# Patient Record
Sex: Male | Born: 1950 | Race: White | Hispanic: No | Smoking: Never smoker
Health system: Southern US, Community
[De-identification: ages and names within clinical notes are randomized; demographics above are authoritative.]

## PROBLEM LIST (undated history)

## (undated) ENCOUNTER — Emergency Department: Payer: 59

## (undated) DIAGNOSIS — N400 Enlarged prostate without lower urinary tract symptoms: Secondary | ICD-10-CM

## (undated) DIAGNOSIS — G20 Parkinson's disease: Secondary | ICD-10-CM

## (undated) DIAGNOSIS — G20A1 Parkinson's disease without dyskinesia, without mention of fluctuations: Secondary | ICD-10-CM

## (undated) DIAGNOSIS — I429 Cardiomyopathy, unspecified: Secondary | ICD-10-CM

## (undated) DIAGNOSIS — I2510 Atherosclerotic heart disease of native coronary artery without angina pectoris: Secondary | ICD-10-CM

## (undated) DIAGNOSIS — E039 Hypothyroidism, unspecified: Secondary | ICD-10-CM

## (undated) DIAGNOSIS — I10 Essential (primary) hypertension: Secondary | ICD-10-CM

## (undated) DIAGNOSIS — I4891 Unspecified atrial fibrillation: Secondary | ICD-10-CM

## (undated) HISTORY — PX: CORONARY ANGIOPLASTY: SHX604

---

## 2005-01-13 ENCOUNTER — Ambulatory Visit: Payer: Self-pay | Admitting: Gastroenterology

## 2006-10-26 ENCOUNTER — Ambulatory Visit: Payer: Self-pay | Admitting: Internal Medicine

## 2007-05-11 ENCOUNTER — Other Ambulatory Visit: Payer: Self-pay

## 2007-05-11 ENCOUNTER — Observation Stay: Payer: Self-pay | Admitting: Internal Medicine

## 2007-05-22 ENCOUNTER — Ambulatory Visit: Payer: Self-pay | Admitting: Internal Medicine

## 2007-05-24 ENCOUNTER — Ambulatory Visit: Payer: Self-pay | Admitting: Internal Medicine

## 2010-02-03 ENCOUNTER — Ambulatory Visit: Payer: Self-pay | Admitting: Gastroenterology

## 2014-07-10 ENCOUNTER — Ambulatory Visit: Payer: Self-pay | Admitting: Ophthalmology

## 2015-03-13 ENCOUNTER — Ambulatory Visit: Admission: RE | Admit: 2015-03-13 | Payer: Self-pay | Source: Ambulatory Visit | Admitting: Unknown Physician Specialty

## 2015-03-13 ENCOUNTER — Encounter: Admission: RE | Payer: Self-pay | Source: Ambulatory Visit

## 2015-03-13 SURGERY — COLONOSCOPY WITH PROPOFOL
Anesthesia: Monitor Anesthesia Care

## 2015-04-09 ENCOUNTER — Encounter: Payer: Self-pay | Admitting: *Deleted

## 2015-04-10 ENCOUNTER — Ambulatory Visit
Admission: RE | Admit: 2015-04-10 | Discharge: 2015-04-10 | Disposition: A | Discharge status: Home / Self Care | Payer: 59 | Source: Ambulatory Visit | Attending: Unknown Physician Specialty | Admitting: Unknown Physician Specialty

## 2015-04-10 ENCOUNTER — Encounter: Payer: Self-pay | Admitting: *Deleted

## 2015-04-10 ENCOUNTER — Ambulatory Visit: Payer: 59 | Admitting: Anesthesiology

## 2015-04-10 ENCOUNTER — Encounter
Admission: RE | Disposition: A | Discharge status: Home / Self Care | Payer: Self-pay | Source: Ambulatory Visit | Attending: Unknown Physician Specialty

## 2015-04-10 DIAGNOSIS — Z86010 Personal history of colonic polyps: Secondary | ICD-10-CM | POA: Diagnosis not present

## 2015-04-10 DIAGNOSIS — Z7901 Long term (current) use of anticoagulants: Secondary | ICD-10-CM | POA: Diagnosis not present

## 2015-04-10 DIAGNOSIS — I2510 Atherosclerotic heart disease of native coronary artery without angina pectoris: Secondary | ICD-10-CM | POA: Diagnosis not present

## 2015-04-10 DIAGNOSIS — E039 Hypothyroidism, unspecified: Secondary | ICD-10-CM | POA: Diagnosis not present

## 2015-04-10 DIAGNOSIS — Z79899 Other long term (current) drug therapy: Secondary | ICD-10-CM | POA: Diagnosis not present

## 2015-04-10 DIAGNOSIS — Z1211 Encounter for screening for malignant neoplasm of colon: Secondary | ICD-10-CM | POA: Insufficient documentation

## 2015-04-10 DIAGNOSIS — D122 Benign neoplasm of ascending colon: Secondary | ICD-10-CM | POA: Insufficient documentation

## 2015-04-10 DIAGNOSIS — I10 Essential (primary) hypertension: Secondary | ICD-10-CM | POA: Insufficient documentation

## 2015-04-10 DIAGNOSIS — I4891 Unspecified atrial fibrillation: Secondary | ICD-10-CM | POA: Insufficient documentation

## 2015-04-10 DIAGNOSIS — D123 Benign neoplasm of transverse colon: Secondary | ICD-10-CM | POA: Insufficient documentation

## 2015-04-10 DIAGNOSIS — K648 Other hemorrhoids: Secondary | ICD-10-CM | POA: Insufficient documentation

## 2015-04-10 HISTORY — DX: Essential (primary) hypertension: I10

## 2015-04-10 HISTORY — PX: COLONOSCOPY: SHX5424

## 2015-04-10 HISTORY — DX: Atherosclerotic heart disease of native coronary artery without angina pectoris: I25.10

## 2015-04-10 SURGERY — COLONOSCOPY
Anesthesia: General

## 2015-04-10 MED ORDER — PHENYLEPHRINE HCL 10 MG/ML IJ SOLN
INTRAMUSCULAR | Status: DC | PRN
  Administered 2015-04-10 (×2): 100 ug via INTRAVENOUS
  Administered 2015-04-10: 200 ug via INTRAVENOUS

## 2015-04-10 MED ORDER — PROPOFOL INFUSION 10 MG/ML OPTIME
INTRAVENOUS | Status: DC | PRN
  Administered 2015-04-10: 100 ug/kg/min via INTRAVENOUS

## 2015-04-10 MED ORDER — PROPOFOL 10 MG/ML IV BOLUS
INTRAVENOUS | Status: DC | PRN
  Administered 2015-04-10: 50 mg via INTRAVENOUS

## 2015-04-10 MED ORDER — LIDOCAINE HCL (CARDIAC) 20 MG/ML IV SOLN
INTRAVENOUS | Status: DC | PRN
  Administered 2015-04-10: 50 mg via INTRAVENOUS

## 2015-04-10 MED ORDER — SODIUM CHLORIDE 0.9 % IV SOLN
INTRAVENOUS | Status: DC
  Administered 2015-04-10: 15:00:00 via INTRAVENOUS

## 2015-04-10 NOTE — Anesthesia Postprocedure Evaluation (Signed)
  Anesthesia Post-op Note  Patient: Douglas Silva.  Procedure(s) Performed: Procedure(s): COLONOSCOPY (N/A)  Anesthesia type:General  Patient location: PACU  Post pain: Pain level controlled  Post assessment: Post-op Vital signs reviewed, Patient's Cardiovascular Status Stable, Respiratory Function Stable, Patent Airway and No signs of Nausea or vomiting  Post vital signs: Reviewed and stable  Last Vitals:  Filed Vitals:   04/10/15 1620  BP: 110/69  Pulse: 88  Temp:   Resp: 18    Level of consciousness: awake, alert  and patient cooperative  Complications: No apparent anesthesia complications

## 2015-04-10 NOTE — Anesthesia Preprocedure Evaluation (Addendum)
Anesthesia Evaluation  Patient identified by MRN, date of birth, ID band Patient awake    Reviewed: Allergy & Precautions, NPO status , Patient's Chart, lab work & pertinent test results, reviewed documented beta blocker date and time   Airway Mallampati: II  TM Distance: >3 FB     Dental  (+) Chipped   Pulmonary          Cardiovascular hypertension, Pt. on medications + CAD + dysrhythmias Atrial Fibrillation     Neuro/Psych    GI/Hepatic   Endo/Other  Hypothyroidism   Renal/GU      Musculoskeletal   Abdominal   Peds  Hematology   Anesthesia Other Findings   Reproductive/Obstetrics                           Anesthesia Physical Anesthesia Plan  ASA: III  Anesthesia Plan: General   Post-op Pain Management:    Induction: Intravenous  Airway Management Planned: Nasal Cannula  Additional Equipment:   Intra-op Plan:   Post-operative Plan:   Informed Consent: I have reviewed the patients History and Physical, chart, labs and discussed the procedure including the risks, benefits and alternatives for the proposed anesthesia with the patient or authorized representative who has indicated his/her understanding and acceptance.     Plan Discussed with: CRNA  Anesthesia Plan Comments:         Anesthesia Quick Evaluation

## 2015-04-10 NOTE — Op Note (Signed)
[facility] Patient Name: Douglas Silva Procedure Date: 04/10/2015 2:54 PM MRN: [id number] Account #: [id number] Date of Birth: Aug 02, 1951 Admit Type: Outpatient Age: 64 Room: [facility] ENDO ROOM 1 Gender: Male Note Status: Finalized Procedure:         Colonoscopy Indications:       Follow-up for history of adenomatous polyps in the colon Providers:         Manya Silvas, MD Referring MD:      Mikeal Hawthorne. Brynda Greathouse, MD (Referring MD) Medicines:         Propofol per Anesthesia Complications:     No immediate complications. Procedure:         Pre-Anesthesia Assessment:                    - After reviewing the risks and benefits, the patient was                     deemed in satisfactory condition to undergo the procedure.                    After obtaining informed consent, the colonoscope was                     passed under direct vision. Throughout the procedure, the                     patient's blood pressure, pulse, and oxygen saturations                     were monitored continuously. The Olympus PCF-160AL                     colonoscope (S#. [id number]) was introduced through the anus                     and advanced to the the cecum, identified by appendiceal                     orifice and ileocecal valve. The colonoscopy was performed                     without difficulty. The patient tolerated the procedure                     well. The quality of the bowel preparation was good. Findings:      Three sessile polyps were found at the hepatic flexure, in the proximal       ascending colon and in the distal ascending colon. The polyps were small       in size. These polyps were removed with a hot snare. Resection and       retrieval were complete. To prevent bleeding after the polypectomy, four       hemostatic clips were successfully placed. There was no bleeding at the       end of the procedure.      Internal hemorrhoids were found during  endoscopy. The hemorrhoids were       medium-sized and Grade I (internal hemorrhoids that do not prolapse).      The exam was otherwise without abnormality. Impression:        - Three small polyps at the hepatic flexure, in the  proximal ascending colon and in the distal ascending                     colon. Resected and retrieved. Clips were placed. Recommendation:    - Await pathology results. Manya Silvas, MD 04/10/2015 3:54:10 PM This report has been signed electronically. Number of Addenda: 0 Note Initiated On: 04/10/2015 2:54 PM Scope Withdrawal Time: 0 hours 27 minutes 13 seconds  Total Procedure Duration: 0 hours 34 minutes 51 seconds       [facility]

## 2015-04-10 NOTE — H&P (Signed)
Primary Care Physician:  [name], MD Primary Gastroenterologist:  Dr. Vira Agar  Pre-Procedure History & Physical: HPI:  Douglas Silva. is a 64 y.o. male is here for an colonoscopy.   Past Medical History  Diagnosis Date  . Hypertension   . Coronary artery disease     Past Surgical History  Procedure Laterality Date  . Coronary angioplasty      Prior to Admission medications   Medication Sig Start Date End Date Taking? Authorizing Provider  acetaminophen (TYLENOL) 325 MG tablet Take 325 mg by mouth every 6 (six) hours as needed for mild pain.   Yes Historical Provider, MD  benazepril (LOTENSIN) 5 MG tablet Take 5 mg by mouth daily.   Yes Historical Provider, MD  levothyroxine (SYNTHROID, LEVOTHROID) 100 MCG tablet Take 100 mcg by mouth daily before breakfast.   Yes Historical Provider, MD  metoprolol succinate (TOPROL-XL) 100 MG 24 hr tablet Take 100 mg by mouth daily. Take with or immediately following a meal.   Yes Historical Provider, MD  polyethylene glycol powder (GLYCOLAX/MIRALAX) powder TAKE 255 G BY MOUTH ONCE. 02/19/15  Yes Historical Provider, MD  simvastatin (ZOCOR) 20 MG tablet Take 20 mg by mouth daily.   Yes Historical Provider, MD  warfarin (COUMADIN) 5 MG tablet Take 5 mg by mouth daily. @@1700    Yes Historical Provider, MD    Allergies as of 03/04/2015  . (Not on File)    History reviewed. No pertinent family history.  History   Social History  . Marital Status: Single    Spouse Name: N/A  . Number of Children: N/A  . Years of Education: N/A   Occupational History  . Not on file.   Social History Main Topics  . Smoking status: Never Smoker   . Smokeless tobacco: Never Used  . Alcohol Use: No  . Drug Use: No  . Sexual Activity: Not on file   Other Topics Concern  . Not on file   Social History Narrative    Review of Systems: See HPI, otherwise negative ROS  Physical Exam: BP 116/65 mmHg  Pulse 62  Temp(Src) 99.2 F (37.3  C) (Tympanic)  Resp 18  Ht 5\' 11"  (1.803 m)  Wt 72.576 kg (160 lb)  BMI 22.33 kg/m2  SpO2 98% General:   Alert,  pleasant and cooperative in NAD Head:  Normocephalic and atraumatic. Neck:  Supple; no masses or thyromegaly. Lungs:  Clear throughout to auscultation.    Heart:  Regular rate and rhythm. Abdomen:  Soft, nontender and nondistended. Normal bowel sounds, without guarding, and without rebound.   Neurologic:  Alert and  oriented x4;  grossly normal neurologically.  Impression/Plan: [name]. is here for an colonoscopy to be performed for personal history of colon polyps  Risks, benefits, limitations, and alternatives regarding  colonoscopy have been reviewed with the patient.  Questions have been answered.  All parties agreeable.   Gaylyn Cheers, MD  04/10/2015, 3:04 PM   Primary Care Physician:  [name], MD Primary Gastroenterologist:  Dr. Vira Agar  Pre-Procedure History & Physical: HPI:  Douglas Silva. is a 64 y.o. male is here for an colonoscopy.   Past Medical History  Diagnosis Date  . Hypertension   . Coronary artery disease     Past Surgical History  Procedure Laterality Date  . Coronary angioplasty      Prior to Admission medications   Medication Sig Start Date End Date Taking?  Authorizing Provider  acetaminophen (TYLENOL) 325 MG tablet Take 325 mg by mouth every 6 (six) hours as needed for mild pain.   Yes Historical Provider, MD  benazepril (LOTENSIN) 5 MG tablet Take 5 mg by mouth daily.   Yes Historical Provider, MD  levothyroxine (SYNTHROID, LEVOTHROID) 100 MCG tablet Take 100 mcg by mouth daily before breakfast.   Yes Historical Provider, MD  metoprolol succinate (TOPROL-XL) 100 MG 24 hr tablet Take 100 mg by mouth daily. Take with or immediately following a meal.   Yes Historical Provider, MD  polyethylene glycol powder (GLYCOLAX/MIRALAX) powder TAKE 255 G BY MOUTH ONCE. 02/19/15  Yes Historical Provider, MD  simvastatin  (ZOCOR) 20 MG tablet Take 20 mg by mouth daily.   Yes Historical Provider, MD  warfarin (COUMADIN) 5 MG tablet Take 5 mg by mouth daily. @@1700    Yes Historical Provider, MD    Allergies as of 03/04/2015  . (Not on File)    History reviewed. No pertinent family history.  History   Social History  . Marital Status: Single    Spouse Name: N/A  . Number of Children: N/A  . Years of Education: N/A   Occupational History  . Not on file.   Social History Main Topics  . Smoking status: Never Smoker   . Smokeless tobacco: Never Used  . Alcohol Use: No  . Drug Use: No  . Sexual Activity: Not on file   Other Topics Concern  . Not on file   Social History Narrative    Review of Systems: See HPI, otherwise negative ROS  Physical Exam: BP 116/65 mmHg  Pulse 62  Temp(Src) 99.2 F (37.3 C) (Tympanic)  Resp 18  Ht 5\' 11"  (1.803 m)  Wt 72.576 kg (160 lb)  BMI 22.33 kg/m2  SpO2 98% General:   Alert,  pleasant and cooperative in NAD Head:  Normocephalic and atraumatic. Neck:  Supple; no masses or thyromegaly. Lungs:  Clear throughout to auscultation.    Heart:  Somewhat irregular heart beat Abdomen:  Soft, nontender and nondistended. Normal bowel sounds, without guarding, and without rebound.   Neurologic:  Alert and  oriented x4;  grossly normal neurologically.  Impression/Plan: [name]. is here for an colonoscopy to be performed for personal history of colon polyps  Risks, benefits, limitations, and alternatives regarding  colonoscopy have been reviewed with the patient.  Questions have been answered.  All parties agreeable.   Gaylyn Cheers, MD  04/10/2015, 3:04 PM

## 2015-04-10 NOTE — Transfer of Care (Signed)
Immediate Anesthesia Transfer of Care Note  Patient: Douglas Silva.  Procedure(s) Performed: Procedure(s): COLONOSCOPY (N/A)  Patient Location: Endoscopy Unit  Anesthesia Type:General  Level of Consciousness: awake, alert , oriented and patient cooperative  Airway & Oxygen Therapy: Patient Spontanous Breathing and Patient connected to nasal cannula oxygen  Post-op Assessment: Report given to RN, Post -op Vital signs reviewed and stable and Patient moving all extremities X 4  Post vital signs: Reviewed and stable  Last Vitals:  Filed Vitals:   04/10/15 1601  BP: 90/61  Pulse: 97  Temp: 35.8 C  Resp:     Complications: No apparent anesthesia complications

## 2015-04-14 LAB — SURGICAL PATHOLOGY

## 2015-04-21 ENCOUNTER — Encounter: Payer: Self-pay | Admitting: Unknown Physician Specialty

## 2015-05-27 ENCOUNTER — Encounter
Admission: RE | Admit: 2015-05-27 | Discharge: 2015-05-27 | Disposition: A | Discharge status: Home / Self Care | Payer: 59 | Source: Ambulatory Visit | Attending: Internal Medicine | Admitting: Internal Medicine

## 2015-05-27 DIAGNOSIS — E039 Hypothyroidism, unspecified: Secondary | ICD-10-CM | POA: Insufficient documentation

## 2015-05-27 DIAGNOSIS — I482 Chronic atrial fibrillation: Secondary | ICD-10-CM | POA: Insufficient documentation

## 2015-05-27 DIAGNOSIS — R410 Disorientation, unspecified: Secondary | ICD-10-CM | POA: Insufficient documentation

## 2015-05-29 DIAGNOSIS — E039 Hypothyroidism, unspecified: Secondary | ICD-10-CM | POA: Diagnosis not present

## 2015-05-29 DIAGNOSIS — I482 Chronic atrial fibrillation: Secondary | ICD-10-CM | POA: Diagnosis present

## 2015-05-29 DIAGNOSIS — R410 Disorientation, unspecified: Secondary | ICD-10-CM | POA: Diagnosis not present

## 2015-05-29 LAB — PROTIME-INR
INR: 1.56
PROTHROMBIN TIME: 18.9 s — AB (ref 11.4–15.0)

## 2015-05-31 ENCOUNTER — Other Ambulatory Visit
Admission: RE | Admit: 2015-05-31 | Discharge: 2015-05-31 | Disposition: A | Discharge status: Home / Self Care | Payer: 59 | Source: Ambulatory Visit | Attending: Internal Medicine | Admitting: Internal Medicine

## 2015-05-31 DIAGNOSIS — I4891 Unspecified atrial fibrillation: Secondary | ICD-10-CM | POA: Insufficient documentation

## 2015-05-31 LAB — PROTIME-INR
INR: 1.86
PROTHROMBIN TIME: 21.6 s — AB (ref 11.4–15.0)

## 2015-06-04 DIAGNOSIS — I482 Chronic atrial fibrillation: Secondary | ICD-10-CM | POA: Diagnosis not present

## 2015-06-04 LAB — PROTIME-INR
INR: 2.41
PROTHROMBIN TIME: 26.4 s — AB (ref 11.4–15.0)

## 2015-06-09 ENCOUNTER — Other Ambulatory Visit: Payer: Self-pay | Admitting: Gerontology

## 2015-06-09 ENCOUNTER — Ambulatory Visit
Admission: RE | Admit: 2015-06-09 | Discharge: 2015-06-09 | Disposition: A | Discharge status: Home / Self Care | Payer: 59 | Source: Ambulatory Visit | Attending: Gerontology | Admitting: Gerontology

## 2015-06-09 DIAGNOSIS — R410 Disorientation, unspecified: Secondary | ICD-10-CM | POA: Insufficient documentation

## 2015-06-09 DIAGNOSIS — S0012XD Contusion of left eyelid and periocular area, subsequent encounter: Secondary | ICD-10-CM | POA: Insufficient documentation

## 2015-06-09 DIAGNOSIS — R451 Restlessness and agitation: Secondary | ICD-10-CM

## 2015-06-10 DIAGNOSIS — I482 Chronic atrial fibrillation: Secondary | ICD-10-CM | POA: Diagnosis not present

## 2015-06-10 LAB — COMPREHENSIVE METABOLIC PANEL
ALT: 14 U/L — AB (ref 17–63)
AST: 18 U/L (ref 15–41)
Albumin: 3.4 g/dL — ABNORMAL LOW (ref 3.5–5.0)
Alkaline Phosphatase: 82 U/L (ref 38–126)
Anion gap: 8 (ref 5–15)
BUN: 18 mg/dL (ref 6–20)
CALCIUM: 9.1 mg/dL (ref 8.9–10.3)
CO2: 30 mmol/L (ref 22–32)
CREATININE: 0.93 mg/dL (ref 0.61–1.24)
Chloride: 103 mmol/L (ref 101–111)
GFR calc Af Amer: >60 mL/min (ref >60)
GFR calc non Af Amer: >60 mL/min (ref >60)
Glucose, Bld: 81 mg/dL (ref 65–99)
Potassium: 3.9 mmol/L (ref 3.5–5.1)
Sodium: 141 mmol/L (ref 135–145)
TOTAL PROTEIN: 6.6 g/dL (ref 6.5–8.1)
Total Bilirubin: 1.1 mg/dL (ref 0.3–1.2)

## 2015-06-10 LAB — CBC WITH DIFFERENTIAL/PLATELET
Basophils Absolute: 0.1 10*3/uL (ref 0–0.1)
Basophils Relative: 1 %
Eosinophils Absolute: 0.1 10*3/uL (ref 0–0.7)
Eosinophils Relative: 1 %
HCT: 36.1 % — ABNORMAL LOW (ref 40.0–52.0)
HEMOGLOBIN: 11.7 g/dL — AB (ref 13.0–18.0)
LYMPHS PCT: 25 %
Lymphs Abs: 2.1 10*3/uL (ref 1.0–3.6)
MCH: 31.4 pg (ref 26.0–34.0)
MCHC: 32.5 g/dL (ref 32.0–36.0)
MCV: 96.4 fL (ref 80.0–100.0)
MONOS PCT: 11 %
Monocytes Absolute: 0.9 10*3/uL (ref 0.2–1.0)
Neutro Abs: 5.5 10*3/uL (ref 1.4–6.5)
Neutrophils Relative %: 62 %
PLATELETS: 337 10*3/uL (ref 150–440)
RBC: 3.74 MIL/uL — AB (ref 4.40–5.90)
RDW: 15 % — ABNORMAL HIGH (ref 11.5–14.5)
WBC: 8.7 10*3/uL (ref 3.8–10.6)

## 2015-06-10 LAB — PROTIME-INR
INR: 1.17
Prothrombin Time: 15.1 seconds — ABNORMAL HIGH (ref 11.4–15.0)

## 2015-06-10 LAB — TSH: TSH: 2.39 u[IU]/mL (ref 0.350–4.500)

## 2015-06-10 LAB — LITHIUM LEVEL

## 2015-06-10 LAB — VITAMIN B12: Vitamin B-12: 250 pg/mL (ref 180–914)

## 2015-06-11 DIAGNOSIS — I482 Chronic atrial fibrillation: Secondary | ICD-10-CM | POA: Diagnosis not present

## 2015-06-11 LAB — GLUCOSE, CAPILLARY: Glucose-Capillary: 105 mg/dL — ABNORMAL HIGH (ref 65–99)

## 2015-06-15 LAB — VITAMIN D 1,25 DIHYDROXY
Vitamin D 1, 25 (OH)2 Total: 43 pg/mL
Vitamin D2 1, 25 (OH)2: <10 pg/mL
Vitamin D3 1, 25 (OH)2: 43 pg/mL

## 2015-06-19 DIAGNOSIS — I482 Chronic atrial fibrillation: Secondary | ICD-10-CM | POA: Diagnosis not present

## 2015-06-19 LAB — PROTIME-INR
INR: 0.89
PROTHROMBIN TIME: 12.2 s (ref 11.4–15.0)

## 2015-06-21 ENCOUNTER — Other Ambulatory Visit
Admission: RE | Admit: 2015-06-21 | Discharge: 2015-06-21 | Disposition: A | Discharge status: Home / Self Care | Payer: 59 | Source: Ambulatory Visit | Attending: Gerontology | Admitting: Gerontology

## 2015-06-21 DIAGNOSIS — I482 Chronic atrial fibrillation: Secondary | ICD-10-CM | POA: Insufficient documentation

## 2015-06-21 LAB — PROTIME-INR
INR: 0.96
Prothrombin Time: 13 seconds (ref 11.4–15.0)

## 2015-06-24 DIAGNOSIS — I482 Chronic atrial fibrillation: Secondary | ICD-10-CM | POA: Diagnosis not present

## 2015-06-24 LAB — PROTIME-INR
INR: 1.49
Prothrombin Time: 18.2 seconds — ABNORMAL HIGH (ref 11.4–15.0)

## 2015-06-26 ENCOUNTER — Encounter
Admission: RE | Admit: 2015-06-26 | Discharge: 2015-06-26 | Disposition: A | Discharge status: Home / Self Care | Payer: 59 | Source: Ambulatory Visit | Attending: Internal Medicine | Admitting: Internal Medicine

## 2015-06-26 ENCOUNTER — Other Ambulatory Visit: Payer: Self-pay | Admitting: Gerontology

## 2015-06-26 DIAGNOSIS — R410 Disorientation, unspecified: Secondary | ICD-10-CM | POA: Insufficient documentation

## 2015-06-26 DIAGNOSIS — E039 Hypothyroidism, unspecified: Secondary | ICD-10-CM | POA: Insufficient documentation

## 2015-06-26 DIAGNOSIS — I482 Chronic atrial fibrillation: Secondary | ICD-10-CM | POA: Diagnosis not present

## 2015-06-26 LAB — PROTIME-INR
INR: 2.02
PROTHROMBIN TIME: 23 s — AB (ref 11.4–15.0)

## 2015-06-27 ENCOUNTER — Ambulatory Visit
Admission: RE | Admit: 2015-06-27 | Discharge: 2015-06-27 | Disposition: A | Discharge status: Home / Self Care | Payer: 59 | Source: Ambulatory Visit | Attending: Gerontology | Admitting: Gerontology

## 2015-06-27 DIAGNOSIS — R410 Disorientation, unspecified: Secondary | ICD-10-CM

## 2015-06-27 DIAGNOSIS — S0990XD Unspecified injury of head, subsequent encounter: Secondary | ICD-10-CM | POA: Diagnosis not present

## 2015-06-27 DIAGNOSIS — X58XXXD Exposure to other specified factors, subsequent encounter: Secondary | ICD-10-CM | POA: Diagnosis not present

## 2015-06-28 ENCOUNTER — Other Ambulatory Visit
Admission: RE | Admit: 2015-06-28 | Discharge: 2015-06-28 | Disposition: A | Discharge status: Home / Self Care | Payer: 59 | Source: Ambulatory Visit | Attending: Gerontology | Admitting: Gerontology

## 2015-06-28 DIAGNOSIS — I482 Chronic atrial fibrillation: Secondary | ICD-10-CM | POA: Diagnosis present

## 2015-06-28 LAB — PROTIME-INR
INR: 2.37
Prothrombin Time: 26 seconds — ABNORMAL HIGH (ref 11.4–15.0)

## 2017-05-29 ENCOUNTER — Emergency Department
Admission: EM | Admit: 2017-05-29 | Discharge: 2017-05-29 | Disposition: A | Discharge status: Home / Self Care | Payer: 59 | Attending: Emergency Medicine | Admitting: Emergency Medicine

## 2017-05-29 ENCOUNTER — Emergency Department: Payer: 59

## 2017-05-29 ENCOUNTER — Encounter: Payer: Self-pay | Admitting: Emergency Medicine

## 2017-05-29 DIAGNOSIS — M7989 Other specified soft tissue disorders: Secondary | ICD-10-CM | POA: Insufficient documentation

## 2017-05-29 DIAGNOSIS — Z79899 Other long term (current) drug therapy: Secondary | ICD-10-CM | POA: Insufficient documentation

## 2017-05-29 DIAGNOSIS — Z7901 Long term (current) use of anticoagulants: Secondary | ICD-10-CM | POA: Diagnosis not present

## 2017-05-29 DIAGNOSIS — I10 Essential (primary) hypertension: Secondary | ICD-10-CM | POA: Insufficient documentation

## 2017-05-29 DIAGNOSIS — M25562 Pain in left knee: Secondary | ICD-10-CM | POA: Insufficient documentation

## 2017-05-29 DIAGNOSIS — R229 Localized swelling, mass and lump, unspecified: Secondary | ICD-10-CM

## 2017-05-29 DIAGNOSIS — I2510 Atherosclerotic heart disease of native coronary artery without angina pectoris: Secondary | ICD-10-CM | POA: Insufficient documentation

## 2017-05-29 NOTE — ED Triage Notes (Addendum)
Pt assisted out of the car, has hx of parkinsons, and tripped while walking on Tuesday and fell on to left knee. Abrasion noted to the left knee. Pt reports pain with ambulation.

## 2017-05-29 NOTE — ED Notes (Signed)
Patient transported to X-ray 

## 2017-05-29 NOTE — ED Provider Notes (Signed)
[facility] Emergency Department Provider Note  ____________________________________________  Time seen: Approximately 7:59 AM  I have reviewed the triage vital signs and the nursing notes.   HISTORY  Chief Complaint Knee Pain    HPI Douglas Silva. is a 66 y.o. male that presents to the emergency department with left knee pain for 5 days. Patient fellwhile walking on Tuesday. He has continued to walk on leg since fall. He can bend his knee normally but with pain. Swelling has been improving but has not resolved. He did not hit his head or lose consciousness. He has not taken anything for pain because he is concerned that it will interact with his Parkinson's medications. He denies shortness of breath, chest pain, nausea, vomiting, abdominal pain, calf pain, numbness, tingling.   Past Medical History:  Diagnosis Date  . Coronary artery disease   . Hypertension     There are no active problems to display for this patient.   Past Surgical History:  Procedure Laterality Date  . COLONOSCOPY N/A 04/10/2015   Procedure: COLONOSCOPY;  Surgeon: Manya Silvas, MD;  Location: [facility] ENDOSCOPY;  Service: Endoscopy;  Laterality: N/A;  . CORONARY ANGIOPLASTY      Prior to Admission medications   Medication Sig Start Date End Date Taking? Authorizing Provider  acetaminophen (TYLENOL) 325 MG tablet Take 325 mg by mouth every 6 (six) hours as needed for mild pain.    [provider]  benazepril (LOTENSIN) 5 MG tablet Take 5 mg by mouth daily.    [provider]  levothyroxine (SYNTHROID, LEVOTHROID) 100 MCG tablet Take 100 mcg by mouth daily before breakfast.    [provider]  metoprolol succinate (TOPROL-XL) 100 MG 24 hr tablet Take 100 mg by mouth daily. Take with or immediately following a meal.    [provider]  polyethylene glycol powder (GLYCOLAX/MIRALAX) powder TAKE 255 G BY MOUTH ONCE. 02/19/15   [provider]  simvastatin (ZOCOR) 20 MG tablet Take 20 mg by mouth daily.    [provider]  warfarin (COUMADIN) 5 MG tablet Take 5 mg by mouth daily. @@1700     [provider]    Allergies Patient has no known allergies.  History reviewed. No pertinent family history.  Social History Social History  Substance Use Topics  . Smoking status: Never Smoker  . Smokeless tobacco: Never Used  . Alcohol use No     Review of Systems  Constitutional: No fever/chills ENT: No upper respiratory complaints. Cardiovascular: No chest pain. Respiratory: No SOB. Gastrointestinal: No abdominal pain.  No nausea, no vomiting.  Musculoskeletal: Positive for knee pain. Skin: Negative for lacerations, ecchymosis. Positive for abrasion. Neurological: Negative for headaches, numbness or tingling   ____________________________________________   PHYSICAL EXAM:  VITAL SIGNS: ED Triage Vitals  Enc Vitals Group     BP 05/29/17 0746 96/66     Pulse Rate 05/29/17 0746 65     Resp 05/29/17 0746 18     Temp 05/29/17 0746 97.7 F (36.5 C)     Temp Source 05/29/17 0746 Oral     SpO2 05/29/17 0746 99 %     Weight 05/29/17 0746 155 lb (70.3 kg)     Height 05/29/17 0746 6' (1.829 m)     Head Circumference --      Peak Flow --      Pain Score 05/29/17 0747 0     Pain Loc --      Pain Edu? --  Excl. in [place]? --      Constitutional: Alert and oriented. Well appearing and in no acute distress. Eyes: Conjunctivae are normal. PERRL. EOMI. Head: Atraumatic. ENT:      Ears:      Nose: No congestion/rhinnorhea.      Mouth/Throat: Mucous membranes are moist.  Neck: No stridor.   Cardiovascular: Normal rate, regular rhythm.  Good peripheral circulation. Respiratory: Normal respiratory effort without tachypnea or retractions. Lungs CTAB. Good air entry to the bases with no decreased or absent breath sounds. Gastrointestinal: Bowel sounds 4 quadrants. Soft and nontender to  palpation. No guarding or rigidity. No palpable masses. No distention.  Musculoskeletal: Full range of motion to all extremities. No gross deformities appreciated. Moderate swelling inferior to patella with overlying superficial abrasions. No surrounding erythema. Warm to touch. Compartments are soft. Neurologic:  Normal speech and language.  Skin:  Skin is warm, dry.      ____________________________________________   LABS (all labs ordered are listed, but only abnormal results are displayed)  Labs Reviewed - No data to display ____________________________________________  EKG   ____________________________________________  RADIOLOGY Robinette Haines, personally viewed and evaluated these images (plain radiographs) as part of my medical decision making, as well as reviewing the written report by the radiologist.  Dg Knee Complete 4 Views Left  Result Date: 05/29/2017 CLINICAL DATA:  Fall, multiple abrasions. EXAM: LEFT KNEE - COMPLETE 4+ VIEW COMPARISON:  None. FINDINGS: No fracture of the proximal tibia or distal femur. Patella is normal. No joint effusion. Soft tissue tissue swelling anterior to the the tibial tuberosity. IMPRESSION: 1. No fracture dislocation.  No joint effusion. 2. Soft tissue swelling inferior to the patella. Electronically Signed   By: Suzy Bouchard M.D.   On: 05/29/2017 08:51    ____________________________________________    PROCEDURES  Procedure(s) performed:    Procedures    Medications - No data to display   ____________________________________________   INITIAL IMPRESSION / ASSESSMENT AND PLAN / ED COURSE  Pertinent labs & imaging results that were available during my care of the patient were reviewed by me and considered in my medical decision making (see chart for details).  Review of the [place] CSRS was performed in accordance of the [place] prior to dispensing any controlled drugs.   She presented to the emergency department for  evaluation of left knee pain and swelling for 5 days. Vital signs and exam are reassuring. X-ray consistent with soft tissue swelling and does not show any indication of acute bony abnormality. Knee was ace wrapped. Gilford Rile was provided. Brace precautions were given. Patient is going to follow up with his PCP tomorrow. Patient is to follow up with orthopedics as directed. Patient is given ED precautions to return to the ED for any worsening or new symptoms.  ____________________________________________  FINAL CLINICAL IMPRESSION(S) / ED DIAGNOSES  Final diagnoses:  Acute pain of left knee  Soft tissue swelling      NEW MEDICATIONS STARTED DURING THIS VISIT:  Discharge Medication List as of 05/29/2017  9:54 AM          This chart was dictated using voice recognition software/Dragon. Despite best efforts to proofread, errors can occur which can change the meaning. Any change was purely unintentional.    Laban Emperor, PA-C 05/29/17 1441    Schuyler Amor, MD 05/30/17 [phone]

## 2017-06-01 ENCOUNTER — Observation Stay
Admission: EM | Admit: 2017-06-01 | Discharge: 2017-06-03 | Disposition: A | Discharge status: Home / Self Care | Payer: 59 | Attending: Internal Medicine | Admitting: Internal Medicine

## 2017-06-01 ENCOUNTER — Encounter: Payer: Self-pay | Admitting: Internal Medicine

## 2017-06-01 DIAGNOSIS — R000 Tachycardia, unspecified: Secondary | ICD-10-CM

## 2017-06-01 DIAGNOSIS — L0390 Cellulitis, unspecified: Secondary | ICD-10-CM | POA: Diagnosis present

## 2017-06-01 DIAGNOSIS — Z7901 Long term (current) use of anticoagulants: Secondary | ICD-10-CM | POA: Insufficient documentation

## 2017-06-01 DIAGNOSIS — I2510 Atherosclerotic heart disease of native coronary artery without angina pectoris: Secondary | ICD-10-CM | POA: Diagnosis not present

## 2017-06-01 DIAGNOSIS — W19XXXA Unspecified fall, initial encounter: Secondary | ICD-10-CM | POA: Diagnosis not present

## 2017-06-01 DIAGNOSIS — E039 Hypothyroidism, unspecified: Secondary | ICD-10-CM | POA: Insufficient documentation

## 2017-06-01 DIAGNOSIS — Z9861 Coronary angioplasty status: Secondary | ICD-10-CM | POA: Diagnosis not present

## 2017-06-01 DIAGNOSIS — I10 Essential (primary) hypertension: Secondary | ICD-10-CM | POA: Diagnosis not present

## 2017-06-01 DIAGNOSIS — R791 Abnormal coagulation profile: Secondary | ICD-10-CM | POA: Insufficient documentation

## 2017-06-01 DIAGNOSIS — I429 Cardiomyopathy, unspecified: Secondary | ICD-10-CM | POA: Insufficient documentation

## 2017-06-01 DIAGNOSIS — E876 Hypokalemia: Secondary | ICD-10-CM | POA: Diagnosis not present

## 2017-06-01 DIAGNOSIS — N179 Acute kidney failure, unspecified: Secondary | ICD-10-CM | POA: Diagnosis present

## 2017-06-01 DIAGNOSIS — G20 Parkinson's disease: Secondary | ICD-10-CM | POA: Insufficient documentation

## 2017-06-01 DIAGNOSIS — L03116 Cellulitis of left lower limb: Principal | ICD-10-CM | POA: Insufficient documentation

## 2017-06-01 DIAGNOSIS — S8012XA Contusion of left lower leg, initial encounter: Secondary | ICD-10-CM | POA: Diagnosis not present

## 2017-06-01 DIAGNOSIS — Z801 Family history of malignant neoplasm of trachea, bronchus and lung: Secondary | ICD-10-CM | POA: Diagnosis not present

## 2017-06-01 DIAGNOSIS — Z79899 Other long term (current) drug therapy: Secondary | ICD-10-CM | POA: Insufficient documentation

## 2017-06-01 DIAGNOSIS — I4891 Unspecified atrial fibrillation: Secondary | ICD-10-CM | POA: Diagnosis not present

## 2017-06-01 DIAGNOSIS — Z9889 Other specified postprocedural states: Secondary | ICD-10-CM | POA: Diagnosis not present

## 2017-06-01 HISTORY — DX: Parkinson's disease without dyskinesia, without mention of fluctuations: G20.A1

## 2017-06-01 HISTORY — DX: Hypothyroidism, unspecified: E03.9

## 2017-06-01 HISTORY — DX: Parkinson's disease: G20

## 2017-06-01 HISTORY — DX: Cardiomyopathy, unspecified: I42.9

## 2017-06-01 HISTORY — DX: Unspecified atrial fibrillation: I48.91

## 2017-06-01 LAB — COMPREHENSIVE METABOLIC PANEL
ALBUMIN: 3.8 g/dL (ref 3.5–5.0)
ALT: 13 U/L — ABNORMAL LOW (ref 17–63)
ANION GAP: 8 (ref 5–15)
AST: 19 U/L (ref 15–41)
Alkaline Phosphatase: 62 U/L (ref 38–126)
BUN: 31 mg/dL — AB (ref 6–20)
CALCIUM: 9 mg/dL (ref 8.9–10.3)
CHLORIDE: 102 mmol/L (ref 101–111)
CO2: 28 mmol/L (ref 22–32)
Creatinine, Ser: 1.32 mg/dL — ABNORMAL HIGH (ref 0.61–1.24)
GFR calc Af Amer: >60 mL/min (ref >60)
GFR calc non Af Amer: 55 mL/min — ABNORMAL LOW (ref >60)
GLUCOSE: 94 mg/dL (ref 65–99)
POTASSIUM: 3.3 mmol/L — AB (ref 3.5–5.1)
SODIUM: 138 mmol/L (ref 135–145)
TOTAL PROTEIN: 6.7 g/dL (ref 6.5–8.1)
Total Bilirubin: 1.6 mg/dL — ABNORMAL HIGH (ref 0.3–1.2)

## 2017-06-01 LAB — CBC WITH DIFFERENTIAL/PLATELET
BASOS ABS: 0 10*3/uL (ref 0–0.1)
BASOS PCT: 0 %
EOS ABS: 0 10*3/uL (ref 0–0.7)
Eosinophils Relative: 0 %
HCT: 35.6 % — ABNORMAL LOW (ref 40.0–52.0)
Hemoglobin: 12.1 g/dL — ABNORMAL LOW (ref 13.0–18.0)
Lymphocytes Relative: 15 %
Lymphs Abs: 1.4 10*3/uL (ref 1.0–3.6)
MCH: 31.4 pg (ref 26.0–34.0)
MCHC: 33.9 g/dL (ref 32.0–36.0)
MCV: 92.5 fL (ref 80.0–100.0)
MONO ABS: 1.5 10*3/uL — AB (ref 0.2–1.0)
MONOS PCT: 16 %
NEUTROS ABS: 6.4 10*3/uL (ref 1.4–6.5)
NEUTROS PCT: 69 %
Platelets: 237 10*3/uL (ref 150–440)
RBC: 3.85 MIL/uL — ABNORMAL LOW (ref 4.40–5.90)
RDW: 13.4 % (ref 11.5–14.5)
WBC: 9.3 10*3/uL (ref 3.8–10.6)

## 2017-06-01 LAB — PROTIME-INR
INR: 4.31 — AB
Prothrombin Time: 42.5 seconds — ABNORMAL HIGH (ref 11.4–15.2)

## 2017-06-01 LAB — APTT: APTT: 70 s — AB (ref 24–36)

## 2017-06-01 LAB — LACTIC ACID, PLASMA: LACTIC ACID, VENOUS: 0.9 mmol/L (ref 0.5–1.9)

## 2017-06-01 MED ORDER — CEFAZOLIN SODIUM-DEXTROSE 1-4 GM/50ML-% IV SOLN
1.0000 g | Freq: Three times a day (TID) | INTRAVENOUS | Status: DC
  Administered 2017-06-02 – 2017-06-03 (×4): 1 g via INTRAVENOUS
  Filled 2017-06-01 (×7): qty 50

## 2017-06-01 MED ORDER — MORPHINE SULFATE (PF) 4 MG/ML IV SOLN
4.0000 mg | Freq: Once | INTRAVENOUS | Status: AC
  Administered 2017-06-01: 4 mg via INTRAVENOUS
  Filled 2017-06-01: qty 1

## 2017-06-01 MED ORDER — TETANUS-DIPHTH-ACELL PERTUSSIS 5-2.5-18.5 LF-MCG/0.5 IM SUSP
0.5000 mL | Freq: Once | INTRAMUSCULAR | Status: AC
  Administered 2017-06-01: 0.5 mL via INTRAMUSCULAR
  Filled 2017-06-01: qty 0.5

## 2017-06-01 MED ORDER — ONDANSETRON HCL 4 MG/2ML IJ SOLN
4.0000 mg | Freq: Once | INTRAMUSCULAR | Status: AC
  Administered 2017-06-01: 4 mg via INTRAVENOUS
  Filled 2017-06-01: qty 2

## 2017-06-01 MED ORDER — POTASSIUM CHLORIDE CRYS ER 20 MEQ PO TBCR
40.0000 meq | EXTENDED_RELEASE_TABLET | Freq: Once | ORAL | Status: AC
Start: 2017-06-01 — End: 2017-06-01
  Administered 2017-06-01: 40 meq via ORAL
  Filled 2017-06-01: qty 2

## 2017-06-01 MED ORDER — ACETAMINOPHEN 325 MG PO TABS
650.0000 mg | ORAL_TABLET | Freq: Four times a day (QID) | ORAL | Status: DC | PRN
  Administered 2017-06-02 (×2): 650 mg via ORAL
  Filled 2017-06-01 (×2): qty 2

## 2017-06-01 MED ORDER — SODIUM CHLORIDE 0.9 % IV BOLUS (SEPSIS)
1000.0000 mL | Freq: Once | INTRAVENOUS | Status: AC
  Administered 2017-06-01: 1000 mL via INTRAVENOUS

## 2017-06-01 MED ORDER — LEVOTHYROXINE SODIUM 50 MCG PO TABS
100.0000 ug | ORAL_TABLET | Freq: Every day | ORAL | Status: DC
  Administered 2017-06-02 – 2017-06-03 (×2): 100 ug via ORAL
  Filled 2017-06-01 (×2): qty 2

## 2017-06-01 MED ORDER — ACETAMINOPHEN 650 MG RE SUPP: 650.0000 mg | Freq: Four times a day (QID) | RECTAL | Status: DC | PRN

## 2017-06-01 MED ORDER — METOPROLOL SUCCINATE ER 50 MG PO TB24
25.0000 mg | ORAL_TABLET | Freq: Every day | ORAL | Status: DC
  Administered 2017-06-02 – 2017-06-03 (×2): 25 mg via ORAL
  Filled 2017-06-01 (×2): qty 1

## 2017-06-01 MED ORDER — SIMVASTATIN 10 MG PO TABS
20.0000 mg | ORAL_TABLET | Freq: Every day | ORAL | Status: DC
  Administered 2017-06-02 – 2017-06-03 (×2): 20 mg via ORAL
  Filled 2017-06-01 (×2): qty 2

## 2017-06-01 MED ORDER — VANCOMYCIN HCL IN DEXTROSE 1-5 GM/200ML-% IV SOLN
1000.0000 mg | Freq: Once | INTRAVENOUS | Status: AC
  Administered 2017-06-01: 1000 mg via INTRAVENOUS
  Filled 2017-06-01: qty 200

## 2017-06-01 MED ORDER — CEFAZOLIN SODIUM-DEXTROSE 1-4 GM/50ML-% IV SOLN
1.0000 g | Freq: Once | INTRAVENOUS | Status: AC
  Administered 2017-06-01: 1 g via INTRAVENOUS
  Filled 2017-06-01: qty 50

## 2017-06-01 NOTE — ED Notes (Signed)
Dr. Joni Fears notified of critical INR

## 2017-06-01 NOTE — ED Provider Notes (Addendum)
[facility] Emergency Department Provider Note  ____________________________________________  Time seen: Approximately 7:38 PM  I have reviewed the triage vital signs and the nursing notes.   HISTORY  Chief Complaint Cellulitis   HPI Douglas Silva. is a 66 y.o. male with a history of Parkinson's disease, A. Fib on Coumadin, hypertension hyperlipidemia who presents for evaluation of left lower extremity cellulitis. Patient sustained a fall about a week ago with abrasions to his left knee. Over the course of the last 2 days he has noted significant swelling, redness, warmth, and pain. The pain is dull and constant, 6/10, nonradiating, worse with movement of the leg. No fever, no chills, no nausea, no vomiting  Past Medical History:  Diagnosis Date  . Coronary artery disease   . Hypertension     There are no active problems to display for this patient.   Past Surgical History:  Procedure Laterality Date  . COLONOSCOPY N/A 04/10/2015   Procedure: COLONOSCOPY;  Surgeon: Manya Silvas, MD;  Location: [facility] ENDOSCOPY;  Service: Endoscopy;  Laterality: N/A;  . CORONARY ANGIOPLASTY      Prior to Admission medications   Medication Sig Start Date End Date Taking? Authorizing Provider  acetaminophen (TYLENOL) 325 MG tablet Take 325 mg by mouth every 6 (six) hours as needed for mild pain.    [provider]  benazepril (LOTENSIN) 5 MG tablet Take 5 mg by mouth daily.    [provider]  levothyroxine (SYNTHROID, LEVOTHROID) 100 MCG tablet Take 100 mcg by mouth daily before breakfast.    [provider]  metoprolol succinate (TOPROL-XL) 100 MG 24 hr tablet Take 100 mg by mouth daily. Take with or immediately following a meal.    [provider]  polyethylene glycol powder (GLYCOLAX/MIRALAX) powder TAKE 255 G BY MOUTH ONCE. 02/19/15   [provider]  simvastatin (ZOCOR) 20 MG tablet Take 20 mg by mouth daily.     [provider]  warfarin (COUMADIN) 5 MG tablet Take 5 mg by mouth daily. @@1700     [provider]    Allergies Patient has no known allergies.  No family history on file.  Social History Social History  Substance Use Topics  . Smoking status: Never Smoker  . Smokeless tobacco: Never Used  . Alcohol use No    Review of Systems  Constitutional: Negative for fever. Eyes: Negative for visual changes. ENT: Negative for sore throat. Neck: No neck pain  Cardiovascular: Negative for chest pain. Respiratory: Negative for shortness of breath. Gastrointestinal: Negative for abdominal pain, vomiting or diarrhea. Genitourinary: Negative for dysuria. Musculoskeletal: Negative for back pain. + LLE swelling, warmth and redness and pain Skin: Negative for rash. Neurological: Negative for headaches, weakness or numbness. Psych: No SI or HI  ____________________________________________   PHYSICAL EXAM:  VITAL SIGNS: 97.70F   115   104/75   16   99%RA  Constitutional: Alert and oriented. Well appearing and in no apparent distress. HEENT:      Head: Normocephalic and atraumatic.         Eyes: Conjunctivae are normal. Sclera is non-icteric.       Mouth/Throat: Mucous membranes are moist.       Neck: Supple with no signs of meningismus. Cardiovascular: Regular rate and rhythm. No murmurs, gallops, or rubs. 2+ symmetrical distal pulses are present in all extremities. No JVD. Respiratory: Normal respiratory effort. Lungs are clear to auscultation bilaterally. No wheezes, crackles, or rhonchi.  Gastrointestinal: Soft,  non tender, and non distended with positive bowel sounds. No rebound or guarding. Musculoskeletal: full painless range of motion of the left knee with abrasion and overlying warmth, erythema, and pitting edema. Nontender with normal range of motion in all other extremities.  Neurologic: Normal speech and language. Face is symmetric. Moving all extremities. No  gross focal neurologic deficits are appreciated. Skin: Skin is warm, dry and intact. No rash noted. Psychiatric: Mood and affect are normal. Speech and behavior are normal.  ____________________________________________   LABS (all labs ordered are listed, but only abnormal results are displayed)  Labs Reviewed  LACTIC ACID, PLASMA   ____________________________________________  EKG  none  ____________________________________________  RADIOLOGY  none  ____________________________________________   PROCEDURES  Procedure(s) performed: None Procedures Critical Care performed:  Yes  CRITICAL CARE Performed by: Rudene Re  ?  Total critical care time: 35 min  Critical care time was exclusive of separately billable procedures and treating other patients.  Critical care was necessary to treat or prevent imminent or life-threatening deterioration.  Critical care was time spent personally by me on the following activities: development of treatment plan with patient and/or surrogate as well as nursing, discussions with consultants, evaluation of patient's response to treatment, examination of patient, obtaining history from patient or surrogate, ordering and performing treatments and interventions, ordering and review of laboratory studies, ordering and review of radiographic studies, pulse oximetry and re-evaluation of patient's condition.  ____________________________________________   INITIAL IMPRESSION / ASSESSMENT AND PLAN / ED COURSE  66 y.o. male with a history of Parkinson's disease, A. Fib on Coumadin, hypertension hyperlipidemia who presents for evaluation of left lower extremity cellulitis s/p mechanical fall and abrasion to the L knee 1 week ago. Patient has no fever but is tachycardic to 120s, normotensive. Extensive cellulitis overlying the left knee. Patient has full range of motion of his knee with no concern for septic joint. His INR is supratherapeutic  at 4.31. WBC 9.3. AKI with creatinine of 1.32. Will hold coumadin, give IV vancomycin, IV ancef, IVF and admit to Hospitalist.     Pertinent labs & imaging results that were available during my care of the patient were reviewed by me and considered in my medical decision making (see chart for details).    ____________________________________________   FINAL CLINICAL IMPRESSION(S) / ED DIAGNOSES  Final diagnoses:  Cellulitis of left lower extremity  Supratherapeutic INR  Tachycardia  AKI (acute kidney injury) ([place])      NEW MEDICATIONS STARTED DURING THIS VISIT:  New Prescriptions   No medications on file     Note:  This document was prepared using Dragon voice recognition software and may include unintentional dictation errors.    Alfred Levins, [place], MD 06/01/17 [place], [place], MD 06/01/17 [phone]

## 2017-06-01 NOTE — ED Notes (Signed)
See downtime charting. 

## 2017-06-01 NOTE — Progress Notes (Addendum)
Patient was transferred from the ER following cellulitis on the left knee. On admission patient was A&O X4, denied been in pain.  . Patient has history of parkinson disease with shakes, blank face , slow gait and resting tremor. Patient's has cellulitis on the left knee with a healing scab . Patient was able to answer admission questions, VSS on room air.

## 2017-06-01 NOTE — ED Notes (Signed)
Pt states about a week ago he fell on cement and scraped L knee. L knee is red and swollen, mostly underneath L knee. Pt able to bend knee, states painful while walking on it. States hx of parkinson's, tremoring at this time. Pt denies fever.

## 2017-06-01 NOTE — H&P (Signed)
[place] [place] NAME: Pilar Corrales    MR#:  [id number]  DATE OF BIRTH:  February 16, 1951  DATE OF ADMISSION:  06/01/2017  PRIMARY CARE PHYSICIAN: [name], MD   REQUESTING/REFERRING PHYSICIAN: Dr Gonzella Lex  CHIEF COMPLAINT:   Chief Complaint  Patient presents with  . Cellulitis    HISTORY OF PRESENT ILLNESS:  Douglas Silva  is a 66 y.o. male presented to the ER after he had a fall 1 week ago. He saw the urgent care orthopedic center and was sent into the ER for evaluation for cellulitis for IV antibiotics. The patient states that he had a fall about a week ago. Pain in the left knee is 6 out of 10 in intensity. He is able to bend it. He is on Coumadin and his INR is high. Hospitalist services contacted for further evaluation.  PAST MEDICAL HISTORY:   Past Medical History:  Diagnosis Date  . Atrial fibrillation ([place])   . Cardiomyopathy ([place])   . Coronary artery disease   . Hypertension   . Hypothyroidism   . Parkinson's disease ([place])     PAST SURGICAL HISTORY:   Past Surgical History:  Procedure Laterality Date  . COLONOSCOPY N/A 04/10/2015   Procedure: COLONOSCOPY;  Surgeon: Manya Silvas, MD;  Location: [facility] ENDOSCOPY;  Service: Endoscopy;  Laterality: N/A;  . CORONARY ANGIOPLASTY      SOCIAL HISTORY:   Social History  Substance Use Topics  . Smoking status: Never Smoker  . Smokeless tobacco: Never Used  . Alcohol use No    FAMILY HISTORY:   Family History  Problem Relation Age of Onset  . Lung cancer Mother   . Lung cancer Father     DRUG ALLERGIES:  No Known Allergies  REVIEW OF SYSTEMS:  CONSTITUTIONAL: No fever, Positive for chills and fatigue. EYES: No blurred or double vision.  EARS, NOSE, AND THROAT: No tinnitus or ear pain. No sore throat RESPIRATORY: No cough, shortness of breath, wheezing or hemoptysis.  CARDIOVASCULAR: No chest pain, orthopnea, edema.   GASTROINTESTINAL: No nausea, vomiting, diarrhea or abdominal pain. No blood in bowel movements GENITOURINARY: No dysuria, hematuria.  ENDOCRINE: No polyuria, nocturia, positive for hypothyroidism HEMATOLOGY: History of anemia SKIN: Positive for redness left knee MUSCULOSKELETAL: Positive for left knee pain   NEUROLOGIC: No tingling, numbness, weakness.  PSYCHIATRY: No anxiety or depression.   MEDICATIONS AT HOME:   Prior to Admission medications   Medication Sig Start Date End Date Taking? Authorizing Provider  levothyroxine (SYNTHROID, LEVOTHROID) 100 MCG tablet Take 100 mcg by mouth daily before breakfast.   Yes [provider]  metoprolol succinate (TOPROL-XL) 25 MG 24 hr tablet Take 25 mg by mouth daily. 05/20/17  Yes [provider]  simvastatin (ZOCOR) 20 MG tablet Take 20 mg by mouth daily.   Yes [provider]  warfarin (COUMADIN) 5 MG tablet Take 5 mg by mouth daily. @@1700    Yes [provider]      VITAL SIGNS:  Blood pressure 134/87, pulse (!) 106, temperature 97.7 F (36.5 C), temperature source Oral, resp. rate (!) 21, SpO2 100 %.  PHYSICAL EXAMINATION:  GENERAL:  66 y.o.-year-old patient lying in the bed with no acute distress.  EYES: Pupils equal, round, reactive to light and accommodation. No scleral icterus. Extraocular muscles intact.  HEENT: Head atraumatic, normocephalic. Oropharynx and nasopharynx clear.  NECK:  Supple, no jugular venous distention. No thyroid enlargement, no tenderness.  LUNGS:  Normal breath sounds bilaterally, no wheezing, rales,rhonchi or crepitation. No use of accessory muscles of respiration.  CARDIOVASCULAR: S1, S2 Regular regular tachycardic. No murmurs, rubs, or gallops.  ABDOMEN: Soft, nontender, nondistended. Bowel sounds present. No organomegaly or mass.  EXTREMITIES: No pedal edema, cyanosis, or clubbing. Left knee prepatellar swelling and erythema surrounding a scab. NEUROLOGIC: Cranial nerves  II through XII are intact. Muscle strength 5/5 in all extremities. Sensation intact. Gait not checked.  PSYCHIATRIC: The patient is alert and oriented x 3.  SKIN: No rash, lesion, or ulcer.   LABORATORY PANEL:  Pertinent laboratory data. INR 4, potassium 3.3, creatinine 1.32, white blood cell count 9.3. Hemoglobin 12.1   IMPRESSION AND PLAN:   1. Prepatellar cellulitis. IV antibiotics with IV Ancef. Hold Coumadin. If no improvement may need orthopedic evaluation. 2. Atrial fibrillation rapid. Given a stat dose of metoprolol. Already anticoagulated with Coumadin. INR is high. Hold Coumadin. 3. Parkinson's disease. Does not seem to be on any medications for this 4. Hypothyroidism unspecified on Synthroid  All the records are reviewed and case discussed with ED provider. Management plans discussed with the patient, family and they are in agreement.  CODE STATUS: Full code  TOTAL TIME TAKING CARE OF THIS PATIENT: 50 minutes.    Loletha Grayer M.D on 06/01/2017 at 8:18 PM  Between 7am to 6pm - Pager - [phone]  After 6pm call admission pager [phone]  [facility] Office  [phone]  CC: Primary care physician; [name], MD

## 2017-06-02 DIAGNOSIS — L03116 Cellulitis of left lower limb: Secondary | ICD-10-CM | POA: Diagnosis not present

## 2017-06-02 LAB — CBC
HEMATOCRIT: 34.8 % — AB (ref 40.0–52.0)
Hemoglobin: 11.8 g/dL — ABNORMAL LOW (ref 13.0–18.0)
MCH: 31.1 pg (ref 26.0–34.0)
MCHC: 34 g/dL (ref 32.0–36.0)
MCV: 91.4 fL (ref 80.0–100.0)
Platelets: 231 10*3/uL (ref 150–440)
RBC: 3.81 MIL/uL — ABNORMAL LOW (ref 4.40–5.90)
RDW: 13.5 % (ref 11.5–14.5)
WBC: 10.7 10*3/uL — ABNORMAL HIGH (ref 3.8–10.6)

## 2017-06-02 LAB — BASIC METABOLIC PANEL
Anion gap: 6 (ref 5–15)
BUN: 23 mg/dL — AB (ref 6–20)
CALCIUM: 8.7 mg/dL — AB (ref 8.9–10.3)
CO2: 29 mmol/L (ref 22–32)
Chloride: 103 mmol/L (ref 101–111)
Creatinine, Ser: 0.97 mg/dL (ref 0.61–1.24)
GFR calc Af Amer: >60 mL/min (ref >60)
GLUCOSE: 103 mg/dL — AB (ref 65–99)
POTASSIUM: 3.3 mmol/L — AB (ref 3.5–5.1)
Sodium: 138 mmol/L (ref 135–145)

## 2017-06-02 NOTE — Evaluation (Signed)
Physical Therapy Evaluation Patient Details Name: Douglas Silva. MRN: [id number] DOB: 31-Dec-1950 Today's Date: 06/02/2017   History of Present Illness  66 y/o male who fell about a week ago on L knee, now with signficant swelling/cellulitis, severe pain. Pt has Parkinson's, reports >10 falls in the last few months.  Clinical Impression  Pt showed good effort and willingness to participate but showed very poor, Parkinsonian gait compounded by pain in L knee.  He did have 5-100 degrees of motion in the L knee but had significant pain in the knee, especially with WBing.  Pt has had many falls in the last few months and admits that it is getting harder at home for him to manage.  Discussed change in home status, reports he has been talking with his sister about options.  Regardless pt is not safe to return home at this time, will need rehab.     Follow Up Recommendations SNF    Equipment Recommendations       Recommendations for Other Services       Precautions / Restrictions Precautions Precautions: Fall Restrictions Weight Bearing Restrictions: No      Mobility  Bed Mobility Overal bed mobility: Modified Independent                Transfers Overall transfer level: Needs assistance   Transfers: Sit to/from Stand Sit to Stand: Min assist         General transfer comment: Pt attempted to stand from bed, was unable to initiate lifting himself w/o direct assist.  Pt was able to maintain balance w/o walker once up, but had definite need for assist to get to upright.  Ambulation/Gait Ambulation/Gait assistance: Min assist Ambulation Distance (Feet): 35 Feet Assistive device: Rolling walker (2 wheeled)       General Gait Details: Pt with very inconsistent, shuffling Parkinsonian gait.  He appeared somewhat hesitant to bear much weight through L LE, but states that WBing was much less painful with using AD than when he was doing so w/o.  Stairs             Wheelchair Mobility    Modified Rankin (Stroke Patients Only)       Balance Overall balance assessment: Needs assistance   Sitting balance-Leahy Scale: Good       Standing balance-Leahy Scale: Poor Standing balance comment: Pt heavily reliant on walker, tremors, unsteadiness                             Pertinent Vitals/Pain Pain Assessment: 0-10 Pain Score: 7  Pain Location: L knee and lower leg    Home Living Family/patient expects to be discharged to:: Private residence Living Arrangements: Alone Available Help at Discharge: Available PRN/intermittently;Neighbor;Family   Home Access: Stairs to enter   [employer] of Steps: 2 (no rails) or 6 with rail   Home Equipment: Walker - 2 wheels      Prior Function Level of Independence: Independent         Comments: Pt reports that he does not use his walker - admits that he should     Hand Dominance        Extremity/Trunk Assessment   Upper Extremity Assessment Upper Extremity Assessment: Overall WFL for tasks assessed;Generalized weakness    Lower Extremity Assessment Lower Extremity Assessment: Overall WFL for tasks assessed (L knee extension hesitancy 2/2 pain, grossly 3+/5)       Communication  Communication: No difficulties  Cognition Arousal/Alertness: Awake/alert Behavior During Therapy: WFL for tasks assessed/performed Overall Cognitive Status: Within Functional Limits for tasks assessed                                        General Comments      Exercises     Assessment/Plan    PT Assessment Patient needs continued PT services  PT Problem List Decreased strength;Decreased range of motion;Decreased activity tolerance;Decreased balance;Decreased mobility;Decreased coordination;Decreased knowledge of use of DME;Decreased safety awareness;Pain       PT Treatment Interventions DME instruction;Gait training;Stair training;Functional mobility  training;Therapeutic activities;Therapeutic exercise;Balance training;Neuromuscular re-education;Patient/family education    PT Goals (Current goals can be found in the Care Plan section)  Acute Rehab PT Goals Patient Stated Goal: get swelling out of L LE PT Goal Formulation: With patient Time For Goal Achievement: 06/16/17 Potential to Achieve Goals: Fair    Frequency Min 2X/week   Barriers to discharge Decreased caregiver support discussed considering change in home situation (closer to family, etc)    Co-evaluation               AM-PAC PT "6 Clicks" Daily Activity  Outcome Measure Difficulty turning over in bed (including adjusting bedclothes, sheets and blankets)?: None Difficulty moving from lying on back to sitting on the side of the bed? : None Difficulty sitting down on and standing up from a chair with arms (e.g., wheelchair, bedside commode, etc,.)?: Total Help needed moving to and from a bed to chair (including a wheelchair)?: A Little Help needed walking in hospital room?: A Lot Help needed climbing 3-5 steps with a railing? : A Lot 6 Click Score: 16    End of Session Equipment Utilized During Treatment: Gait belt Activity Tolerance: Patient limited by fatigue;Patient limited by pain Patient left: with chair alarm set;with call bell/phone within reach   PT Visit Diagnosis: Unsteadiness on feet (R26.81);Repeated falls (R29.6);Muscle weakness (generalized) (M62.81);Difficulty in walking, not elsewhere classified (R26.2)    Time: 1137-1203 PT Time Calculation (min) (ACUTE ONLY): 26 min   Charges:   PT Evaluation $PT Eval Low Complexity: 1 Low     PT G Codes:   PT G-Codes **NOT FOR INPATIENT CLASS** Functional Assessment Tool Used: AM-PAC 6 Clicks Basic Mobility Functional Limitation: Mobility: Walking and moving around Mobility: Walking and Moving Around Current Status (Z6109): At least 40 percent but less than 60 percent impaired, limited or  restricted Mobility: Walking and Moving Around Goal Status [phone]): At least 1 percent but less than 20 percent impaired, limited or restricted    Kreg Shropshire, DPT 06/02/2017, 1:24 PM

## 2017-06-02 NOTE — Consult Note (Signed)
ORTHOPAEDIC CONSULTATION  REQUESTING PHYSICIAN: Max Sane, MD  Chief Complaint: Left knee swelling and erythema  HPI: Douglas Silva. is a 66 y.o. male on Coumadin for atrial fibrillation who fell approximately 1 week ago descending stairs. Patient had significant swelling and sustained an abrasion. Patient states he did not have significant bleeding at the time of injury however. Patient denies any pain this evening and is not having any trouble with flexion or extension of the knee.  Past Medical History:  Diagnosis Date  . Atrial fibrillation ([place])   . Cardiomyopathy ([place])   . Coronary artery disease   . Hypertension   . Hypothyroidism   . Parkinson's disease [facility])    Past Surgical History:  Procedure Laterality Date  . COLONOSCOPY N/A 04/10/2015   Procedure: COLONOSCOPY;  Surgeon: Manya Silvas, MD;  Location: [facility] ENDOSCOPY;  Service: Endoscopy;  Laterality: N/A;  . CORONARY ANGIOPLASTY     Social History   Social History  . Marital status: Divorced    Spouse name: N/A  . Number of children: N/A  . Years of education: N/A   Social History Main Topics  . Smoking status: Never Smoker  . Smokeless tobacco: Never Used  . Alcohol use No  . Drug use: No  . Sexual activity: Not Asked   Other Topics Concern  . None   Social History Narrative  . None   Family History  Problem Relation Age of Onset  . Lung cancer Mother   . Lung cancer Father    No Known Allergies Prior to Admission medications   Medication Sig Start Date End Date Taking? Authorizing Provider  levothyroxine (SYNTHROID, LEVOTHROID) 100 MCG tablet Take 100 mcg by mouth daily before breakfast.   Yes [provider]  metoprolol succinate (TOPROL-XL) 25 MG 24 hr tablet Take 25 mg by mouth daily. 05/20/17  Yes [provider]  simvastatin (ZOCOR) 20 MG tablet Take 20 mg by mouth daily.   Yes [provider]  warfarin (COUMADIN) 5 MG tablet Take 5 mg by mouth daily.  @@1700    Yes [provider]   No results found.  Positive ROS: All other systems have been reviewed and were otherwise negative with the exception of those mentioned in the HPI and as above.  Physical Exam: General: Alert, no acute distress  MUSCULOSKELETAL: Left lower extremity: Patient has a healed small abrasion over the tibial tubercle. He has swelling, resolving ecchymosis and erythema in the anterior proximal leg. There is no knee effusion. He can actively fully extend and flex his knee to 120 without pain. He had mild tenderness over the anterior proximal tibia to palpation. No fluid could be expressed. There is no active drainage. He had no calf tenderness. His leg compartments are soft and compressible. He has palpable pedal pulses, intact sensation light touch and intact motor function distally.  Assessment: Left proximal leg hematoma s/p fall with possible cellulitis  Plan: Patient had an INR of 4.31 yesterday upon admission. His Coumadin is currently being held. His white count is 10.7. He is receiving IV Ancef. I agree with the plan for IV antibiotics. The patient feels that his knee swelling and erythema are improving. He shows no signs of a septic knee as he is able to actively flex and extend his knee without pain. There is no definite area of fluctuance palpated on exam. Continue IV antibiotics. I will reassess the patient tomorrow. I agree with a physical therapy evaluation as well.  [place], MD    06/02/2017 7:42 PM

## 2017-06-02 NOTE — Progress Notes (Signed)
[facility] at [place] NAME: Douglas Silva    MR#:  [id number]  DATE OF BIRTH:  1951-08-14  SUBJECTIVE:  CHIEF COMPLAINT:   Chief Complaint  Patient presents with  . Cellulitis  Having a lot of difficulty bending his leg beyond 90, the redness seemed to be improving, although has lot of swelling around the knee REVIEW OF SYSTEMS:  Review of Systems  Constitutional: Negative for chills, fever and weight loss.  HENT: Negative for nosebleeds and sore throat.   Eyes: Negative for blurred vision.  Respiratory: Negative for cough, shortness of breath and wheezing.   Cardiovascular: Negative for chest pain, orthopnea, leg swelling and PND.  Gastrointestinal: Negative for abdominal pain, constipation, diarrhea, heartburn, nausea and vomiting.  Genitourinary: Negative for dysuria and urgency.  Musculoskeletal: Positive for joint pain. Negative for back pain.  Skin: Negative for rash.  Neurological: Negative for dizziness, speech change, focal weakness and headaches.  Endo/Heme/Allergies: Does not bruise/bleed easily.  Psychiatric/Behavioral: Negative for depression.    DRUG ALLERGIES:  No Known Allergies VITALS:  Blood pressure 115/72, pulse 88, temperature 97.6 F (36.4 C), temperature source Oral, resp. rate 19, height 6' (1.829 m), weight 71.3 kg (157 lb 3.2 oz), SpO2 98 %. PHYSICAL EXAMINATION:  Physical Exam  Constitutional: He is oriented to person, place, and time and well-developed, well-nourished, and in no distress.  HENT:  Head: Normocephalic and atraumatic.  Eyes: Pupils are equal, round, and reactive to light. Conjunctivae and EOM are normal.  Neck: Normal range of motion. Neck supple. No tracheal deviation present. No thyromegaly present.  Cardiovascular: Normal rate, regular rhythm and normal heart sounds.   Pulmonary/Chest: Effort normal and breath sounds normal. No respiratory distress. He has no wheezes. He exhibits no  tenderness.  Abdominal: Soft. Bowel sounds are normal. He exhibits no distension. There is no tenderness.  Musculoskeletal:       Left knee: He exhibits decreased range of motion and swelling. Tenderness found. Patellar tendon tenderness noted.  Left knee prepatellar swelling and erythema surrounding a scab.  Neurological: He is alert and oriented to person, place, and time. No cranial nerve deficit.  Skin: Skin is warm and dry. No rash noted.  Psychiatric: Mood and affect normal.   LABORATORY PANEL:  Male CBC  Recent Labs Lab 06/02/17 0347  WBC 10.7*  HGB 11.8*  HCT 34.8*  PLT 231   ------------------------------------------------------------------------------------------------------------------ Chemistries   Recent Labs Lab 06/01/17 1514 06/02/17 0347  NA 138 138  K 3.3* 3.3*  CL 102 103  CO2 28 29  GLUCOSE 94 103*  BUN 31* 23*  CREATININE 1.32* 0.97  CALCIUM 9.0 8.7*  AST 19  --   ALT 13*  --   ALKPHOS 62  --   BILITOT 1.6*  --    RADIOLOGY:  No results found. ASSESSMENT AND PLAN:   1. Prepatellar cellulitis/hemarthosis.  IV antibiotics with IV Ancef. Hold Coumadin. Consult orthopedic , as he seemed to have a lot of difficulty bending the knee, which may be just due to swelling but cannot rule out underlying hemarthrosis and/ infection  2. Atrial fibrillation rapid. Continue metoprolol, holding coumadin as INR 4.3  3. Parkinson's disease. Does not seem to be on any medications for this, outpt neuro eval  4. Hypothyroidism unspecified on Synthroid  5.  Hypokalemia: Replete and recheck   All the records are reviewed and case discussed with Care Management/Social Worker. Management plans discussed with the patient, nursing and  they are in agreement.  CODE STATUS: Full Code  TOTAL TIME TAKING CARE OF THIS PATIENT: 35 minutes.   More than 50% of the time was spent in counseling/coordination of care: YES  POSSIBLE D/C IN 1-2 DAYS, DEPENDING ON CLINICAL  CONDITION.   Max Sane M.D on 06/02/2017 at 2:42 PM  Between 7am to 6pm - Pager - [phone]  After 6pm go to [email or web address] - Proofreader  [facility]  Office  [phone]  CC: Primary care physician; [name], MD  Note: This dictation was prepared with Dragon dictation along with smaller phrase technology. Any transcriptional errors that result from this process are unintentional.

## 2017-06-02 NOTE — NC FL2 (Signed)
  [place] LEVEL OF CARE SCREENING TOOL     IDENTIFICATION  Patient Name: Douglas Silva. Birthdate: 24-Aug-1951 Sex: male Admission Date (Current Location): 06/01/2017  [place] and [place] Number:  Engineering geologist and Address:  [facility], [address] [place], [place] [address]      Provider Number: [id number]  Attending Physician Name and Address:  Max Sane, MD  Relative Name and Phone Number:       Current Level of Care: Hospital Recommended Level of Care: [place] Prior Approval Number:    Date Approved/Denied:   PASRR Number: [id number]  Discharge Plan: SNF    Current Diagnoses: Patient Active Problem List   Diagnosis Date Noted  . Cellulitis 06/01/2017    Orientation RESPIRATION BLADDER Height & Weight     Self, Time, Situation, Place  Normal Continent Weight: 157 lb 3.2 oz (71.3 kg) Height:  6' (182.9 cm)  BEHAVIORAL SYMPTOMS/MOOD NEUROLOGICAL BOWEL NUTRITION STATUS      Continent Diet (Heart Healthy, Thin Liquids)  AMBULATORY STATUS COMMUNICATION OF NEEDS Skin   Limited Assist Verbally Normal                       Personal Care Assistance Level of Assistance  Bathing, Feeding, Dressing Bathing Assistance: Limited assistance Feeding assistance: Independent Dressing Assistance: Limited assistance     Functional Limitations Info  Sight, Hearing, Speech Sight Info: Adequate Hearing Info: Adequate Speech Info: Adequate    SPECIAL CARE FACTORS FREQUENCY  PT (By licensed PT)                    Contractures Contractures Info: Not present    Additional Factors Info  Code Status, Allergies Code Status Info: Full Code Allergies Info: No known allergies           Current Medications (06/02/2017):  This is the current hospital active medication list Current Facility-Administered Medications  Medication Dose Route Frequency Provider Last Rate Last Dose  .  acetaminophen (TYLENOL) tablet 650 mg  650 mg Oral Q6H PRN Loletha Grayer, MD   650 mg at 06/02/17 0154   Or  . acetaminophen (TYLENOL) suppository 650 mg  650 mg Rectal Q6H PRN Wieting, Richard, MD      . ceFAZolin (ANCEF) IVPB 1 g/50 mL premix  1 g Intravenous Q8H Loletha Grayer, MD   Stopped at 06/02/17 1440  . levothyroxine (SYNTHROID, LEVOTHROID) tablet 100 mcg  100 mcg Oral QAC breakfast Loletha Grayer, MD   100 mcg at 06/02/17 0747  . metoprolol succinate (TOPROL-XL) 24 hr tablet 25 mg  25 mg Oral Daily Loletha Grayer, MD   25 mg at 06/02/17 1013  . simvastatin (ZOCOR) tablet 20 mg  20 mg Oral Daily Loletha Grayer, MD   20 mg at 06/02/17 1013     Discharge Medications: Please see discharge summary for a list of discharge medications.  Relevant Imaging Results:  Relevant Lab Results:   Additional Information SSN:  [id number]  Darden Dates, LCSW

## 2017-06-03 DIAGNOSIS — L03116 Cellulitis of left lower limb: Secondary | ICD-10-CM | POA: Diagnosis not present

## 2017-06-03 LAB — HIV ANTIBODY (ROUTINE TESTING W REFLEX): HIV Screen 4th Generation wRfx: NONREACTIVE

## 2017-06-03 LAB — PROTIME-INR
INR: 2.85
PROTHROMBIN TIME: 30.5 s — AB (ref 11.4–15.2)

## 2017-06-03 MED ORDER — CEPHALEXIN 250 MG PO CAPS: 250.0000 mg | ORAL_CAPSULE | Freq: Two times a day (BID) | ORAL | 0 refills | Status: AC

## 2017-06-03 MED ORDER — IBUPROFEN 400 MG PO TABS: 400.0000 mg | ORAL_TABLET | Freq: Four times a day (QID) | ORAL | 0 refills | Status: DC | PRN

## 2017-06-03 NOTE — Discharge Summary (Signed)
[facility] at [place] NAME: Douglas Silva    MR#:  [id number]  DATE OF BIRTH:  November 25, 1950  DATE OF ADMISSION:  06/01/2017   ADMITTING PHYSICIAN: Loletha Grayer, MD  DATE OF DISCHARGE: 06/03/2017 11:50 AM  PRIMARY CARE PHYSICIAN: [name], MD   ADMISSION DIAGNOSIS:  Tachycardia [R00.0] Cellulitis of left lower extremity [L03.116] AKI (acute kidney injury) ([place]) [N17.9] Supratherapeutic INR [R79.1] DISCHARGE DIAGNOSIS:  Active Problems:   Cellulitis  SECONDARY DIAGNOSIS:   Past Medical History:  Diagnosis Date  . Atrial fibrillation ([place])   . Cardiomyopathy ([place])   . Coronary artery disease   . Hypertension   . Hypothyroidism   . Parkinson's disease ([place])    HOSPITAL COURSE:   1. Left proximal leg hematoma s/p fall with possible cellulitis - much improved with IV antibiotics Ancef. Held Coumadin as INR was high but is down to 2.85 today. He is instructed to hold coumadin today and resume tomorrow.  2. Atrial fibrillation rapid. Continue metoprolol, resume coumadin tomorrow  3. Parkinson's disease: outpt neuro eval  4. Hypothyroidism unspecified on Synthroid  5.  Hypokalemia: Repleted  DISCHARGE CONDITIONS:  stable CONSULTS OBTAINED:   DRUG ALLERGIES:  No Known Allergies DISCHARGE MEDICATIONS:   Allergies as of 06/03/2017   No Known Allergies     Medication List    TAKE these medications   cephALEXin 250 MG capsule Commonly known as:  KEFLEX Take 1 capsule (250 mg total) by mouth 2 (two) times daily.   ibuprofen 400 MG tablet Commonly known as:  ADVIL,MOTRIN Take 1 tablet (400 mg total) by mouth every 6 (six) hours as needed.   levothyroxine 100 MCG tablet Commonly known as:  SYNTHROID, LEVOTHROID Take 100 mcg by mouth daily before breakfast.   metoprolol succinate 25 MG 24 hr tablet Commonly known as:  TOPROL-XL Take 25 mg by mouth daily.   simvastatin 20 MG tablet Commonly known as:   ZOCOR Take 20 mg by mouth daily.   warfarin 5 MG tablet Commonly known as:  COUMADIN Take 5 mg by mouth daily. @@1700         DISCHARGE INSTRUCTIONS:   DIET:  Regular diet DISCHARGE CONDITION:  Good ACTIVITY:  Activity as tolerated OXYGEN:  Home Oxygen: No.  Oxygen Delivery: room air DISCHARGE LOCATION:  home   If you experience worsening of your admission symptoms, develop shortness of breath, life threatening emergency, suicidal or homicidal thoughts you must seek medical attention immediately by calling 911 or calling your MD immediately  if symptoms less severe.  You Must read complete instructions/literature along with all the possible adverse reactions/side effects for all the Medicines you take and that have been prescribed to you. Take any new Medicines after you have completely understood and accpet all the possible adverse reactions/side effects.   Please note  You were cared for by a hospitalist during your hospital stay. If you have any questions about your discharge medications or the care you received while you were in the hospital after you are discharged, you can call the unit and asked to speak with the hospitalist on call if the hospitalist that took care of you is not available. Once you are discharged, your primary care physician will handle any further medical issues. Please note that NO REFILLS for any discharge medications will be authorized once you are discharged, as it is imperative that you return to your primary care physician (or establish a relationship with a primary  care physician if you do not have one) for your aftercare needs so that they can reassess your need for medications and monitor your lab values.    On the day of Discharge:  VITAL SIGNS:  Blood pressure 108/67, pulse (!) 110, temperature 98.4 F (36.9 C), temperature source Oral, resp. rate 18, height 6' (1.829 m), weight 71.3 kg (157 lb 3.2 oz), SpO2 100 %. PHYSICAL EXAMINATION:    GENERAL:  66 y.o.-year-old patient lying in the bed with no acute distress.  EYES: Pupils equal, round, reactive to light and accommodation. No scleral icterus. Extraocular muscles intact.  HEENT: Head atraumatic, normocephalic. Oropharynx and nasopharynx clear.  NECK:  Supple, no jugular venous distention. No thyroid enlargement, no tenderness.  LUNGS: Normal breath sounds bilaterally, no wheezing, rales,rhonchi or crepitation. No use of accessory muscles of respiration.  CARDIOVASCULAR: S1, S2 normal. No murmurs, rubs, or gallops.  ABDOMEN: Soft, non-tender, non-distended. Bowel sounds present. No organomegaly or mass.  EXTREMITIES: No pedal edema, cyanosis, or clubbing.  Left lower extremity: Patient has a healed small abrasion over the tibial tubercle. He has swelling, resolving ecchymosis and erythema in the anterior proximal leg. There is no knee effusion. He can actively fully extend and flex his knee to 120 without pain. He had mild tenderness over the anterior proximal tibia to palpation. No fluid could be expressed. There is no active drainage. He had no calf tenderness. His leg compartments are soft and compressible. He has palpable pedal pulses, intact sensation light touch and intact motor function distally. NEUROLOGIC: Cranial nerves II through XII are intact. Muscle strength 5/5 in all extremities. Sensation intact. Gait not checked.  PSYCHIATRIC: The patient is alert and oriented x 3.  SKIN: No obvious rash, lesion, or ulcer.  DATA REVIEW:   CBC  Recent Labs Lab 06/02/17 0347  WBC 10.7*  HGB 11.8*  HCT 34.8*  PLT 231    Chemistries   Recent Labs Lab 06/01/17 1514 06/02/17 0347  NA 138 138  K 3.3* 3.3*  CL 102 103  CO2 28 29  GLUCOSE 94 103*  BUN 31* 23*  CREATININE 1.32* 0.97  CALCIUM 9.0 8.7*  AST 19  --   ALT 13*  --   ALKPHOS 62  --   BILITOT 1.6*  --     Follow-up Information    [name], MD. Go on 06/16/2017.   Specialty:  Internal  Medicine Why:  @2 :00 PM Contact information: [address] [place] [address] [phone]        Thornton Park, MD. Go on 06/10/2017.   Specialty:  Orthopedic Surgery Why:  @9 :00 AM Contact information: [place]  [phone]           Management plans discussed with the patient, family and they are in agreement.  CODE STATUS: Prior   TOTAL TIME TAKING CARE OF THIS PATIENT: 45 minutes.    Max Sane M.D on 06/03/2017 at 8:11 PM  Between 7am to 6pm - Pager - [phone]  After 6pm go to [email or web address] - Proofreader  [facility]  Office  [phone]  CC: Primary care physician; [name], MD   Note: This dictation was prepared with Dragon dictation along with smaller phrase technology. Any transcriptional errors that result from this process are unintentional.

## 2017-06-03 NOTE — Care Management Obs Status (Signed)
[place] NOTIFICATION   Patient Details  Name: Douglas Silva. MRN: [id number] Date of Birth: May 21, 1951   Medicare Observation Status Notification Given:  Yes    Shelbie Ammons, RN 06/03/2017, 8:31 AM

## 2017-06-03 NOTE — Care Management (Signed)
Discharge to home today per Dr. Manuella Ghazi. Declining skilled nursing at this time. [place] live with his sister. Will pay for taxi home [place] [facility] [place] Management [phone]

## 2017-06-03 NOTE — Progress Notes (Signed)
Discharge instructions along with home medications and follow up gone over with patient. He verbalized that he understood instructions. 2 prescriptions given to patient. IV removed. Pt being discharged home on room air, no distress noted. Ammie Dalton, RN

## 2017-06-03 NOTE — Discharge Instructions (Signed)
Hematoma A hematoma is a collection of blood. The collection of blood can turn into a hard, painful lump under the skin. Your skin may turn blue or yellow if the hematoma is close to the surface of the skin. Most hematomas get better in a few days to weeks. Some hematomas are serious and need medical care. Hematomas can be very small or very big. Follow these instructions at home:  Apply ice to the injured area: ? Put ice in a plastic bag. ? Place a towel between your skin and the bag. ? Leave the ice on for 20 minutes, 2-3 times a day for the first 1 to 2 days.  After the first 2 days, switch to using warm packs on the injured area.  Raise (elevate) the injured area to lessen pain and puffiness (swelling). You may also wrap the area with an elastic bandage. Make sure the bandage is not wrapped too tight.  If you have a painful hematoma on your leg or foot, you may use crutches for a couple days.  Only take medicines as told by your doctor. Get help right away if:  Your pain gets worse.  Your pain is not controlled with medicine.  You have a fever.  Your puffiness gets worse.  Your skin turns more blue or yellow.  Your skin over the hematoma breaks or starts bleeding.  Your hematoma is in your chest or belly (abdomen) and you are short of breath, feel weak, or have a change in consciousness.  Your hematoma is on your scalp and you have a headache that gets worse or a change in alertness or consciousness. This information is not intended to replace advice given to you by your health care provider. Make sure you discuss any questions you have with your health care provider. Document Released: 11/18/2004 Document Revised: 03/18/2016 Document Reviewed: 03/21/2013 Elsevier Interactive Patient Education  2017 [employer] Inc[employer]  

## 2017-07-11 ENCOUNTER — Emergency Department
Admission: EM | Admit: 2017-07-11 | Discharge: 2017-07-11 | Disposition: A | Discharge status: Home / Self Care | Payer: No Typology Code available for payment source | Attending: Emergency Medicine | Admitting: Emergency Medicine

## 2017-07-11 ENCOUNTER — Encounter: Payer: Self-pay | Admitting: *Deleted

## 2017-07-11 ENCOUNTER — Emergency Department: Payer: No Typology Code available for payment source

## 2017-07-11 DIAGNOSIS — Z7901 Long term (current) use of anticoagulants: Secondary | ICD-10-CM | POA: Insufficient documentation

## 2017-07-11 DIAGNOSIS — E039 Hypothyroidism, unspecified: Secondary | ICD-10-CM | POA: Insufficient documentation

## 2017-07-11 DIAGNOSIS — I2510 Atherosclerotic heart disease of native coronary artery without angina pectoris: Secondary | ICD-10-CM | POA: Diagnosis not present

## 2017-07-11 DIAGNOSIS — Y999 Unspecified external cause status: Secondary | ICD-10-CM | POA: Insufficient documentation

## 2017-07-11 DIAGNOSIS — G20 Parkinson's disease: Secondary | ICD-10-CM | POA: Diagnosis not present

## 2017-07-11 DIAGNOSIS — Y92410 Unspecified street and highway as the place of occurrence of the external cause: Secondary | ICD-10-CM | POA: Insufficient documentation

## 2017-07-11 DIAGNOSIS — Z79899 Other long term (current) drug therapy: Secondary | ICD-10-CM | POA: Diagnosis not present

## 2017-07-11 DIAGNOSIS — S161XXA Strain of muscle, fascia and tendon at neck level, initial encounter: Secondary | ICD-10-CM | POA: Insufficient documentation

## 2017-07-11 DIAGNOSIS — I10 Essential (primary) hypertension: Secondary | ICD-10-CM | POA: Diagnosis not present

## 2017-07-11 DIAGNOSIS — S199XXA Unspecified injury of neck, initial encounter: Secondary | ICD-10-CM | POA: Diagnosis present

## 2017-07-11 DIAGNOSIS — Y939 Activity, unspecified: Secondary | ICD-10-CM | POA: Insufficient documentation
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
## 2017-07-11 NOTE — Discharge Instructions (Signed)
You may take Tylenol as needed for symptom relief as directed on the medication bottle. You may also use cold or heat therapy symptoms.
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
## 2017-09-03 ENCOUNTER — Emergency Department: Payer: Medicare Other

## 2017-09-03 ENCOUNTER — Inpatient Hospital Stay
Admission: EM | Admit: 2017-09-03 | Discharge: 2017-09-16 | DRG: 683 | Disposition: A | Discharge status: Skilled Nursing Facility | Payer: Medicare Other | Attending: Internal Medicine | Admitting: Internal Medicine

## 2017-09-03 ENCOUNTER — Other Ambulatory Visit: Payer: Self-pay

## 2017-09-03 DIAGNOSIS — E876 Hypokalemia: Secondary | ICD-10-CM | POA: Diagnosis present

## 2017-09-03 DIAGNOSIS — T796XXA Traumatic ischemia of muscle, initial encounter: Secondary | ICD-10-CM | POA: Diagnosis present

## 2017-09-03 DIAGNOSIS — W010XXA Fall on same level from slipping, tripping and stumbling without subsequent striking against object, initial encounter: Secondary | ICD-10-CM | POA: Diagnosis present

## 2017-09-03 DIAGNOSIS — M549 Dorsalgia, unspecified: Secondary | ICD-10-CM | POA: Diagnosis present

## 2017-09-03 DIAGNOSIS — I959 Hypotension, unspecified: Secondary | ICD-10-CM | POA: Diagnosis present

## 2017-09-03 DIAGNOSIS — E8770 Fluid overload, unspecified: Secondary | ICD-10-CM | POA: Diagnosis not present

## 2017-09-03 DIAGNOSIS — N400 Enlarged prostate without lower urinary tract symptoms: Secondary | ICD-10-CM | POA: Diagnosis present

## 2017-09-03 DIAGNOSIS — Z7901 Long term (current) use of anticoagulants: Secondary | ICD-10-CM | POA: Diagnosis not present

## 2017-09-03 DIAGNOSIS — N189 Chronic kidney disease, unspecified: Secondary | ICD-10-CM | POA: Diagnosis not present

## 2017-09-03 DIAGNOSIS — R319 Hematuria, unspecified: Secondary | ICD-10-CM | POA: Diagnosis present

## 2017-09-03 DIAGNOSIS — N170 Acute kidney failure with tubular necrosis: Principal | ICD-10-CM | POA: Diagnosis present

## 2017-09-03 DIAGNOSIS — Z9861 Coronary angioplasty status: Secondary | ICD-10-CM | POA: Diagnosis not present

## 2017-09-03 DIAGNOSIS — R32 Unspecified urinary incontinence: Secondary | ICD-10-CM | POA: Diagnosis present

## 2017-09-03 DIAGNOSIS — M6282 Rhabdomyolysis: Secondary | ICD-10-CM | POA: Diagnosis present

## 2017-09-03 DIAGNOSIS — E785 Hyperlipidemia, unspecified: Secondary | ICD-10-CM | POA: Diagnosis present

## 2017-09-03 DIAGNOSIS — N289 Disorder of kidney and ureter, unspecified: Secondary | ICD-10-CM

## 2017-09-03 DIAGNOSIS — W19XXXA Unspecified fall, initial encounter: Secondary | ICD-10-CM

## 2017-09-03 DIAGNOSIS — Y92009 Unspecified place in unspecified non-institutional (private) residence as the place of occurrence of the external cause: Secondary | ICD-10-CM

## 2017-09-03 DIAGNOSIS — I10 Essential (primary) hypertension: Secondary | ICD-10-CM | POA: Diagnosis present

## 2017-09-03 DIAGNOSIS — I4891 Unspecified atrial fibrillation: Secondary | ICD-10-CM | POA: Diagnosis not present

## 2017-09-03 DIAGNOSIS — T466X5A Adverse effect of antihyperlipidemic and antiarteriosclerotic drugs, initial encounter: Secondary | ICD-10-CM | POA: Diagnosis present

## 2017-09-03 DIAGNOSIS — N179 Acute kidney failure, unspecified: Secondary | ICD-10-CM | POA: Diagnosis present

## 2017-09-03 DIAGNOSIS — D72829 Elevated white blood cell count, unspecified: Secondary | ICD-10-CM | POA: Diagnosis present

## 2017-09-03 DIAGNOSIS — I2510 Atherosclerotic heart disease of native coronary artery without angina pectoris: Secondary | ICD-10-CM | POA: Diagnosis present

## 2017-09-03 DIAGNOSIS — G20 Parkinson's disease: Secondary | ICD-10-CM | POA: Diagnosis present

## 2017-09-03 DIAGNOSIS — Z801 Family history of malignant neoplasm of trachea, bronchus and lung: Secondary | ICD-10-CM

## 2017-09-03 DIAGNOSIS — I482 Chronic atrial fibrillation: Secondary | ICD-10-CM | POA: Diagnosis present

## 2017-09-03 DIAGNOSIS — E860 Dehydration: Secondary | ICD-10-CM | POA: Diagnosis present

## 2017-09-03 DIAGNOSIS — E039 Hypothyroidism, unspecified: Secondary | ICD-10-CM | POA: Diagnosis present

## 2017-09-03 LAB — BASIC METABOLIC PANEL
ANION GAP: 11 (ref 5–15)
BUN: 38 mg/dL — ABNORMAL HIGH (ref 6–20)
CHLORIDE: 102 mmol/L (ref 101–111)
CO2: 24 mmol/L (ref 22–32)
Calcium: 8.8 mg/dL — ABNORMAL LOW (ref 8.9–10.3)
Creatinine, Ser: 2.07 mg/dL — ABNORMAL HIGH (ref 0.61–1.24)
GFR, EST AFRICAN AMERICAN: 37 mL/min — AB (ref >60)
GFR, EST NON AFRICAN AMERICAN: 32 mL/min — AB (ref >60)
Glucose, Bld: 92 mg/dL (ref 65–99)
POTASSIUM: 3.3 mmol/L — AB (ref 3.5–5.1)
SODIUM: 137 mmol/L (ref 135–145)

## 2017-09-03 LAB — CBC
HEMATOCRIT: 43.8 % (ref 40.0–52.0)
HEMOGLOBIN: 14.5 g/dL (ref 13.0–18.0)
MCH: 30.6 pg (ref 26.0–34.0)
MCHC: 33.2 g/dL (ref 32.0–36.0)
MCV: 92.1 fL (ref 80.0–100.0)
Platelets: 205 10*3/uL (ref 150–440)
RBC: 4.76 MIL/uL (ref 4.40–5.90)
RDW: 13.6 % (ref 11.5–14.5)
WBC: 26 10*3/uL — AB (ref 3.8–10.6)

## 2017-09-03 LAB — URINALYSIS, COMPLETE (UACMP) WITH MICROSCOPIC: SPECIFIC GRAVITY, URINE: 1.017 (ref 1.005–1.030)

## 2017-09-03 MED ORDER — IBUPROFEN 400 MG PO TABS
400.0000 mg | ORAL_TABLET | Freq: Once | ORAL | Status: AC
  Administered 2017-09-03: 400 mg via ORAL
  Filled 2017-09-03: qty 1

## 2017-09-03 NOTE — ED Notes (Addendum)
Attempted ambulation with walker and 2+ assist. Patient maintains strength in the BUE BLE, However it appears that the patient lacks coordination. Failed Independent ambulation

## 2017-09-03 NOTE — ED Notes (Signed)
Pt assisted with urinal to collect urine specimen

## 2017-09-03 NOTE — H&P (Signed)
[place] [facility] [place] NAME: Douglas Silva    MR#:  [id number]  DATE OF BIRTH:  02-11-1951  DATE OF ADMISSION:  09/03/2017  PRIMARY CARE PHYSICIAN: [name], MD   REQUESTING/REFERRING PHYSICIAN:   CHIEF COMPLAINT:   Chief Complaint  Patient presents with  . Fall    HISTORY OF PRESENT ILLNESS: Douglas Silva  is a 66 y.o. male with a known history per below presented to the emergency room via EMS for mechanical fall, occurred at his home as patient was using his walker, tripped and fell backwards resulting in back pain acutely, no loss of consciousness, ER workup noted for acute kidney injury with creatinine of 2 with baseline creatinine normal, potassium 3.3, white count 26,000, urinalysis murky/brown in color/results still pending-may not be able to be performed, CT scan of the head/C-spine/L-spine were negative for acute process, mild tachycardia 101, the patient evaluated in the emergency room, no apparent distress, resting comfortably in bed, patient denies any infectious symptomatology, patient now be admitted for acute kidney injury, status post mechanical fall, acute leukocytosis secondary to unknown etiology, and severe Parkinson's disease. PAST MEDICAL HISTORY:   Past Medical History:  Diagnosis Date  . Atrial fibrillation ([place])   . Cardiomyopathy ([place])   . Coronary artery disease   . Hypertension   . Hypothyroidism   . Parkinson's disease ([place])     PAST SURGICAL HISTORY:  Past Surgical History:  Procedure Laterality Date  . CORONARY ANGIOPLASTY      SOCIAL HISTORY:  Social History   Tobacco Use  . Smoking status: Never Smoker  . Smokeless tobacco: Never Used  Substance Use Topics  . Alcohol use: No    FAMILY HISTORY:  Family History  Problem Relation Age of Onset  . Lung cancer Mother   . Lung cancer Father     DRUG ALLERGIES: No Known Allergies  REVIEW OF SYSTEMS:   CONSTITUTIONAL: No fever,   +fatigue/weakness.  EYES: No blurred or double vision.  EARS, NOSE, AND THROAT: No tinnitus or ear pain.  RESPIRATORY: No cough, shortness of breath, wheezing or hemoptysis.  CARDIOVASCULAR: No chest pain, orthopnea, edema.  GASTROINTESTINAL: No nausea, vomiting, diarrhea or abdominal pain.  GENITOURINARY: No dysuria, hematuria.  ENDOCRINE: No polyuria, nocturia,  HEMATOLOGY: No anemia, easy bruising or bleeding SKIN: No rash or lesion. MUSCULOSKELETAL: No joint pain or arthritis.  Back pain NEUROLOGIC: No tingling, numbness, weakness.  PSYCHIATRY: No anxiety or depression.   MEDICATIONS AT HOME:  Prior to Admission medications   Medication Sig Start Date End Date Taking? Authorizing Provider  ibuprofen (ADVIL,MOTRIN) 400 MG tablet Take 1 tablet (400 mg total) by mouth every 6 (six) hours as needed. Patient taking differently: Take 200 mg every 6 (six) hours as needed by mouth.  06/03/17  Yes Max Sane, MD  levothyroxine (SYNTHROID, LEVOTHROID) 100 MCG tablet Take 100 mcg by mouth daily before breakfast.   Yes [provider]  metoprolol succinate (TOPROL-XL) 25 MG 24 hr tablet Take 25 mg by mouth daily. 05/20/17  Yes [provider]  simvastatin (ZOCOR) 20 MG tablet Take 20 mg by mouth daily.   Yes [provider]  warfarin (COUMADIN) 5 MG tablet Take 5 mg by mouth daily. @@1700    Yes [provider]      PHYSICAL EXAMINATION:   VITAL SIGNS: Blood pressure 113/74, pulse 84, temperature 97.9 F (36.6 C), temperature source Oral, resp. rate (!) 23, SpO2 97 %.  GENERAL:  66 y.o.-year-old patient lying in the bed with no acute distress.  Malnourished appearance-mild, nontoxic-appearing, frail EYES: Pupils equal, round, reactive to light and accommodation. No scleral icterus. Extraocular muscles intact.  HEENT: Head atraumatic, normocephalic. Oropharynx and nasopharynx clear.  NECK:  Supple, no jugular venous distention. No thyroid enlargement, no  tenderness.  LUNGS: Normal breath sounds bilaterally, no wheezing, rales,rhonchi or crepitation. No use of accessory muscles of respiration.  CARDIOVASCULAR: S1, S2 normal. No murmurs, rubs, or gallops.  ABDOMEN: Soft, nontender, nondistended. Bowel sounds present. No organomegaly or mass.  EXTREMITIES: b/l LE edema, no cyanosis, or clubbing.  Diffuse muscular atrophy NEUROLOGIC: Cranial nerves II through XII are intact. MAES. Gait not checked.  Bilateral tremors noted PSYCHIATRIC: The patient is alert and oriented x 3.  SKIN: No obvious rash, lesion, or ulcer.   LABORATORY PANEL:   CBC Recent Labs  Lab 09/03/17 1754  WBC 26.0*  HGB 14.5  HCT 43.8  PLT 205  MCV 92.1  MCH 30.6  MCHC 33.2  RDW 13.6   ------------------------------------------------------------------------------------------------------------------  Chemistries  Recent Labs  Lab 09/03/17 1754  NA 137  K 3.3*  CL 102  CO2 24  GLUCOSE 92  BUN 38*  CREATININE 2.07*  CALCIUM 8.8*   ------------------------------------------------------------------------------------------------------------------ CrCl cannot be calculated (Unknown ideal weight.). ------------------------------------------------------------------------------------------------------------------ No results for input(s): TSH, T4TOTAL, T3FREE, THYROIDAB in the last 72 hours.  Invalid input(s): FREET3   Coagulation profile No results for input(s): INR, PROTIME in the last 168 hours. ------------------------------------------------------------------------------------------------------------------- No results for input(s): DDIMER in the last 72 hours. -------------------------------------------------------------------------------------------------------------------  Cardiac Enzymes No results for input(s): CKMB, TROPONINI, MYOGLOBIN in the last 168 hours.  Invalid input(s):  CK ------------------------------------------------------------------------------------------------------------------ Invalid input(s): POCBNP  ---------------------------------------------------------------------------------------------------------------  Urinalysis    Component Value Date/Time   COLORURINE BROWN (A) 09/03/2017 2124   APPEARANCEUR TURBID (A) 09/03/2017 2124   LABSPEC 1.017 09/03/2017 2124   PHURINE  09/03/2017 2124    TEST NOT REPORTED DUE TO COLOR INTERFERENCE OF URINE PIGMENT   GLUCOSEU (A) 09/03/2017 2124    TEST NOT REPORTED DUE TO COLOR INTERFERENCE OF URINE PIGMENT   HGBUR (A) 09/03/2017 2124    TEST NOT REPORTED DUE TO COLOR INTERFERENCE OF URINE PIGMENT   BILIRUBINUR (A) 09/03/2017 2124    TEST NOT REPORTED DUE TO COLOR INTERFERENCE OF URINE PIGMENT   KETONESUR (A) 09/03/2017 2124    TEST NOT REPORTED DUE TO COLOR INTERFERENCE OF URINE PIGMENT   PROTEINUR (A) 09/03/2017 2124    TEST NOT REPORTED DUE TO COLOR INTERFERENCE OF URINE PIGMENT   NITRITE (A) 09/03/2017 2124    TEST NOT REPORTED DUE TO COLOR INTERFERENCE OF URINE PIGMENT   LEUKOCYTESUR (A) 09/03/2017 2124    TEST NOT REPORTED DUE TO COLOR INTERFERENCE OF URINE PIGMENT     RADIOLOGY: Ct Head Wo Contrast  Result Date: 09/03/2017 CLINICAL DATA:  Advanced Parkinson's disease. Fall. Low back pain and neck pain. EXAM: CT HEAD WITHOUT CONTRAST CT CERVICAL SPINE WITHOUT CONTRAST TECHNIQUE: Multidetector CT imaging of the head and cervical spine was performed following the standard protocol without intravenous contrast. Multiplanar CT image reconstructions of the cervical spine were also generated. COMPARISON:  None. FINDINGS: CT HEAD FINDINGS Brain: No acute intracranial hemorrhage. No focal mass lesion. No CT evidence of acute infarction. No midline shift or mass effect. No hydrocephalus. Basilar cisterns are patent. Vascular: No hyperdense vessel or unexpected calcification. Skull: Normal. Negative for  fracture or focal lesion. Sinuses/Orbits: Paranasal sinuses and mastoid air cells are clear. Orbits  are clear. Other: None. CT CERVICAL SPINE FINDINGS Alignment: Reversal normal cervical lordosis.  No subluxation. Skull base and vertebrae: Normal craniocervical junction. No loss of bowel vertebral body height or disc height. Normal facet articulation. No evidence of fracture. Soft tissues and spinal canal: No prevertebral soft tissue swelling. No perispinal or epidural hematoma. Disc levels:  Unremarkable Upper chest: Clear Other: None IMPRESSION: 1. No intracranial trauma. 2. No cervical spine fracture. 3. Straightening of the normal cervical lordosis may be secondary to position, muscle spasm, or ligamentous injury. Electronically Signed   By: Suzy Bouchard M.D.   On: 09/03/2017 16:30   Ct Cervical Spine Wo Contrast  Result Date: 09/03/2017 CLINICAL DATA:  Advanced Parkinson's disease. Fall. Low back pain and neck pain. EXAM: CT HEAD WITHOUT CONTRAST CT CERVICAL SPINE WITHOUT CONTRAST TECHNIQUE: Multidetector CT imaging of the head and cervical spine was performed following the standard protocol without intravenous contrast. Multiplanar CT image reconstructions of the cervical spine were also generated. COMPARISON:  None. FINDINGS: CT HEAD FINDINGS Brain: No acute intracranial hemorrhage. No focal mass lesion. No CT evidence of acute infarction. No midline shift or mass effect. No hydrocephalus. Basilar cisterns are patent. Vascular: No hyperdense vessel or unexpected calcification. Skull: Normal. Negative for fracture or focal lesion. Sinuses/Orbits: Paranasal sinuses and mastoid air cells are clear. Orbits are clear. Other: None. CT CERVICAL SPINE FINDINGS Alignment: Reversal normal cervical lordosis.  No subluxation. Skull base and vertebrae: Normal craniocervical junction. No loss of bowel vertebral body height or disc height. Normal facet articulation. No evidence of fracture. Soft tissues and spinal  canal: No prevertebral soft tissue swelling. No perispinal or epidural hematoma. Disc levels:  Unremarkable Upper chest: Clear Other: None IMPRESSION: 1. No intracranial trauma. 2. No cervical spine fracture. 3. Straightening of the normal cervical lordosis may be secondary to position, muscle spasm, or ligamentous injury. Electronically Signed   By: Suzy Bouchard M.D.   On: 09/03/2017 16:30   Ct Lumbar Spine Wo Contrast  Result Date: 09/03/2017 CLINICAL DATA:  66 y/o  M; status post fall with lower back pain. EXAM: CT LUMBAR SPINE WITHOUT CONTRAST TECHNIQUE: Multidetector CT imaging of the lumbar spine was performed without intravenous contrast administration. Multiplanar CT image reconstructions were also generated. COMPARISON:  None. FINDINGS: Segmentation: Rudimentary L1 vertebral body ribs. Last complete intervertebral disc labeled as L5-S1. Alignment: Mild lumbar levocurvature with apex at L4. Straightening of lumbar lordosis without listhesis. Vertebrae: No acute fracture or focal pathologic process. Paraspinal and other soft tissues: Calcific aortic atherosclerosis. Disc levels: Lumbar spondylosis with prominent discogenic degenerative changes at L4-5 and L5-S1 levels. History facet disease contributes to mild canal stenosis at L4-5 and L5-S1. Disc bulges and endplate marginal osteophytes combined with ligamentum flavum and facet hypertrophy result in foraminal stenosis that is mild at the left L2-3 through L5-S1 neural foramen and on the right at L3-4 and L5-S1. The right L4-5 neural foramen is moderately stenosed. IMPRESSION: 1. No acute fracture or dislocation identified. 2. Mild lumbar levocurvature and spondylosis greatest at L4-5 and L5-S1 levels. 3. Multifactorial mild L4-5 and L5-S1 canal stenosis. 4. Multilevel mild foraminal stenosis with moderate right L4-5 foraminal stenosis. 5. Abdominal mild aortic calcific atherosclerosis. Electronically Signed   By: Kristine Garbe M.D.   On:  09/03/2017 16:31   Dg Chest Portable 1 View  Result Date: 09/03/2017 CLINICAL DATA:  Congestion EXAM: PORTABLE CHEST 1 VIEW COMPARISON:  05/11/2007 FINDINGS: No consolidation or effusion.  Mild cardiomegaly.  No pneumothorax. IMPRESSION: Mild cardiomegaly.  Negative for edema or focal pulmonary infiltrate. Electronically Signed   By: Donavan Foil M.D.   On: 09/03/2017 20:13    EKG: Orders placed or performed during the hospital encounter of 09/03/17  . ED EKG  . ED EKG    IMPRESSION AND PLAN: 1 acute kidney injury Most likely secondary to combination of NSAID use and poor p.o. Intake BL creatinine normal  Admit to regular nursing floor bed, avoid nephrotoxic agents, strict I&O monitoring, daily weights, IV fluids for rehydration, check renal ultrasound, consider nephrology consultation if any acute worsening  2 acute mechanical fall Noted history of severe Parkinson's disease PT/OT to evaluate/treat, case management to assist with disposition planning  3 acute leukocytosis Etiology unknown Patient without infectious symptomatology Noted urinalysis which is murky/brown in color-we will rehydrate, avoid antibiotics at this time, recheck urinalysis in the morning  4 chronic atrial fibrillation on Coumadin Stable Check PT/INR now, continue Coumadin and Lopressor  5 chronic hypothyroidism, unspecified Continue Synthroid and check TSH  6 chronic severe Parkinson's disease Lives alone, uses walker for mobility Fall/aspiration/skin care precautions ?  Need for placement versus discharge home in the care of family  7 chronic benign essential hypertension Stable Continue Lopressor  Full code Condition stable Prognosis fair DVT prophylaxis-on Coumadin Disposition to home with home health services versus home with family versus inpatient rehab in 2-3 days    All the records are reviewed and case discussed with ED provider. Management plans discussed with the patient, family  and they are in agreement.  CODE STATUS: Code Status History    Date Active Date Inactive Code Status Order ID Comments User Context   06/01/2017 20:14 06/03/2017 15:06 Full Code [id number]  Loletha Grayer, MD ED       TOTAL TIME TAKING CARE OF THIS PATIENT: 40 minutes.    Avel Peace Iliyah Bui M.D on 09/03/2017   Between 7am to 6pm - Pager - [phone]  After 6pm go to [email or web address] - password [email or web address] [facility]  Office  [phone]  CC: Primary care physician; [name], MD   Note: This dictation was prepared with Dragon dictation along with smaller phrase technology. Any transcriptional errors that result from this process are unintentional.

## 2017-09-03 NOTE — ED Notes (Signed)
Visitor at bedside. Pt given warm blankets.

## 2017-09-03 NOTE — ED Notes (Signed)
Admitting MD just finishing up with pt; pt given more water to drink; denies pain; understands waiting on admitting room for report and transfer; call bell in reach; side rails up for safety

## 2017-09-03 NOTE — ED Notes (Signed)
Pt says he is still unable to void, but "getting close"

## 2017-09-03 NOTE — ED Provider Notes (Addendum)
[facility] Emergency Department Provider Note ____________________________________________   First MD Initiated Contact with Patient 09/03/17 1516     (approximate)  I have reviewed the triage vital signs and the nursing notes.   HISTORY  Chief Complaint Fall    HPI Avyukth Bontempo. is a 66 y.o. male with a history of atrial fibrillation on warfarin and Parkinson's disease who presents with back pain, acute onset a few hours ago after a mechanical fall, not associated with any numbness or weakness.  Patient states that he was walking with his walker, and lost his footing due to his chronic movement issues with the Parkinson's.  He states he fell and mainly hit his back but also may have hit his head.  Patient denies loss of consciousness.  He denies any significant pain currently.  He states that he did not feel dizzy or weak before the fall, and has been in his usual state of health in the last several days.  Past Medical History:  Diagnosis Date  . Atrial fibrillation ([place])   . Cardiomyopathy ([place])   . Coronary artery disease   . Hypertension   . Hypothyroidism   . Parkinson's disease [facility])     Patient Active Problem List   Diagnosis Date Noted  . Cellulitis 06/01/2017    Past Surgical History:  Procedure Laterality Date  . CORONARY ANGIOPLASTY      Prior to Admission medications   Medication Sig Start Date End Date Taking? Authorizing Provider  ibuprofen (ADVIL,MOTRIN) 400 MG tablet Take 1 tablet (400 mg total) by mouth every 6 (six) hours as needed. 06/03/17   Max Sane, MD  levothyroxine (SYNTHROID, LEVOTHROID) 100 MCG tablet Take 100 mcg by mouth daily before breakfast.    [provider]  metoprolol succinate (TOPROL-XL) 25 MG 24 hr tablet Take 25 mg by mouth daily. 05/20/17   [provider]  simvastatin (ZOCOR) 20 MG tablet Take 20 mg by mouth daily.    [provider]  warfarin (COUMADIN) 5 MG tablet  Take 5 mg by mouth daily. @@1700     [provider]    Allergies Patient has no known allergies.  Family History  Problem Relation Age of Onset  . Lung cancer Mother   . Lung cancer Father     Social History Social History   Tobacco Use  . Smoking status: Never Smoker  . Smokeless tobacco: Never Used  Substance Use Topics  . Alcohol use: No  . Drug use: No    Review of Systems  Constitutional: No fever.  No general weakness.  Eyes: No visual changes. ENT: No sore throat. Cardiovascular: Denies chest pain. Respiratory: Denies shortness of breath. Gastrointestinal: No nausea, no vomiting.  Genitourinary: Negative for dysuria.  Musculoskeletal: Positive for back pain. Skin: Negative for rash. Neurological: Negative for headache.   ____________________________________________   PHYSICAL EXAM:  VITAL SIGNS: ED Triage Vitals [09/03/17 1505]  Enc Vitals Group     BP 127/69     Pulse Rate 100     Resp      Temp 97.9 F (36.6 C)     Temp Source Oral     SpO2 100 %     Weight      Height      Head Circumference      Peak Flow      Pain Score      Pain Loc      Pain Edu?  Excl. in [place]?     Constitutional: Alert and oriented. Well appearing and in no acute distress. Eyes: Conjunctivae are normal. EOMI. PERRLA.  Head: Atraumatic. Nose: No congestion/rhinnorhea. Mouth/Throat: Mucous membranes are moist.   Neck: Normal range of motion.  Very mild midline C-spine tenderness. Cardiovascular: Good peripheral circulation. Respiratory: Normal respiratory effort.  No retractions.  Gastrointestinal: Soft and nontender. No distention.  Genitourinary: No CVA tenderness. Musculoskeletal: No lower extremity edema.  Extremities warm and well perfused.  Range of motion in all joints.  Mild tenderness to the lumbar spine. Neurologic:  Normal speech and language. No gross focal neurologic deficits are appreciated.  5/5 motor strength and intact sensation to all  extremities.  Cranial nerves III through XII intact.  Parkinson's type tremor. Skin:  Skin is warm and dry. No rash noted. Psychiatric: Mood and affect are normal. Speech and behavior are normal.  ____________________________________________   LABS (all labs ordered are listed, but only abnormal results are displayed)  Labs Reviewed  BASIC METABOLIC PANEL - Abnormal; Notable for the following components:      Result Value   Potassium 3.3 (*)    BUN 38 (*)    Creatinine, Ser 2.07 (*)    Calcium 8.8 (*)    GFR calc non Af Amer 32 (*)    GFR calc Af Amer 37 (*)    All other components within normal limits  CBC - Abnormal; Notable for the following components:   WBC 26.0 (*)    All other components within normal limits  URINALYSIS, COMPLETE (UACMP) WITH MICROSCOPIC - Abnormal; Notable for the following components:   Color, Urine BROWN (*)    APPearance TURBID (*)    Glucose, UA   (*)    Value: TEST NOT REPORTED DUE TO COLOR INTERFERENCE OF URINE PIGMENT   Hgb urine dipstick   (*)    Value: TEST NOT REPORTED DUE TO COLOR INTERFERENCE OF URINE PIGMENT   Bilirubin Urine   (*)    Value: TEST NOT REPORTED DUE TO COLOR INTERFERENCE OF URINE PIGMENT   Ketones, ur   (*)    Value: TEST NOT REPORTED DUE TO COLOR INTERFERENCE OF URINE PIGMENT   Protein, ur   (*)    Value: TEST NOT REPORTED DUE TO COLOR INTERFERENCE OF URINE PIGMENT   Nitrite   (*)    Value: TEST NOT REPORTED DUE TO COLOR INTERFERENCE OF URINE PIGMENT   Leukocytes, UA   (*)    Value: TEST NOT REPORTED DUE TO COLOR INTERFERENCE OF URINE PIGMENT   Bacteria, UA FEW (*)    Squamous Epithelial / LPF 0-5 (*)    All other components within normal limits   ____________________________________________  EKG   ____________________________________________  RADIOLOGY  CT head: No ICH CT cspine: no acute fracture CT Lspine: no acute fracture  ____________________________________________   PROCEDURES  Procedure(s)  performed: No    Critical Care performed: No ____________________________________________   INITIAL IMPRESSION / ASSESSMENT AND PLAN / ED COURSE  Pertinent labs & imaging results that were available during my care of the patient were reviewed by me and considered in my medical decision making (see chart for details).  66 year old male with history of Parkinson's and atrial fibrillation on Coumadin, as well as other past medical history as noted above presents with a mechanical fall while using his walker and resulting back pain, and possible minor head injury.  Past medical records reviewed in Epic and are noncontributory.  On exam, patient is  relatively well-appearing, vital signs are normal, and the exam is significant only for very mild C-spine and L-spine tenderness with no step-off or crepitus.  Neuro is nonfocal.  Patient is on warfarin, will obtain a CT head to rule out ICH, and will obtain CTs of the C and L-spine to rule out fracture.  No evidence of medical precipitating cause, or indication for a medical workup.    ----------------------------------------- 5:09 PM on 09/03/2017 -----------------------------------------  No acute findings on imaging.  Patient feels well to go home.  Return precautions given, and patient expresses understanding.  ----------------------------------------- 6:02 PM on 09/03/2017 -----------------------------------------  The patient's friend who had helped him after he fell but is now in the emergency department, and I was able to speak to him.  He is concerned that the patient was unable to help himself up off the floor at all and had difficulty with moving his legs.  On reassessment, patient remains with an intact neurologic exam; he has full strength to bilateral lower extremities when tested in bed.  However we performed a trial of ambulation, and patient is completely unable to ambulate even with assistance from 2 people in his walker.  His  strength is intact, but he seems to have decreased coordination and increased tremor.  He otherwise has no new coordination problems, and there is no evidence of acute CVA or other CNS cause beyond the Parkinson's.    At this time, patient is not safe for discharge home given his current level of care.  He will need to stay for social work and PT evaluations.  We will also obtain basic labs and a UA given this now evident change from his baseline.   ----------------------------------------- 9:50 PM on 09/03/2017 -----------------------------------------  Patient's lab workup revealed WBC count of 26, as well as worsened creatinine from baseline.  Due to the WBC I added infection workup; chest x-ray obtained and shows no signs of pneumonia, and we are awaiting a UA to rule out UTI.  Patient will require admission for acute renal insufficiency.  ----------------------------------------- 10:29 PM on 09/03/2017 -----------------------------------------  Urine is brown and turbid, but no WBCs or other signs of acute infection.  I discussed the case with hospitalist Dr. Clyda Hurdle, and will proceed with admission.  No indication for empiric antibiotics at this time.  ____________________________________________   FINAL CLINICAL IMPRESSION(S) / ED DIAGNOSES  Final diagnoses:  Fall, initial encounter  Acute renal insufficiency      NEW MEDICATIONS STARTED DURING THIS VISIT:  This SmartLink is deprecated. Use AVSMEDLIST instead to display the medication list for a patient.   Note:  This document was prepared using Dragon voice recognition software and may include unintentional dictation errors.    Arta Silence, MD 09/03/17 1937    Arta Silence, MD 09/03/17 2230

## 2017-09-03 NOTE — ED Notes (Signed)
Xray tech Geoff at bedside; pt given more water to drink; unable to void at this time

## 2017-09-03 NOTE — ED Notes (Signed)
MD at bedside speaking to pt and friend about pts status

## 2017-09-03 NOTE — ED Notes (Signed)
Pt given meal tray. Eating with no issues.

## 2017-09-03 NOTE — ED Notes (Signed)
Pt understands urine specimen is needed; unable to void at this time; urinal at bedside; pt encouraged to call for any needs

## 2017-09-03 NOTE — ED Notes (Signed)
Pt on phone with sister, [name]

## 2017-09-03 NOTE — ED Notes (Signed)
Pts clothing dirty from stool. Pt cleaned, brief put on, clothes in patient belonging bag .

## 2017-09-03 NOTE — Discharge Instructions (Signed)
Return to the ER for new or worsening pain, weakness, difficulty or inability to walk, or any other new or worsening symptoms that concern you.

## 2017-09-03 NOTE — ED Notes (Signed)
Pt ride on the way   

## 2017-09-03 NOTE — ED Triage Notes (Signed)
Pt came to ED via EMS from home. Pt has advanced parkinsons, has healthcare workers check on him. Pt took a fall, was not c/o any pain, now c/o back pain. VS stable.

## 2017-09-04 ENCOUNTER — Inpatient Hospital Stay: Payer: Medicare Other

## 2017-09-04 LAB — BASIC METABOLIC PANEL
Anion gap: 12 (ref 5–15)
BUN: 49 mg/dL — AB (ref 6–20)
CO2: 23 mmol/L (ref 22–32)
CREATININE: 2.57 mg/dL — AB (ref 0.61–1.24)
Calcium: 8.5 mg/dL — ABNORMAL LOW (ref 8.9–10.3)
Chloride: 102 mmol/L (ref 101–111)
GFR calc Af Amer: 28 mL/min — ABNORMAL LOW (ref >60)
GFR, EST NON AFRICAN AMERICAN: 25 mL/min — AB (ref >60)
Glucose, Bld: 116 mg/dL — ABNORMAL HIGH (ref 65–99)
Potassium: 3.7 mmol/L (ref 3.5–5.1)
SODIUM: 137 mmol/L (ref 135–145)

## 2017-09-04 LAB — CK: Total CK: 39269 U/L — ABNORMAL HIGH (ref 49–397)

## 2017-09-04 LAB — PROTIME-INR
INR: 3.05
Prothrombin Time: 31.3 seconds — ABNORMAL HIGH (ref 11.4–15.2)

## 2017-09-04 LAB — CBC
HCT: 41.1 % (ref 40.0–52.0)
Hemoglobin: 13.6 g/dL (ref 13.0–18.0)
MCH: 30.4 pg (ref 26.0–34.0)
MCHC: 33.2 g/dL (ref 32.0–36.0)
MCV: 91.8 fL (ref 80.0–100.0)
PLATELETS: 206 10*3/uL (ref 150–440)
RBC: 4.48 MIL/uL (ref 4.40–5.90)
RDW: 13.6 % (ref 11.5–14.5)
WBC: 18 10*3/uL — AB (ref 3.8–10.6)

## 2017-09-04 LAB — PREALBUMIN: Prealbumin: 22.3 mg/dL (ref 18–38)

## 2017-09-04 LAB — PSA: PROSTATIC SPECIFIC ANTIGEN: 0.57 ng/mL (ref 0.00–4.00)

## 2017-09-04 LAB — TSH: TSH: 1.601 u[IU]/mL (ref 0.350–4.500)

## 2017-09-04 MED ORDER — SODIUM CHLORIDE 0.9% FLUSH
3.0000 mL | Freq: Two times a day (BID) | INTRAVENOUS | Status: DC
  Administered 2017-09-04 – 2017-09-16 (×17): 3 mL via INTRAVENOUS

## 2017-09-04 MED ORDER — FINASTERIDE 5 MG PO TABS
5.0000 mg | ORAL_TABLET | Freq: Every day | ORAL | Status: DC
  Administered 2017-09-04 – 2017-09-16 (×13): 5 mg via ORAL
  Filled 2017-09-04 (×13): qty 1

## 2017-09-04 MED ORDER — ONDANSETRON HCL 4 MG/2ML IJ SOLN: 4.0000 mg | Freq: Four times a day (QID) | INTRAMUSCULAR | Status: DC | PRN

## 2017-09-04 MED ORDER — LEVOTHYROXINE SODIUM 100 MCG PO TABS
100.0000 ug | ORAL_TABLET | Freq: Every day | ORAL | Status: DC
  Administered 2017-09-04 – 2017-09-16 (×13): 100 ug via ORAL
  Filled 2017-09-04 (×13): qty 1

## 2017-09-04 MED ORDER — POTASSIUM CHLORIDE CRYS ER 20 MEQ PO TBCR
20.0000 meq | EXTENDED_RELEASE_TABLET | Freq: Once | ORAL | Status: AC
  Administered 2017-09-04: 20 meq via ORAL

## 2017-09-04 MED ORDER — ADULT MULTIVITAMIN W/MINERALS CH
1.0000 | ORAL_TABLET | Freq: Every day | ORAL | Status: DC
  Administered 2017-09-04 – 2017-09-16 (×12): 1 via ORAL
  Filled 2017-09-04 (×13): qty 1

## 2017-09-04 MED ORDER — SODIUM CHLORIDE 0.9 % IV SOLN
INTRAVENOUS | Status: DC
  Administered 2017-09-04: 09:00:00 via INTRAVENOUS
  Administered 2017-09-04: 75 mL/h via INTRAVENOUS
  Administered 2017-09-06 – 2017-09-13 (×7): via INTRAVENOUS

## 2017-09-04 MED ORDER — METOPROLOL SUCCINATE ER 25 MG PO TB24
25.0000 mg | ORAL_TABLET | Freq: Every day | ORAL | Status: DC
  Administered 2017-09-04: 25 mg via ORAL
  Filled 2017-09-04: qty 1

## 2017-09-04 MED ORDER — ACETAMINOPHEN 325 MG PO TABS: 650.0000 mg | ORAL_TABLET | Freq: Four times a day (QID) | ORAL | Status: DC | PRN

## 2017-09-04 MED ORDER — POLYETHYLENE GLYCOL 3350 17 G PO PACK: 17.0000 g | PACK | Freq: Every day | ORAL | Status: DC | PRN

## 2017-09-04 MED ORDER — ROPINIROLE HCL 0.25 MG PO TABS
0.5000 mg | ORAL_TABLET | Freq: Three times a day (TID) | ORAL | Status: DC
  Administered 2017-09-04 – 2017-09-16 (×43): 0.5 mg via ORAL
  Filled 2017-09-04 (×4): qty 2
  Filled 2017-09-04: qty 0.5
  Filled 2017-09-04: qty 1
  Filled 2017-09-04 (×3): qty 2
  Filled 2017-09-04 (×2): qty 1
  Filled 2017-09-04 (×6): qty 2
  Filled 2017-09-04: qty 1
  Filled 2017-09-04 (×12): qty 2
  Filled 2017-09-04: qty 1
  Filled 2017-09-04 (×7): qty 2
  Filled 2017-09-04 (×2): qty 1
  Filled 2017-09-04 (×5): qty 2
  Filled 2017-09-04: qty 1
  Filled 2017-09-04 (×3): qty 2
  Filled 2017-09-04: qty 1
  Filled 2017-09-04 (×2): qty 2

## 2017-09-04 MED ORDER — POTASSIUM CHLORIDE IN NACL 20-0.45 MEQ/L-% IV SOLN
INTRAVENOUS | Status: DC
  Administered 2017-09-04: 125 mL/h via INTRAVENOUS
  Filled 2017-09-04 (×4): qty 1000

## 2017-09-04 MED ORDER — ONDANSETRON HCL 4 MG PO TABS: 4.0000 mg | ORAL_TABLET | Freq: Four times a day (QID) | ORAL | Status: DC | PRN

## 2017-09-04 MED ORDER — POTASSIUM CHLORIDE CRYS ER 20 MEQ PO TBCR
EXTENDED_RELEASE_TABLET | ORAL | Status: AC
  Filled 2017-09-04: qty 1

## 2017-09-04 MED ORDER — METOPROLOL SUCCINATE ER 25 MG PO TB24
25.0000 mg | ORAL_TABLET | Freq: Every day | ORAL | Status: DC
  Administered 2017-09-05 – 2017-09-16 (×12): 25 mg via ORAL
  Filled 2017-09-04 (×12): qty 1

## 2017-09-04 MED ORDER — PNEUMOCOCCAL VAC POLYVALENT 25 MCG/0.5ML IJ INJ: 0.5000 mL | INJECTION | INTRAMUSCULAR | Status: DC

## 2017-09-04 MED ORDER — ACETAMINOPHEN 650 MG RE SUPP: 650.0000 mg | Freq: Four times a day (QID) | RECTAL | Status: DC | PRN

## 2017-09-04 MED ORDER — WARFARIN SODIUM 5 MG PO TABS: 5.0000 mg | ORAL_TABLET | Freq: Every day | ORAL | Status: DC

## 2017-09-04 MED ORDER — SIMVASTATIN 20 MG PO TABS
20.0000 mg | ORAL_TABLET | Freq: Every day | ORAL | Status: DC
  Administered 2017-09-04: 20 mg via ORAL
  Filled 2017-09-04: qty 1

## 2017-09-04 MED ORDER — HYDROCODONE-ACETAMINOPHEN 5-325 MG PO TABS
1.0000 | ORAL_TABLET | ORAL | Status: DC | PRN
  Administered 2017-09-04: 1 via ORAL
  Administered 2017-09-06 – 2017-09-07 (×2): 2 via ORAL
  Administered 2017-09-11: 1 via ORAL
  Filled 2017-09-04: qty 2
  Filled 2017-09-04: qty 1
  Filled 2017-09-04: qty 2
  Filled 2017-09-04: qty 1

## 2017-09-04 NOTE — ED Notes (Signed)
Patient transported to room  152 

## 2017-09-04 NOTE — Progress Notes (Signed)
ANTICOAGULATION CONSULT NOTE - Initial Consult  Pharmacy Consult for warfarin Indication: atrial fibrillation  No Known Allergies  Patient Measurements: Height: 5\' 11"  (180.3 cm) Weight: 162 lb 12.8 oz (73.8 kg) IBW/kg (Calculated) : 75.3 Heparin Dosing Weight: 73.8 kg  Vital Signs: Temp: 98.6 F (37 C) (11/11 0055) Temp Source: Oral (11/11 0055) BP: 113/71 (11/11 0055) Pulse Rate: 96 (11/11 0055)  Labs: Recent Labs    09/03/17 1754 09/04/17 0303  HGB 14.5 13.6  HCT 43.8 41.1  PLT 205 206  LABPROT  --  31.3*  INR  --  3.05  CREATININE 2.07* 2.57*    Estimated Creatinine Clearance: 29.9 mL/min (A) (by C-G formula based on SCr of 2.57 mg/dL (H)).   Medical History: Past Medical History:  Diagnosis Date  . Atrial fibrillation ([place])   . Cardiomyopathy ([place])   . Coronary artery disease   . Hypertension   . Hypothyroidism   . Parkinson's disease ([place])     Medications:  Scheduled:  . levothyroxine  100 mcg Oral QAC breakfast  . metoprolol succinate  25 mg Oral Daily  . multivitamin with minerals  1 tablet Oral Daily  . [START ON 09/05/2017] pneumococcal 23 valent vaccine  0.5 mL Intramuscular Tomorrow-1000  . potassium chloride SA      . simvastatin  20 mg Oral Daily  . sodium chloride flush  3 mL Intravenous Q12H    Assessment: Patient admitted for fall, is chronically anticoagulated w/ warfarin for afib.  11/11 INR 3.05 supratherapeutic  Goal of Therapy:  INR 2-3 Monitor platelets by anticoagulation protocol: Yes   Plan:  Will hold one dose of warfarin, then repeat INR w/ am labs and dose as appropriate. Will monitor daily CBC/INRs.  Tobie Lords, PharmD, BCPS Clinical Pharmacist 09/04/2017

## 2017-09-04 NOTE — Progress Notes (Signed)
Patient bladder scanned, 219cc found. MD notified. No new orders at this time.   Deri Fuelling, RN

## 2017-09-04 NOTE — ED Notes (Signed)
Margarita Grizzle, RN aware of bed assigned

## 2017-09-04 NOTE — NC FL2 (Signed)
[place] LEVEL OF CARE SCREENING TOOL     IDENTIFICATION  Patient Name: Douglas Silva. Birthdate: 26-Mar-1951 Sex: male Admission Date (Current Location): 09/03/2017  [place] and [place] Number:  Engineering geologist and Address:  [facility], [address], [place], [place] [address]      Provider Number: [id number]  Attending Physician Name and Address:  Loletha Grayer, MD  Relative Name and Phone Number:       Current Level of Care: Hospital Recommended Level of Care: [place] Prior Approval Number:    Date Approved/Denied:   PASRR Number: [id number]  Discharge Plan: SNF    Current Diagnoses: Patient Active Problem List   Diagnosis Date Noted  . AKI (acute kidney injury) ([place]) 09/03/2017  . Cellulitis 06/01/2017    Orientation RESPIRATION BLADDER Height & Weight     Self, Time, Situation, Place  Normal Continent Weight: 162 lb 12.8 oz (73.8 kg) Height:  5\' 11"  (180.3 cm)  BEHAVIORAL SYMPTOMS/MOOD NEUROLOGICAL BOWEL NUTRITION STATUS      Continent Diet(Heart Healthy)  AMBULATORY STATUS COMMUNICATION OF NEEDS Skin   Extensive Assist Verbally Normal                       Personal Care Assistance Level of Assistance  Bathing, Feeding, Dressing Bathing Assistance: Limited assistance Feeding assistance: Independent Dressing Assistance: Limited assistance     Functional Limitations Info  Speech, Hearing, Sight Sight Info: Adequate Hearing Info: Adequate Speech Info: Adequate    SPECIAL CARE FACTORS FREQUENCY  PT (By licensed PT), OT (By licensed OT)     PT Frequency: Up to 5X per day, 5 days per week OT Frequency: Up to 5X per day, 5 days per week            Contractures Contractures Info: Not present    Additional Factors Info  Code Status, Allergies Code Status Info: Full Allergies Info: No Known Allergies           Current Medications (09/04/2017):  This is the  current hospital active medication list Current Facility-Administered Medications  Medication Dose Route Frequency Provider Last Rate Last Dose  . 0.9 %  sodium chloride infusion   Intravenous Continuous Loletha Grayer, MD 75 mL/hr at 09/04/17 0912    . acetaminophen (TYLENOL) tablet 650 mg  650 mg Oral Q6H PRN Salary, Montell D, MD       Or  . acetaminophen (TYLENOL) suppository 650 mg  650 mg Rectal Q6H PRN Salary, Montell D, MD      . finasteride (PROSCAR) tablet 5 mg  5 mg Oral Daily Loletha Grayer, MD   5 mg at 09/04/17 1522  . HYDROcodone-acetaminophen (NORCO/VICODIN) 5-325 MG per tablet 1-2 tablet  1-2 tablet Oral Q4H PRN Salary, Avel Peace, MD   1 tablet at 09/04/17 0250  . levothyroxine (SYNTHROID, LEVOTHROID) tablet 100 mcg  100 mcg Oral QAC breakfast Salary, Holly Bodily D, MD   100 mcg at 09/04/17 0738  . [START ON 09/05/2017] metoprolol succinate (TOPROL-XL) 24 hr tablet 25 mg  25 mg Oral Daily Wieting, Richard, MD      . multivitamin with minerals tablet 1 tablet  1 tablet Oral Daily Salary, Montell D, MD   1 tablet at 09/04/17 0911  . ondansetron (ZOFRAN) tablet 4 mg  4 mg Oral Q6H PRN Salary, Montell D, MD       Or  . ondansetron (ZOFRAN) injection 4 mg  4 mg  Intravenous Q6H PRN Salary, Avel Peace, MD      . Derrill Memo ON 09/05/2017] pneumococcal 23 valent vaccine (PNU-IMMUNE) injection 0.5 mL  0.5 mL Intramuscular Tomorrow-1000 Salary, Montell D, MD      . polyethylene glycol (MIRALAX / GLYCOLAX) packet 17 g  17 g Oral Daily PRN Salary, Montell D, MD      . rOPINIRole (REQUIP) tablet 0.5 mg  0.5 mg Oral TID WC & HS Leslye Peer, Richard, MD   0.5 mg at 09/04/17 1233  . simvastatin (ZOCOR) tablet 20 mg  20 mg Oral Daily Salary, Montell D, MD      . sodium chloride flush (NS) 0.9 % injection 3 mL  3 mL Intravenous Q12H Salary, Montell D, MD   3 mL at 09/04/17 0912     Discharge Medications: Please see discharge summary for a list of discharge medications.  Relevant Imaging  Results:  Relevant Lab Results:   Additional Information SS# [id number]  Zettie Pho, LCSW

## 2017-09-04 NOTE — Consult Note (Signed)
[facility]  CONSULT NOTE    Date: 09/04/2017                  Patient Name:  Douglas Silva.  MRN: [id number]  DOB: [birth year]  Age / Sex: 66 y.o., male         PCP: [name], MD                 Service Requesting Consult: Dr. Earleen Newport                 Reason for Consult: Acute Renal Failure            History of Present Illness: Douglas Silva. is a 66 y.o. white male with Parkinson's, hypertension, hypothyroidism, hyperlipidemia, atrial fibrillation on warfarin who was admitted to [facility] on 09/03/2017 for Acute renal insufficiency [N28.9] AKI (acute kidney injury) ([place]) [N17.9] Fall, initial encounter [W19.XXXA]  Patient baseline creatinine of 0.87 on 06/02/17. Admitted after a fall. Patient states he has not been eating or drinking well. Creatinine on admission of  2.07 with hypokalemia. Started on 1/2NS with 20KCl with improvement in potassium but not with creatinine.   Seems to have some urinary incontinence. Bladder scan of 240mL. Renal ultrasound shows enlarged prostate but with no hydronephrosis.   Denies nausea/vomiting/diarrhea. Denies lightheadedness, dizziness.    Medications: Outpatient medications: Medications Prior to Admission  Medication Sig Dispense Refill Last Dose  . ibuprofen (ADVIL,MOTRIN) 400 MG tablet Take 1 tablet (400 mg total) by mouth every 6 (six) hours as needed. (Patient taking differently: Take 200 mg every 6 (six) hours as needed by mouth. ) 30 tablet 0 prn at prn  . levothyroxine (SYNTHROID, LEVOTHROID) 100 MCG tablet Take 100 mcg by mouth daily before breakfast.   09/03/2017 at Unknown time  . metoprolol succinate (TOPROL-XL) 25 MG 24 hr tablet Take 25 mg by mouth daily.   09/03/2017 at Unknown time  . rOPINIRole (REQUIP) 0.5 MG tablet Take 1 tablet 4 (four) times daily by mouth.   Past Week at Unknown time  . simvastatin (ZOCOR) 20 MG tablet Take 20 mg by mouth daily.   09/03/2017 at Unknown time  .  warfarin (COUMADIN) 5 MG tablet Take 5 mg by mouth daily. @@1700    09/03/2017 at 1700    Current medications: Current Facility-Administered Medications  Medication Dose Route Frequency Provider Last Rate Last Dose  . 0.9 %  sodium chloride infusion   Intravenous Continuous Loletha Grayer, MD 75 mL/hr at 09/04/17 0912    . acetaminophen (TYLENOL) tablet 650 mg  650 mg Oral Q6H PRN Salary, Montell D, MD       Or  . acetaminophen (TYLENOL) suppository 650 mg  650 mg Rectal Q6H PRN Salary, Montell D, MD      . HYDROcodone-acetaminophen (NORCO/VICODIN) 5-325 MG per tablet 1-2 tablet  1-2 tablet Oral Q4H PRN Salary, Avel Peace, MD   1 tablet at 09/04/17 0250  . levothyroxine (SYNTHROID, LEVOTHROID) tablet 100 mcg  100 mcg Oral QAC breakfast Salary, Holly Bodily D, MD   100 mcg at 09/04/17 0738  . metoprolol succinate (TOPROL-XL) 24 hr tablet 25 mg  25 mg Oral Daily Salary, Montell D, MD   25 mg at 09/04/17 0911  . multivitamin with minerals tablet 1 tablet  1 tablet Oral Daily Salary, Avel Peace, MD   1 tablet at 09/04/17 0911  . ondansetron (ZOFRAN) tablet 4 mg  4 mg Oral Q6H PRN Salary, Montell  D, MD       Or  . ondansetron (ZOFRAN) injection 4 mg  4 mg Intravenous Q6H PRN Salary, Montell D, MD      . Derrill Memo ON 09/05/2017] pneumococcal 23 valent vaccine (PNU-IMMUNE) injection 0.5 mL  0.5 mL Intramuscular Tomorrow-1000 Salary, Montell D, MD      . polyethylene glycol (MIRALAX / GLYCOLAX) packet 17 g  17 g Oral Daily PRN Salary, Montell D, MD      . rOPINIRole (REQUIP) tablet 0.5 mg  0.5 mg Oral TID WC & HS Loletha Grayer, MD   0.5 mg at 09/04/17 1233  . simvastatin (ZOCOR) tablet 20 mg  20 mg Oral Daily Salary, Montell D, MD      . sodium chloride flush (NS) 0.9 % injection 3 mL  3 mL Intravenous Q12H Salary, Montell D, MD   3 mL at 09/04/17 0912      Allergies: No Known Allergies    Past Medical History: Past Medical History:  Diagnosis Date  . Atrial fibrillation ([place])   . Cardiomyopathy  ([place])   . Coronary artery disease   . Hypertension   . Hypothyroidism   . Parkinson's disease [facility])      Past Surgical History: Past Surgical History:  Procedure Laterality Date  . CORONARY ANGIOPLASTY       Family History: Family History  Problem Relation Age of Onset  . Lung cancer Mother   . Lung cancer Father      Social History: Social History   Socioeconomic History  . Marital status: Divorced    Spouse name: Not on file  . Number of children: Not on file  . Years of education: Not on file  . Highest education level: Not on file  Social Needs  . Financial resource strain: Not on file  . Food insecurity - worry: Not on file  . Food insecurity - inability: Not on file  . Transportation needs - medical: Not on file  . Transportation needs - non-medical: Not on file  Occupational History  . Not on file  Tobacco Use  . Smoking status: Never Smoker  . Smokeless tobacco: Never Used  Substance and Sexual Activity  . Alcohol use: No  . Drug use: No  . Sexual activity: Not on file  Other Topics Concern  . Not on file  Social History Narrative  . Not on file     Review of Systems: Review of Systems  Constitutional: Negative for chills, diaphoresis, fever, malaise/fatigue and weight loss.  HENT: Negative.  Negative for congestion, ear discharge, ear pain, hearing loss, nosebleeds, sinus pain, sore throat and tinnitus.   Eyes: Negative.  Negative for blurred vision, double vision, photophobia, pain, discharge and redness.  Respiratory: Negative.  Negative for cough, hemoptysis, sputum production, shortness of breath, wheezing and stridor.   Cardiovascular: Negative.  Negative for chest pain, palpitations, orthopnea, claudication, leg swelling and PND.  Gastrointestinal: Negative.  Negative for abdominal pain, blood in stool, constipation, diarrhea, heartburn, melena, nausea and vomiting.  Genitourinary: Positive for frequency and urgency. Negative for dysuria,  flank pain and hematuria.  Musculoskeletal: Negative.  Negative for back pain, falls, joint pain, myalgias and neck pain.  Skin: Negative.  Negative for itching and rash.  Neurological: Negative for dizziness, tingling, tremors, sensory change, speech change, focal weakness, seizures, loss of consciousness, weakness and headaches.       Tremor  Endo/Heme/Allergies: Negative.  Negative for environmental allergies and polydipsia. Does not bruise/bleed easily.  Psychiatric/Behavioral: Negative.  Negative for depression, hallucinations, memory loss, substance abuse and suicidal ideas. The patient is not nervous/anxious and does not have insomnia.     Vital Signs: Blood pressure 100/72, pulse 87, temperature 98.5 F (36.9 C), temperature source Oral, resp. rate 18, height 5\' 11"  (1.803 m), weight 73.8 kg (162 lb 12.8 oz), SpO2 99 %.  Weight trends: Filed Weights   09/04/17 0054 09/04/17 0500  Weight: 73.8 kg (162 lb 12.8 oz) 73.8 kg (162 lb 12.8 oz)    Physical Exam: General: NAD, laying in bed  Head: Normocephalic, atraumatic. Moist oral mucosal membranes  Eyes: Anicteric, PERRL  Neck: Supple, trachea midline  Lungs:  Clear to auscultation  Heart: Regular rate and rhythm  Abdomen:  Soft, nontender,   Extremities: no peripheral edema.  Neurologic: +tremor  Skin: No lesions        Lab results: Basic Metabolic Panel: Recent Labs  Lab 09/03/17 1754 09/04/17 0303  NA 137 137  K 3.3* 3.7  CL 102 102  CO2 24 23  GLUCOSE 92 116*  BUN 38* 49*  CREATININE 2.07* 2.57*  CALCIUM 8.8* 8.5*    Liver Function Tests: No results for input(s): AST, ALT, ALKPHOS, BILITOT, PROT, ALBUMIN in the last 168 hours. No results for input(s): LIPASE, AMYLASE in the last 168 hours. No results for input(s): AMMONIA in the last 168 hours.  CBC: Recent Labs  Lab 09/03/17 1754 09/04/17 0303  WBC 26.0* 18.0*  HGB 14.5 13.6  HCT 43.8 41.1  MCV 92.1 91.8  PLT 205 206    Cardiac Enzymes: No  results for input(s): CKTOTAL, CKMB, CKMBINDEX, TROPONINI in the last 168 hours.  BNP: Invalid input(s): POCBNP  CBG: No results for input(s): GLUCAP in the last 168 hours.  Microbiology: No results found for this or any previous visit.  Coagulation Studies: Recent Labs    09/04/17 0303  LABPROT 31.3*  INR 3.05    Urinalysis: Recent Labs    09/03/17 2124  COLORURINE BROWN*  LABSPEC 1.017  PHURINE TEST NOT REPORTED DUE TO COLOR INTERFERENCE OF URINE PIGMENT  GLUCOSEU TEST NOT REPORTED DUE TO COLOR INTERFERENCE OF URINE PIGMENT*  HGBUR TEST NOT REPORTED DUE TO COLOR INTERFERENCE OF URINE PIGMENT*  BILIRUBINUR TEST NOT REPORTED DUE TO COLOR INTERFERENCE OF URINE PIGMENT*  KETONESUR TEST NOT REPORTED DUE TO COLOR INTERFERENCE OF URINE PIGMENT*  PROTEINUR TEST NOT REPORTED DUE TO COLOR INTERFERENCE OF URINE PIGMENT*  NITRITE TEST NOT REPORTED DUE TO COLOR INTERFERENCE OF URINE PIGMENT*  LEUKOCYTESUR TEST NOT REPORTED DUE TO COLOR INTERFERENCE OF URINE PIGMENT*      Imaging: Ct Head Wo Contrast  Result Date: 09/03/2017 CLINICAL DATA:  Advanced Parkinson's disease. Fall. Low back pain and neck pain. EXAM: CT HEAD WITHOUT CONTRAST CT CERVICAL SPINE WITHOUT CONTRAST TECHNIQUE: Multidetector CT imaging of the head and cervical spine was performed following the standard protocol without intravenous contrast. Multiplanar CT image reconstructions of the cervical spine were also generated. COMPARISON:  None. FINDINGS: CT HEAD FINDINGS Brain: No acute intracranial hemorrhage. No focal mass lesion. No CT evidence of acute infarction. No midline shift or mass effect. No hydrocephalus. Basilar cisterns are patent. Vascular: No hyperdense vessel or unexpected calcification. Skull: Normal. Negative for fracture or focal lesion. Sinuses/Orbits: Paranasal sinuses and mastoid air cells are clear. Orbits are clear. Other: None. CT CERVICAL SPINE FINDINGS Alignment: Reversal normal cervical lordosis.   No subluxation. Skull base and vertebrae: Normal craniocervical junction. No loss of bowel vertebral body height or disc height.  Normal facet articulation. No evidence of fracture. Soft tissues and spinal canal: No prevertebral soft tissue swelling. No perispinal or epidural hematoma. Disc levels:  Unremarkable Upper chest: Clear Other: None IMPRESSION: 1. No intracranial trauma. 2. No cervical spine fracture. 3. Straightening of the normal cervical lordosis may be secondary to position, muscle spasm, or ligamentous injury. Electronically Signed   By: Suzy Bouchard M.D.   On: 09/03/2017 16:30   Ct Cervical Spine Wo Contrast  Result Date: 09/03/2017 CLINICAL DATA:  Advanced Parkinson's disease. Fall. Low back pain and neck pain. EXAM: CT HEAD WITHOUT CONTRAST CT CERVICAL SPINE WITHOUT CONTRAST TECHNIQUE: Multidetector CT imaging of the head and cervical spine was performed following the standard protocol without intravenous contrast. Multiplanar CT image reconstructions of the cervical spine were also generated. COMPARISON:  None. FINDINGS: CT HEAD FINDINGS Brain: No acute intracranial hemorrhage. No focal mass lesion. No CT evidence of acute infarction. No midline shift or mass effect. No hydrocephalus. Basilar cisterns are patent. Vascular: No hyperdense vessel or unexpected calcification. Skull: Normal. Negative for fracture or focal lesion. Sinuses/Orbits: Paranasal sinuses and mastoid air cells are clear. Orbits are clear. Other: None. CT CERVICAL SPINE FINDINGS Alignment: Reversal normal cervical lordosis.  No subluxation. Skull base and vertebrae: Normal craniocervical junction. No loss of bowel vertebral body height or disc height. Normal facet articulation. No evidence of fracture. Soft tissues and spinal canal: No prevertebral soft tissue swelling. No perispinal or epidural hematoma. Disc levels:  Unremarkable Upper chest: Clear Other: None IMPRESSION: 1. No intracranial trauma. 2. No cervical spine  fracture. 3. Straightening of the normal cervical lordosis may be secondary to position, muscle spasm, or ligamentous injury. Electronically Signed   By: Suzy Bouchard M.D.   On: 09/03/2017 16:30   Ct Lumbar Spine Wo Contrast  Result Date: 09/03/2017 CLINICAL DATA:  66 y/o  M; status post fall with lower back pain. EXAM: CT LUMBAR SPINE WITHOUT CONTRAST TECHNIQUE: Multidetector CT imaging of the lumbar spine was performed without intravenous contrast administration. Multiplanar CT image reconstructions were also generated. COMPARISON:  None. FINDINGS: Segmentation: Rudimentary L1 vertebral body ribs. Last complete intervertebral disc labeled as L5-S1. Alignment: Mild lumbar levocurvature with apex at L4. Straightening of lumbar lordosis without listhesis. Vertebrae: No acute fracture or focal pathologic process. Paraspinal and other soft tissues: Calcific aortic atherosclerosis. Disc levels: Lumbar spondylosis with prominent discogenic degenerative changes at L4-5 and L5-S1 levels. History facet disease contributes to mild canal stenosis at L4-5 and L5-S1. Disc bulges and endplate marginal osteophytes combined with ligamentum flavum and facet hypertrophy result in foraminal stenosis that is mild at the left L2-3 through L5-S1 neural foramen and on the right at L3-4 and L5-S1. The right L4-5 neural foramen is moderately stenosed. IMPRESSION: 1. No acute fracture or dislocation identified. 2. Mild lumbar levocurvature and spondylosis greatest at L4-5 and L5-S1 levels. 3. Multifactorial mild L4-5 and L5-S1 canal stenosis. 4. Multilevel mild foraminal stenosis with moderate right L4-5 foraminal stenosis. 5. Abdominal mild aortic calcific atherosclerosis. Electronically Signed   By: Kristine Garbe M.D.   On: 09/03/2017 16:31   US Renal  Result Date: 09/04/2017 CLINICAL DATA:  Renal injury after fall.  Elevated serum creatinine. EXAM: RENAL / URINARY TRACT ULTRASOUND COMPLETE COMPARISON:  None.  FINDINGS: Right Kidney: Length: 11.6 cm. Echogenicity and renal cortical thickness are within normal limits. No mass, perinephric fluid, or hydronephrosis visualized. No sonographically demonstrable calculus or ureterectasis. Left Kidney: Length: 11.4 cm. Echogenicity and renal cortical thickness are within normal  limits. No mass, perinephric fluid, or hydronephrosis visualized. No sonographically demonstrable calculus or ureterectasis. Bladder: Appears normal for degree of bladder distention. Prostate measures 3.2 x 2.3 x 4.4 cm with a mildly inhomogeneous appearance. IMPRESSION: Mildly inhomogeneous appearance the prostate. This finding may warrant PSA correlation and direct clinical examination. Study otherwise unremarkable. Electronically Signed   By: Lowella Grip III M.D.   On: 09/04/2017 08:31   Dg Chest Portable 1 View  Result Date: 09/03/2017 CLINICAL DATA:  Congestion EXAM: PORTABLE CHEST 1 VIEW COMPARISON:  05/11/2007 FINDINGS: No consolidation or effusion.  Mild cardiomegaly.  No pneumothorax. IMPRESSION: Mild cardiomegaly. Negative for edema or focal pulmonary infiltrate. Electronically Signed   By: Donavan Foil M.D.   On: 09/03/2017 20:13      Assessment & Plan: Mr. Douglas Silva. is a 66 y.o. white male with Parkinson's, hypertension, hypothyroidism, hyperlipidemia, atrial fibrillation on warfarin who was admitted to [facility] on 09/03/2017 for Acute renal insufficiency [N28.9] AKI (acute kidney injury) ([place]) [N17.9] Fall, initial encounter [W19.XXXA]  1. Acute Renal Failure: baseline creatinine of 0.87.  Urine is dark and consistent with ATN. Could be secondary to ibuprofen.  Urinalysis without obstruction but with large prostate.  Hypokalemia is consistent with prerenal azotemia from either poor PO intake or from GI/renal losses. Currently not on a diuretic.  - Continue IV fluids for now.   2. Enlarged prostate with urinary incontinence - start on finesteride.   3.  Hypertension: blood pressure low. On metoprolol.   LOS: [place], [place] 11/11/20181:54 PM

## 2017-09-04 NOTE — Progress Notes (Signed)
Patient alert and oriented. Tremors of upper extremities per baseline. Denies pain. Med rec revised by pharmacy tech to add parkinson's meds.   Deri Fuelling, RN

## 2017-09-04 NOTE — Evaluation (Signed)
Physical Therapy Evaluation Patient Details Name: Douglas Silva. MRN: [id number] DOB: December 14, 1950 Today's Date: 09/04/2017   History of Present Illness  Pt is a 66 y.o. male presenting to hospital s/p fall with back pain (pt walking with walker and lost footing d/t chronic movement issues with Parkinson's disease).  PMH includes a-fib, Parkinson's disease, htn, CAD.  Clinical Impression  Prior to hospital admission, pt was modified independent ambulating with walker (pt reports 1 fall every 2-4 weeks).  Pt lives alone in 1 level home with steps to enter.  Currently pt is mod to max assist supine to sit, mod to max assist to stand with RW (d/t significant posterior lean), and mod to max assist to walk a few feet bed to recliner with RW (pt requiring assist to shift weight laterally B; pt able to advance R LE on own but required intermittent assist of therapist to advance L LE; also assist to shift pt's weight forward d/t posterior lean).  Pt would benefit from skilled PT to address noted impairments and functional limitations (see below for any additional details).  Upon hospital discharge, recommend pt discharge to [place].    Follow Up Recommendations SNF    Equipment Recommendations  Rolling walker with 5" wheels    Recommendations for Other Services OT consult     Precautions / Restrictions Precautions Precautions: Fall Precaution Comments: Aspiration Restrictions Weight Bearing Restrictions: No      Mobility  Bed Mobility Overal bed mobility: Needs Assistance Bed Mobility: Supine to Sit     Supine to sit: Mod assist;Max assist;HOB elevated     General bed mobility comments: assist for trunk and B LE's supine to sit; increased effort and time for pt to perform activity and required assist to scoot to edge of bed (pt with difficulty initiating movement with UE's to push himself forward)  Transfers Overall transfer level: Needs assistance Equipment used: Rolling walker (2  wheeled) Transfers: Sit to/from Stand Sit to Stand: Mod assist;Max assist         General transfer comment: x2 trials; pt with posterior lean both trials (pt given vc's and tactile cues and repositioning to attempt to correct) but demonstrating fairly good strength pushing through LE's; assist to shift pt's weight/BOS forward d/t posterior lean standing up  Ambulation/Gait Ambulation/Gait assistance: Mod assist;Max assist Ambulation Distance (Feet): 3 Feet(bed to recliner) Assistive device: Rolling walker (2 wheeled)   Gait velocity: decreased   General Gait Details: pt requiring assist to shift weight laterally B; pt able to advance R LE on own but required intermittent assist of therapist to advance L LE; assist to shift pt's weight forward d/t posterior lean  Stairs            Wheelchair Mobility    Modified Rankin (Stroke Patients Only)       Balance Overall balance assessment: Needs assistance;History of Falls Sitting-balance support: Bilateral upper extremity supported;Feet supported Sitting balance-Leahy Scale: Poor Sitting balance - Comments: intermittent assist d/t posterior lean   Standing balance support: Bilateral upper extremity supported Standing balance-Leahy Scale: Zero Standing balance comment: requires min to max assist to shift weight forward d/t posterior lean                             Pertinent Vitals/Pain Pain Assessment: 0-10 Pain Score: 3  Pain Location: low back pain Pain Descriptors / Indicators: Sore Pain Intervention(s): Limited activity within patient's tolerance;Monitored during session;Repositioned  Vitals (  HR and O2 on room air) stable and WFL throughout treatment session.    Home Living Family/patient expects to be discharged to:: Private residence Living Arrangements: Alone Available Help at Discharge: Available PRN/intermittently;Neighbor;Family   Home Access: Stairs to enter   Entrance Stairs-Number of Steps:  3 STE no railing Home Layout: One level Home Equipment: Grab bars - toilet;Grab bars - tub/shower;Walker - 2 wheels      Prior Function Level of Independence: Independent with assistive device(s)         Comments: Pt reports using RW for ambulation.  Also reports 1 fall every 2-4 weeks.     Hand Dominance        Extremity/Trunk Assessment   Upper Extremity Assessment Upper Extremity Assessment: Generalized weakness    Lower Extremity Assessment Lower Extremity Assessment: Generalized weakness    Cervical / Trunk Assessment Cervical / Trunk Assessment: (Forward head position)  Communication   Communication: No difficulties  Cognition Arousal/Alertness: Awake/alert Behavior During Therapy: Flat affect Overall Cognitive Status: Within Functional Limits for tasks assessed                                        General Comments General comments (skin integrity, edema, etc.): Pt resting in bed upon PT entry.  Bruising and abrasion noted L knee (pt denying any pain and reports are from falls).  Nursing cleared pt for participation in physical therapy.  Pt agreeable to PT session.    Exercises  Transfer training   Assessment/Plan    PT Assessment Patient needs continued PT services  PT Problem List Decreased strength;Decreased range of motion;Decreased activity tolerance;Decreased balance;Decreased mobility;Decreased coordination;Pain       PT Treatment Interventions DME instruction;Gait training;Stair training;Functional mobility training;Therapeutic activities;Therapeutic exercise;Balance training;Patient/family education    PT Goals (Current goals can be found in the Care Plan section)  Acute Rehab PT Goals Patient Stated Goal: to be able to walk again PT Goal Formulation: With patient Time For Goal Achievement: 09/18/17 Potential to Achieve Goals: Good    Frequency Min 2X/week   Barriers to discharge Decreased caregiver support       Co-evaluation               AM-PAC PT "6 Clicks" Daily Activity  Outcome Measure Difficulty turning over in bed (including adjusting bedclothes, sheets and blankets)?: Unable Difficulty moving from lying on back to sitting on the side of the bed? : Unable Difficulty sitting down on and standing up from a chair with arms (e.g., wheelchair, bedside commode, etc,.)?: Unable Help needed moving to and from a bed to chair (including a wheelchair)?: A Lot Help needed walking in hospital room?: Total Help needed climbing 3-5 steps with a railing? : Total 6 Click Score: 7    End of Session Equipment Utilized During Treatment: Gait belt Activity Tolerance: Patient tolerated treatment well Patient left: in chair;with call bell/phone within reach;with chair alarm set Nurse Communication: Mobility status;Precautions PT Visit Diagnosis: Other abnormalities of gait and mobility (R26.89);Repeated falls (R29.6);Muscle weakness (generalized) (M62.81)    Time: 1420-1450 PT Time Calculation (min) (ACUTE ONLY): 30 min   Charges:   PT Evaluation $PT Eval Low Complexity: 1 Low PT Treatments $Therapeutic Activity: 8-22 mins   PT G Codes:   PT G-Codes **NOT FOR INPATIENT CLASS** Functional Assessment Tool Used: AM-PAC 6 Clicks Basic Mobility Functional Limitation: Mobility: Walking and moving around Mobility: Walking and  Moving Around Current Status [phone]): At least 80 percent but less than 100 percent impaired, limited or restricted Mobility: Walking and Moving Around Goal Status [phone]): At least 1 percent but less than 20 percent impaired, limited or restricted    Leitha Bleak, PT 09/04/17, 3:05 PM [phone]

## 2017-09-04 NOTE — Progress Notes (Signed)
New Admission Note:   Arrival Method: per stretcher from ED, pt came from home Mental Orientation: alert and oriented X4 Telemetry: none ordered Assessment: Completed Skin: warm, dry, with bruise and abrasion noted on the left knee sustained from a fall at home. Prophylactic sacral foam applied IV: G20 on the right AC with transparent dressing, intact Pain: denies any pain as of this time Safety Measures: Safety Fall Prevention Plan has been given and discussed Admission: Completed 1A Orientation: Patient has been oriented to the room, unit and staff.  Family: no family member present at this time  Orders have been reviewed and implemented. Call light has been placed within reach, fall risk armband applied, yellow socks on, bed alarm activated. Will continue to monitor patient.  Georgeanna Harrison, RN [facility] 1A

## 2017-09-04 NOTE — Clinical Social Work Note (Signed)
CSW received consult for possible STR. CSW will follow pending PT recommendations.   Santiago Bumpers, MSW, Latanya Presser [phone]

## 2017-09-04 NOTE — Progress Notes (Signed)
Patient ID: Douglas Corrente., male   DOB: 1951-03-26, 66 y.o.   MRN: [id number]  [facility] PROGRESS NOTE  Douglas Corrente. OZD:[id number] DOB: 09/02/1951 DOA: 09/03/2017 PCP: [name], MD  HPI/Subjective: Patient states he feels better today.  He had a few episodes of incontinence.  Bladder scan this morning only showed 200 cc.  Yesterday the patient had some pain in his kidney.  He had a fall at home and states he was bruised up.  The patient's aunt that stated he had a fall couple weeks back and bruised his knee.  Patient states that he was able to get his urine out without a problem.  Objective: Vitals:   09/04/17 0730 09/04/17 1333  BP: 124/75 100/72  Pulse: 99 87  Resp: 18 18  Temp: 98.5 F (36.9 C) 98.5 F (36.9 C)  SpO2: 100% 99%    Filed Weights   09/04/17 0054 09/04/17 0500  Weight: 73.8 kg (162 lb 12.8 oz) 73.8 kg (162 lb 12.8 oz)    ROS: Review of Systems  Constitutional: Negative for chills and fever.  Eyes: Negative for blurred vision.  Respiratory: Negative for cough and shortness of breath.   Cardiovascular: Negative for chest pain.  Gastrointestinal: Negative for abdominal pain, constipation, diarrhea, nausea and vomiting.  Genitourinary: Negative for dysuria.  Musculoskeletal: Positive for back pain. Negative for joint pain.  Neurological: Positive for tremors. Negative for dizziness and headaches.   Exam: Physical Exam  HENT:  Nose: No mucosal edema.  Mouth/Throat: No oropharyngeal exudate or posterior oropharyngeal edema.  Eyes: Conjunctivae, EOM and lids are normal. Pupils are equal, round, and reactive to light.  Neck: No JVD present. Carotid bruit is not present. No edema present. No thyroid mass and no thyromegaly present.  Cardiovascular: S1 normal and S2 normal. Exam reveals no gallop.  No murmur heard. Pulses:      Dorsalis pedis pulses are 2+ on the right side, and 2+ on the left side.  Respiratory: No respiratory  distress. He has no wheezes. He has no rhonchi. He has no rales.  GI: Soft. Bowel sounds are normal. There is no tenderness.  Musculoskeletal:       Right shoulder: He exhibits no swelling.  Lymphadenopathy:    He has no cervical adenopathy.  Neurological: He is alert. No cranial nerve deficit.  Skin: Skin is warm. No rash noted. Nails show no clubbing.  Bruising left knee from previous fall  Psychiatric: He has a normal mood and affect.      Data Reviewed: Basic Metabolic Panel: Recent Labs  Lab 09/03/17 1754 09/04/17 0303  NA 137 137  K 3.3* 3.7  CL 102 102  CO2 24 23  GLUCOSE 92 116*  BUN 38* 49*  CREATININE 2.07* 2.57*  CALCIUM 8.8* 8.5*   CBC: Recent Labs  Lab 09/03/17 1754 09/04/17 0303  WBC 26.0* 18.0*  HGB 14.5 13.6  HCT 43.8 41.1  MCV 92.1 91.8  PLT 205 206     Studies: Ct Head Wo Contrast  Result Date: 09/03/2017 CLINICAL DATA:  Advanced Parkinson's disease. Fall. Low back pain and neck pain. EXAM: CT HEAD WITHOUT CONTRAST CT CERVICAL SPINE WITHOUT CONTRAST TECHNIQUE: Multidetector CT imaging of the head and cervical spine was performed following the standard protocol without intravenous contrast. Multiplanar CT image reconstructions of the cervical spine were also generated. COMPARISON:  None. FINDINGS: CT HEAD FINDINGS Brain: No acute intracranial hemorrhage. No focal mass lesion. No CT evidence of acute  infarction. No midline shift or mass effect. No hydrocephalus. Basilar cisterns are patent. Vascular: No hyperdense vessel or unexpected calcification. Skull: Normal. Negative for fracture or focal lesion. Sinuses/Orbits: Paranasal sinuses and mastoid air cells are clear. Orbits are clear. Other: None. CT CERVICAL SPINE FINDINGS Alignment: Reversal normal cervical lordosis.  No subluxation. Skull base and vertebrae: Normal craniocervical junction. No loss of bowel vertebral body height or disc height. Normal facet articulation. No evidence of fracture. Soft  tissues and spinal canal: No prevertebral soft tissue swelling. No perispinal or epidural hematoma. Disc levels:  Unremarkable Upper chest: Clear Other: None IMPRESSION: 1. No intracranial trauma. 2. No cervical spine fracture. 3. Straightening of the normal cervical lordosis may be secondary to position, muscle spasm, or ligamentous injury. Electronically Signed   By: Suzy Bouchard M.D.   On: 09/03/2017 16:30   Ct Cervical Spine Wo Contrast  Result Date: 09/03/2017 CLINICAL DATA:  Advanced Parkinson's disease. Fall. Low back pain and neck pain. EXAM: CT HEAD WITHOUT CONTRAST CT CERVICAL SPINE WITHOUT CONTRAST TECHNIQUE: Multidetector CT imaging of the head and cervical spine was performed following the standard protocol without intravenous contrast. Multiplanar CT image reconstructions of the cervical spine were also generated. COMPARISON:  None. FINDINGS: CT HEAD FINDINGS Brain: No acute intracranial hemorrhage. No focal mass lesion. No CT evidence of acute infarction. No midline shift or mass effect. No hydrocephalus. Basilar cisterns are patent. Vascular: No hyperdense vessel or unexpected calcification. Skull: Normal. Negative for fracture or focal lesion. Sinuses/Orbits: Paranasal sinuses and mastoid air cells are clear. Orbits are clear. Other: None. CT CERVICAL SPINE FINDINGS Alignment: Reversal normal cervical lordosis.  No subluxation. Skull base and vertebrae: Normal craniocervical junction. No loss of bowel vertebral body height or disc height. Normal facet articulation. No evidence of fracture. Soft tissues and spinal canal: No prevertebral soft tissue swelling. No perispinal or epidural hematoma. Disc levels:  Unremarkable Upper chest: Clear Other: None IMPRESSION: 1. No intracranial trauma. 2. No cervical spine fracture. 3. Straightening of the normal cervical lordosis may be secondary to position, muscle spasm, or ligamentous injury. Electronically Signed   By: Suzy Bouchard M.D.   On:  09/03/2017 16:30   Ct Lumbar Spine Wo Contrast  Result Date: 09/03/2017 CLINICAL DATA:  66 y/o  M; status post fall with lower back pain. EXAM: CT LUMBAR SPINE WITHOUT CONTRAST TECHNIQUE: Multidetector CT imaging of the lumbar spine was performed without intravenous contrast administration. Multiplanar CT image reconstructions were also generated. COMPARISON:  None. FINDINGS: Segmentation: Rudimentary L1 vertebral body ribs. Last complete intervertebral disc labeled as L5-S1. Alignment: Mild lumbar levocurvature with apex at L4. Straightening of lumbar lordosis without listhesis. Vertebrae: No acute fracture or focal pathologic process. Paraspinal and other soft tissues: Calcific aortic atherosclerosis. Disc levels: Lumbar spondylosis with prominent discogenic degenerative changes at L4-5 and L5-S1 levels. History facet disease contributes to mild canal stenosis at L4-5 and L5-S1. Disc bulges and endplate marginal osteophytes combined with ligamentum flavum and facet hypertrophy result in foraminal stenosis that is mild at the left L2-3 through L5-S1 neural foramen and on the right at L3-4 and L5-S1. The right L4-5 neural foramen is moderately stenosed. IMPRESSION: 1. No acute fracture or dislocation identified. 2. Mild lumbar levocurvature and spondylosis greatest at L4-5 and L5-S1 levels. 3. Multifactorial mild L4-5 and L5-S1 canal stenosis. 4. Multilevel mild foraminal stenosis with moderate right L4-5 foraminal stenosis. 5. Abdominal mild aortic calcific atherosclerosis. Electronically Signed   By: Kristine Garbe M.D.   On: 09/03/2017  16:31   US Renal  Result Date: 09/04/2017 CLINICAL DATA:  Renal injury after fall.  Elevated serum creatinine. EXAM: RENAL / URINARY TRACT ULTRASOUND COMPLETE COMPARISON:  None. FINDINGS: Right Kidney: Length: 11.6 cm. Echogenicity and renal cortical thickness are within normal limits. No mass, perinephric fluid, or hydronephrosis visualized. No sonographically  demonstrable calculus or ureterectasis. Left Kidney: Length: 11.4 cm. Echogenicity and renal cortical thickness are within normal limits. No mass, perinephric fluid, or hydronephrosis visualized. No sonographically demonstrable calculus or ureterectasis. Bladder: Appears normal for degree of bladder distention. Prostate measures 3.2 x 2.3 x 4.4 cm with a mildly inhomogeneous appearance. IMPRESSION: Mildly inhomogeneous appearance the prostate. This finding may warrant PSA correlation and direct clinical examination. Study otherwise unremarkable. Electronically Signed   By: Lowella Grip III M.D.   On: 09/04/2017 08:31   Dg Chest Portable 1 View  Result Date: 09/03/2017 CLINICAL DATA:  Congestion EXAM: PORTABLE CHEST 1 VIEW COMPARISON:  05/11/2007 FINDINGS: No consolidation or effusion.  Mild cardiomegaly.  No pneumothorax. IMPRESSION: Mild cardiomegaly. Negative for edema or focal pulmonary infiltrate. Electronically Signed   By: Donavan Foil M.D.   On: 09/03/2017 20:13    Scheduled Meds: . finasteride  5 mg Oral Daily  . levothyroxine  100 mcg Oral QAC breakfast  . metoprolol succinate  25 mg Oral Daily  . multivitamin with minerals  1 tablet Oral Daily  . [START ON 09/05/2017] pneumococcal 23 valent vaccine  0.5 mL Intramuscular Tomorrow-1000  . rOPINIRole  0.5 mg Oral TID WC & HS  . simvastatin  20 mg Oral Daily  . sodium chloride flush  3 mL Intravenous Q12H   Continuous Infusions: . sodium chloride 75 mL/hr at 09/04/17 0912    Assessment/Plan:  1. Acute kidney injury.  Could be with the dehydration and relative hypotension.  Case discussed with nephrology.  Bladder scan does not show urinary retention.  Patient does have an enlarged prostate but denies urinary symptoms.  Continue IV fluid hydration and check creatinine tomorrow.  Renal ultrasound did comment on some bladder distention but appeared normal. 2. Relative hypotension.  Continue IV fluids. 3. BPH.  Start finasteride.   With Parkinson's Flomax is likely not a good medication for this patient. 4. Hypothyroidism unspecified on levothyroxine.  TSH normal range. 5. Atrial fibrillation history rate control with metoprolol on Coumadin and pharmacy is dosing. 6. Full physical therapy evaluation 7. Parkinson's disease on Requip 8. Hyperlipidemia unspecified on simvastatin.  Add on a CPK  Code Status:     Code Status Orders  (From admission, onward)        Start     Ordered   09/04/17 0042  Full code  Continuous     09/04/17 0041    Code Status History    Date Active Date Inactive Code Status Order ID Comments User Context   06/01/2017 20:14 06/03/2017 15:06 Full Code [id number]  Loletha Grayer, MD ED     Family Communication: Patient's aunt at the bedside Disposition Plan: To be determined  Consultants:  Nephrology  Time spent: 28 minutes  Loletha Grayer  [employer]

## 2017-09-04 NOTE — ED Notes (Signed)
Pt swallowed Potassium tablet without difficulty

## 2017-09-04 NOTE — ED Notes (Signed)
Pt's soiled clothing knotted in a belonging bag and placed in a clean belonging bag; pt's wallet and keys placed in clear zip-locked bag and placed inside that belonging bag;

## 2017-09-04 NOTE — Clinical Social Work Note (Signed)
Clinical Social Work Assessment  Patient Details  Name: Douglas Silva. MRN: [id number] Date of Birth: [birth year]  Date of referral:  09/04/17               Reason for consult:  Facility Placement                Permission sought to share information with:  Facility Art therapist granted to share information::  Yes, Verbal Permission Granted  Name::        Agency::     Relationship::     Contact Information:     Housing/Transportation Living arrangements for the past 2 months:  Single Family Home Source of Information:  Patient, Medical Team, Friend/Neighbor Patient Interpreter Needed:  None Criminal Activity/Legal Involvement Pertinent to Current Situation/Hospitalization:  No - Comment as needed Significant Relationships:  Warehouse manager, Other Family Members Lives with:  Self Do you feel safe going back to the place where you live?  Yes Need for family participation in patient care:  No (Coment)  Care giving concerns:  PT recommendation for STR   Social Worker assessment / plan:  CSW met with the patient at bedside to discuss discharge planning. The patient gave verbal permission to begin the STR referral process; however, he is guarded about discharge to SNF and would prefer home. The CSW discussed safety concerns with return home as the patient lacks appropriate support at this time. The patient indicated that he would be willing to consider discharge to SNF and stated that [facility] and [facility] were not facilities he would consider.  The patient has Parkinson's disease, and according to a visiting friend, the disease progress has been swift in the past 6 months. The patient lives alone and has limited family support due to their own medical needs. Currently, the patient seems to be in denial about his abilities as, according to his friend, the patient refuses to use DME which has resulted in two major falls in the past 60 days. The  patient is not willing to discuss long term goals at this time; however, his friends and family are aware that he may need LTC SNF or ALF after STR.   CSW will provide bed offers as the are available and follow to facilitate discharge.   Employment status:  Retired Forensic scientist:  [employer] PT Recommendations:  [place] / Referral to community resources:  [place]  Patient/Family's Response to care:  The patient was guarded but thanked the CSW for assistance. Patient/Family's Understanding of and Emotional Response to Diagnosis, Current Treatment, and Prognosis:  The patient has some insight into his condition and seems to be grieving about his medical changes. The patient is willing to consider STR; however, he may change his mind.   Emotional Assessment Appearance:  Appears older than stated age Attitude/Demeanor/Rapport:  Avoidant, Guarded Affect (typically observed):  Frustrated, Guarded Orientation:  Oriented to Self, Oriented to Place, Oriented to  Time, Oriented to Situation Alcohol / Substance use:  Never Used Psych involvement (Current and /or in the community):  No (Comment)  Discharge Needs  Concerns to be addressed:  Adjustment to Illness, Care Coordination, Discharge Planning Concerns Readmission within the last 30 days:  No Current discharge risk:  Chronically ill, Physical Impairment, Cognitively Impaired Barriers to Discharge:  Continued Medical Work up   [employer], LCSW 09/04/2017, 4:58 PM

## 2017-09-04 NOTE — Care Management Note (Addendum)
Case Management Note  Patient Details  Name: Douglas Silva. MRN: [id number] Date of Birth: 09/25/1951  Subjective/Objective:     Discussed discharge planning with [name] who was admitted after a fall at home. Severe Parkinson's disease and lives alone. PCP=Dr Brynda Greathouse (states that he read in the "classifieds that Dr Brynda Greathouse is retiring). Pharmacy= [facility] (was unable to specify which one). [name] seemed a little confused in his responses to this Probation officer. [name] is recommending SNF. [name] talked about interviewing an "aide" to assist him at home but became vague about specifics when asked for details. [name] has no home oxygen. He has no home health services at this time. His only home assistive device is a RW. [name] was vague about transportation. Case management will follow for discharge planning if [name] declines offer of SNF.            Action/Plan:   Expected Discharge Date:                  Expected Discharge Plan:     In-House Referral:     Discharge planning Services     Post Acute Care Choice:    Choice offered to:     DME Arranged:    DME Agency:     HH Arranged:    HH Agency:     Status of Service:     If discussed at [employer] of Stay Meetings, dates discussed:    Additional Comments:  [name] A, RN 09/04/2017, 4:12 PM

## 2017-09-05 ENCOUNTER — Encounter
Admission: EM | Disposition: A | Discharge status: Skilled Nursing Facility | Payer: Self-pay | Source: Home / Self Care | Attending: Internal Medicine

## 2017-09-05 DIAGNOSIS — N189 Chronic kidney disease, unspecified: Secondary | ICD-10-CM

## 2017-09-05 DIAGNOSIS — I4891 Unspecified atrial fibrillation: Secondary | ICD-10-CM

## 2017-09-05 DIAGNOSIS — E8770 Fluid overload, unspecified: Secondary | ICD-10-CM

## 2017-09-05 DIAGNOSIS — I10 Essential (primary) hypertension: Secondary | ICD-10-CM

## 2017-09-05 HISTORY — PX: DIALYSIS/PERMA CATHETER INSERTION: CATH118288

## 2017-09-05 LAB — BASIC METABOLIC PANEL
Anion gap: 8 (ref 5–15)
BUN: 73 mg/dL — ABNORMAL HIGH (ref 6–20)
CALCIUM: 8.4 mg/dL — AB (ref 8.9–10.3)
CO2: 24 mmol/L (ref 22–32)
CREATININE: 4.94 mg/dL — AB (ref 0.61–1.24)
Chloride: 101 mmol/L (ref 101–111)
GFR calc non Af Amer: 11 mL/min — ABNORMAL LOW (ref >60)
GFR, EST AFRICAN AMERICAN: 13 mL/min — AB (ref >60)
Glucose, Bld: 107 mg/dL — ABNORMAL HIGH (ref 65–99)
Potassium: 4.1 mmol/L (ref 3.5–5.1)
SODIUM: 133 mmol/L — AB (ref 135–145)

## 2017-09-05 LAB — URINALYSIS, COMPLETE (UACMP) WITH MICROSCOPIC
Bilirubin Urine: NEGATIVE
Glucose, UA: 50 mg/dL — AB
Ketones, ur: NEGATIVE mg/dL
Leukocytes, UA: NEGATIVE
Nitrite: NEGATIVE
Protein, ur: >=300 mg/dL — AB
SPECIFIC GRAVITY, URINE: 1.01 (ref 1.005–1.030)
pH: 6 (ref 5.0–8.0)

## 2017-09-05 LAB — CBC
HCT: 39.3 % — ABNORMAL LOW (ref 40.0–52.0)
HEMOGLOBIN: 13.1 g/dL (ref 13.0–18.0)
MCH: 30.6 pg (ref 26.0–34.0)
MCHC: 33.3 g/dL (ref 32.0–36.0)
MCV: 92 fL (ref 80.0–100.0)
Platelets: 173 10*3/uL (ref 150–440)
RBC: 4.27 MIL/uL — AB (ref 4.40–5.90)
RDW: 13.7 % (ref 11.5–14.5)
WBC: 12.5 10*3/uL — AB (ref 3.8–10.6)

## 2017-09-05 LAB — PROTIME-INR
INR: 2.68
PROTHROMBIN TIME: 28.3 s — AB (ref 11.4–15.2)

## 2017-09-05 SURGERY — DIALYSIS/PERMA CATHETER INSERTION
Anesthesia: LOCAL

## 2017-09-05 MED ORDER — WARFARIN SODIUM 5 MG PO TABS
5.0000 mg | ORAL_TABLET | Freq: Once | ORAL | Status: AC
  Administered 2017-09-05: 5 mg via ORAL
  Filled 2017-09-05: qty 1

## 2017-09-05 MED ORDER — SODIUM CHLORIDE 0.9 % IV SOLN: INTRAVENOUS | Status: DC

## 2017-09-05 MED ORDER — WARFARIN - PHARMACIST DOSING INPATIENT
Freq: Every day | Status: DC
  Administered 2017-09-05 – 2017-09-14 (×8)

## 2017-09-05 SURGICAL SUPPLY — 1 items: KIT DIALYSIS CATH TRI 30X13 (CATHETERS) ×2 IMPLANT

## 2017-09-05 NOTE — Progress Notes (Signed)
Initial Nutrition Assessment  DOCUMENTATION CODES:   Not applicable  INTERVENTION:  Provide Carnation Instant Breakfast in whole milk po TID, each supplement provides 280 kcal, 13 grams of protein.  Continue daily MVI.  NUTRITION DIAGNOSIS:   Inadequate oral intake related to decreased appetite as evidenced by per patient/family report.  GOAL:   Patient will meet greater than or equal to 90% of their needs  MONITOR:   PO intake, Supplement acceptance, Labs, Weight trends, I & O's  REASON FOR ASSESSMENT:   Consult Assessment of nutrition requirement/status  ASSESSMENT:   66 year old male with PMHx of HTN, CAD, A-fib, cardiomyopathy, Parkinson's disease, hypothyroidism who is admitted with acute rhabdomyolysis after a fall, also found to have acute kidney injury, hypotension.   -Patient s/p placement of temporary triple lumen dialysis catheter in right femoral vein today in setting of acute renal failure.  Met with patient at bedside. His twin sister was here earlier when attempted to meet with patient (could not meet as they were on a call) but she has now left. Patient reports he has had a decreased appetite lately, but is unsure how long. He is not sure what is causing his decreased appetite. He reports he typically eats 2 meals per day. For breakfast he eats cereal in whole milk. For dinner he eats chicken with vegetables. Occasionally has snacks. Patient has previously had Ensure or Boost but does not like them because they are too rich. Since he enjoys milk he is amenable to trying CIB. Denies any N/V, abdominal pain, constipation/diarrhea, difficulty chewing/swallowing.  UBW 170-172 lbs per patient report. However, per chart patient has been 155-165 lbs this past year. On admission patient was 73.8 kg. Has now increased to 75.9 kg. Will continue to monitor weight trend with HD.  Meal Completion: 100% per chart  Medications reviewed and include: levothyroxine, MVI daily,  warfarin.  Labs reviewed: Sodium 133, BUN 73 (increased from 49 on 11/11), Creatinine 4.94 (increased from 2.57 on 11/11), eGFR 11 (decreased from 25 on 11/11), CK total 39269. No phosphorus available.  Patient does not meet criteria for malnutrition at this time.  Discussed with RN.  NUTRITION - FOCUSED PHYSICAL EXAM:    Most Recent Value  Orbital Region  No depletion  Upper Arm Region  No depletion  Thoracic and Lumbar Region  No depletion  Buccal Region  No depletion  Temple Region  No depletion  Clavicle Bone Region  No depletion  Clavicle and Acromion Bone Region  No depletion  Scapular Bone Region  No depletion  Dorsal Hand  No depletion  Patellar Region  No depletion  Anterior Thigh Region  No depletion  Posterior Calf Region  No depletion  Edema (RD Assessment)  None  Hair  Reviewed  Eyes  Reviewed  Mouth  Reviewed  Skin  Reviewed  Nails  Reviewed     Diet Order:  Diet Heart Room service appropriate? Yes; Fluid consistency: Thin  EDUCATION NEEDS:   No education needs have been identified at this time  Skin:  Skin Assessment: Reviewed RN Assessment  Last BM:  09/04/2017 - small type 6  Height:   Ht Readings from Last 1 Encounters:  09/04/17 '5\' 11"'  (1.803 m)    Weight:   Wt Readings from Last 1 Encounters:  09/05/17 167 lb 6.4 oz (75.9 kg)    Ideal Body Weight:  78.2 kg  BMI:  Body mass index is 23.35 kg/m.  Estimated Nutritional Needs:   Kcal:  7943-2761 (  MSJ x 1.2-1.4)  Protein:  75-90 grams (1-1.2 grams/kg)  Fluid:  1.9-2.2 L/day (25-30 mL/kg)  Willey Blade, MS, RD, LDN Office: [phone] Pager: [phone] After Hours/Weekend Pager: [phone]

## 2017-09-05 NOTE — Progress Notes (Addendum)
[place], [place] 09/05/17  Subjective:  Patient is doing fair. Serum creatinine has increased to 4.9, BUN has increased to 73 CK level 39,000 Urine output is decreasing No acute shortness of breath No significant leg edema   Objective:  Vital signs in last 24 hours:  Temp:  [96.5 F (35.8 C)-98 F (36.7 C)] 96.5 F (35.8 C) (11/12 0800) Pulse Rate:  [77-92] 77 (11/12 0800) Resp:  [16-18] 16 (11/12 0800) BP: (111-124)/(68-78) 111/68 (11/12 0800) SpO2:  [98 %] 98 % (11/12 0800) Weight:  [75.9 kg (167 lb 6.4 oz)] 75.9 kg (167 lb 6.4 oz) (11/12 0500)  Weight change: 2.087 kg (4 lb 9.6 oz) Filed Weights   09/04/17 0054 09/04/17 0500 09/05/17 0500  Weight: 73.8 kg (162 lb 12.8 oz) 73.8 kg (162 lb 12.8 oz) 75.9 kg (167 lb 6.4 oz)    Intake/Output:    Intake/Output Summary (Last 24 hours) at 09/05/2017 1413 Last data filed at 09/05/2017 1410 Gross per 24 hour  Intake 2715 ml  Output 100 ml  Net 2615 ml     Physical Exam: General:  No acute distress, laying in the bed  HEENT  anicteric, moist oral mucous membranes  Neck  no distended neck veins  Pulm/lungs  coarse breath sounds at bases otherwise clear, normal respiratory effort  CVS/Heart  no rub or gallop  Abdomen:   Soft, nontender  Extremities:  Trace edema  Neurologic:  Alert, oriented, Parkinson's tremors  Skin:  No acute rashes  Access:        Basic Metabolic Panel:  Recent Labs  Lab 09/03/17 1754 09/04/17 0303 09/05/17 0346  NA 137 137 133*  K 3.3* 3.7 4.1  CL 102 102 101  CO2 24 23 24   GLUCOSE 92 116* 107*  BUN 38* 49* 73*  CREATININE 2.07* 2.57* 4.94*  CALCIUM 8.8* 8.5* 8.4*     CBC: Recent Labs  Lab 09/03/17 1754 09/04/17 0303 09/05/17 0346  WBC 26.0* 18.0* 12.5*  HGB 14.5 13.6 13.1  HCT 43.8 41.1 39.3*  MCV 92.1 91.8 92.0  PLT 205 206 173     No results found for: HEPBSAG, HEPBSAB, HEPBIGM    Microbiology:  No results found for this or any  previous visit (from the past 240 hour(s)).  Coagulation Studies: Recent Labs    09/04/17 0303 09/05/17 0346  LABPROT 31.3* 28.3*  INR 3.05 2.68    Urinalysis: Recent Labs    09/03/17 2124  COLORURINE BROWN*  LABSPEC 1.017  PHURINE TEST NOT REPORTED DUE TO COLOR INTERFERENCE OF URINE PIGMENT  GLUCOSEU TEST NOT REPORTED DUE TO COLOR INTERFERENCE OF URINE PIGMENT*  HGBUR TEST NOT REPORTED DUE TO COLOR INTERFERENCE OF URINE PIGMENT*  BILIRUBINUR TEST NOT REPORTED DUE TO COLOR INTERFERENCE OF URINE PIGMENT*  KETONESUR TEST NOT REPORTED DUE TO COLOR INTERFERENCE OF URINE PIGMENT*  PROTEINUR TEST NOT REPORTED DUE TO COLOR INTERFERENCE OF URINE PIGMENT*  NITRITE TEST NOT REPORTED DUE TO COLOR INTERFERENCE OF URINE PIGMENT*  LEUKOCYTESUR TEST NOT REPORTED DUE TO COLOR INTERFERENCE OF URINE PIGMENT*      Imaging: Ct Head Wo Contrast  Result Date: 09/03/2017 CLINICAL DATA:  Advanced Parkinson's disease. Fall. Low back pain and neck pain. EXAM: CT HEAD WITHOUT CONTRAST CT CERVICAL SPINE WITHOUT CONTRAST TECHNIQUE: Multidetector CT imaging of the head and cervical spine was performed following the standard protocol without intravenous contrast. Multiplanar CT image reconstructions of the cervical spine were also generated. COMPARISON:  None. FINDINGS: CT HEAD FINDINGS  Brain: No acute intracranial hemorrhage. No focal mass lesion. No CT evidence of acute infarction. No midline shift or mass effect. No hydrocephalus. Basilar cisterns are patent. Vascular: No hyperdense vessel or unexpected calcification. Skull: Normal. Negative for fracture or focal lesion. Sinuses/Orbits: Paranasal sinuses and mastoid air cells are clear. Orbits are clear. Other: None. CT CERVICAL SPINE FINDINGS Alignment: Reversal normal cervical lordosis.  No subluxation. Skull base and vertebrae: Normal craniocervical junction. No loss of bowel vertebral body height or disc height. Normal facet articulation. No evidence of  fracture. Soft tissues and spinal canal: No prevertebral soft tissue swelling. No perispinal or epidural hematoma. Disc levels:  Unremarkable Upper chest: Clear Other: None IMPRESSION: 1. No intracranial trauma. 2. No cervical spine fracture. 3. Straightening of the normal cervical lordosis may be secondary to position, muscle spasm, or ligamentous injury. Electronically Signed   By: Suzy Bouchard M.D.   On: 09/03/2017 16:30   Ct Cervical Spine Wo Contrast  Result Date: 09/03/2017 CLINICAL DATA:  Advanced Parkinson's disease. Fall. Low back pain and neck pain. EXAM: CT HEAD WITHOUT CONTRAST CT CERVICAL SPINE WITHOUT CONTRAST TECHNIQUE: Multidetector CT imaging of the head and cervical spine was performed following the standard protocol without intravenous contrast. Multiplanar CT image reconstructions of the cervical spine were also generated. COMPARISON:  None. FINDINGS: CT HEAD FINDINGS Brain: No acute intracranial hemorrhage. No focal mass lesion. No CT evidence of acute infarction. No midline shift or mass effect. No hydrocephalus. Basilar cisterns are patent. Vascular: No hyperdense vessel or unexpected calcification. Skull: Normal. Negative for fracture or focal lesion. Sinuses/Orbits: Paranasal sinuses and mastoid air cells are clear. Orbits are clear. Other: None. CT CERVICAL SPINE FINDINGS Alignment: Reversal normal cervical lordosis.  No subluxation. Skull base and vertebrae: Normal craniocervical junction. No loss of bowel vertebral body height or disc height. Normal facet articulation. No evidence of fracture. Soft tissues and spinal canal: No prevertebral soft tissue swelling. No perispinal or epidural hematoma. Disc levels:  Unremarkable Upper chest: Clear Other: None IMPRESSION: 1. No intracranial trauma. 2. No cervical spine fracture. 3. Straightening of the normal cervical lordosis may be secondary to position, muscle spasm, or ligamentous injury. Electronically Signed   By: Suzy Bouchard  M.D.   On: 09/03/2017 16:30   Ct Lumbar Spine Wo Contrast  Result Date: 09/03/2017 CLINICAL DATA:  66 y/o  M; status post fall with lower back pain. EXAM: CT LUMBAR SPINE WITHOUT CONTRAST TECHNIQUE: Multidetector CT imaging of the lumbar spine was performed without intravenous contrast administration. Multiplanar CT image reconstructions were also generated. COMPARISON:  None. FINDINGS: Segmentation: Rudimentary L1 vertebral body ribs. Last complete intervertebral disc labeled as L5-S1. Alignment: Mild lumbar levocurvature with apex at L4. Straightening of lumbar lordosis without listhesis. Vertebrae: No acute fracture or focal pathologic process. Paraspinal and other soft tissues: Calcific aortic atherosclerosis. Disc levels: Lumbar spondylosis with prominent discogenic degenerative changes at L4-5 and L5-S1 levels. History facet disease contributes to mild canal stenosis at L4-5 and L5-S1. Disc bulges and endplate marginal osteophytes combined with ligamentum flavum and facet hypertrophy result in foraminal stenosis that is mild at the left L2-3 through L5-S1 neural foramen and on the right at L3-4 and L5-S1. The right L4-5 neural foramen is moderately stenosed. IMPRESSION: 1. No acute fracture or dislocation identified. 2. Mild lumbar levocurvature and spondylosis greatest at L4-5 and L5-S1 levels. 3. Multifactorial mild L4-5 and L5-S1 canal stenosis. 4. Multilevel mild foraminal stenosis with moderate right L4-5 foraminal stenosis. 5. Abdominal mild aortic calcific  atherosclerosis. Electronically Signed   By: Kristine Garbe M.D.   On: 09/03/2017 16:31   US Renal  Result Date: 09/04/2017 CLINICAL DATA:  Renal injury after fall.  Elevated serum creatinine. EXAM: RENAL / URINARY TRACT ULTRASOUND COMPLETE COMPARISON:  None. FINDINGS: Right Kidney: Length: 11.6 cm. Echogenicity and renal cortical thickness are within normal limits. No mass, perinephric fluid, or hydronephrosis visualized. No  sonographically demonstrable calculus or ureterectasis. Left Kidney: Length: 11.4 cm. Echogenicity and renal cortical thickness are within normal limits. No mass, perinephric fluid, or hydronephrosis visualized. No sonographically demonstrable calculus or ureterectasis. Bladder: Appears normal for degree of bladder distention. Prostate measures 3.2 x 2.3 x 4.4 cm with a mildly inhomogeneous appearance. IMPRESSION: Mildly inhomogeneous appearance the prostate. This finding may warrant PSA correlation and direct clinical examination. Study otherwise unremarkable. Electronically Signed   By: Lowella Grip III M.D.   On: 09/04/2017 08:31   Dg Chest Portable 1 View  Result Date: 09/03/2017 CLINICAL DATA:  Congestion EXAM: PORTABLE CHEST 1 VIEW COMPARISON:  05/11/2007 FINDINGS: No consolidation or effusion.  Mild cardiomegaly.  No pneumothorax. IMPRESSION: Mild cardiomegaly. Negative for edema or focal pulmonary infiltrate. Electronically Signed   By: Donavan Foil M.D.   On: 09/03/2017 20:13     Medications:   . sodium chloride 75 mL/hr (09/04/17 2207)  . sodium chloride     . finasteride  5 mg Oral Daily  . levothyroxine  100 mcg Oral QAC breakfast  . metoprolol succinate  25 mg Oral Daily  . multivitamin with minerals  1 tablet Oral Daily  . pneumococcal 23 valent vaccine  0.5 mL Intramuscular Tomorrow-1000  . rOPINIRole  0.5 mg Oral TID WC & HS  . sodium chloride flush  3 mL Intravenous Q12H  . warfarin  5 mg Oral ONCE-1800  . Warfarin - Pharmacist Dosing Inpatient   Does not apply q1800   acetaminophen **OR** acetaminophen, HYDROcodone-acetaminophen, ondansetron **OR** ondansetron (ZOFRAN) IV, polyethylene glycol  Assessment/ Plan:  66 y.o. Caucasian male With Parkinson's, hypertension, hypothyroidism, hyperlipidemia, atrial fibrillation requiring anticoagulation with warfarin.  He was admitted on 11/10 status post fall and was found to have  acute renal failure and rhabdomyolysis  1.   Acute renal failure.  Baseline creatinine 0.87 2.  Rhabdomyolysis 3.  Hypertension  Acute renal failure is likely secondary to ATN and rhabdomyolysis Due to decreased urine output, worsening BUN and creatinine, no improvement with IV fluids, recommend hemodialysis.  Discussed with patient and his twin sister.  Risks, benefits, alternatives were discussed.  They have decided to proceed.  Hemodialysis orders have been placed; nursing staff alerted. Vascular consult for temporary dialysis catheter placement   LOS: 2 Faven Watterson 11/12/20182:13 PM  [place], [place]

## 2017-09-05 NOTE — Progress Notes (Signed)
Clinical Education officer, museum (CSW) met with patient and presented bed offers. Patient prefers [facility]. Per [facility] admissions coordinator at [facility] they can accept patient and she spoke with patient today. Patient is aware of above. Plan is for patient to D/C to [facility] when stable.   [employer], LCSW [phone]

## 2017-09-05 NOTE — Progress Notes (Signed)
Pt came back from having perm cath placed in R groin, site CDI. Per Dr. Lucky Cowboy, it's ok to give coumadin tonight. Dialysis department stated that pt would go first thing in the morning. Pt resting comfortably in bed, no signs of distress.   [place], Jerry Caras

## 2017-09-05 NOTE — Progress Notes (Signed)
Received a call from lab that pt's BUN and Creatinine levels for today are as follows: BUN=73, Crea=4.94 from yesterdays' 49 and 2.57. Pt has voided once, unmeasurable, pt on diapers. Bladder scan done=63ml. Nephrologist on call Dr. Abigail Butts notified, no orders made. Will continue to monitor.

## 2017-09-05 NOTE — Clinical Social Work Placement (Signed)
   CLINICAL SOCIAL WORK PLACEMENT  NOTE  Date:  09/05/2017  Patient Details  Name: Douglas Silva. MRN: [id number] Date of Birth: 05/01/1951  Clinical Social Work is seeking post-discharge placement for this patient at the [place] level of care (*CSW will initial, date and re-position this form in  chart as items are completed):  Yes   Patient/family provided with [place] Work Department's list of facilities offering this level of care within the geographic area requested by the patient (or if unable, by the patient's family).  Yes   Patient/family informed of their freedom to choose among providers that offer the needed level of care, that participate in Medicare, Medicaid or managed care program needed by the patient, have an available bed and are willing to accept the patient.  Yes   Patient/family informed of [facility]'s ownership interest in [facility] and [facility], as well as of the fact that they are under no obligation to receive care at these facilities.  PASRR submitted to EDS on       PASRR number received on       Existing PASRR number confirmed on 09/04/17     FL2 transmitted to all facilities in geographic area requested by pt/family on 09/04/17     FL2 transmitted to all facilities within larger geographic area on       Patient informed that his/her managed care company has contracts with or will negotiate with certain facilities, including the following:        Yes   Patient/family informed of bed offers received.  Patient chooses bed at [facility] )     Physician recommends and patient chooses bed at      Patient to be transferred to   on  .  Patient to be transferred to facility by       Patient family notified on   of transfer.  Name of family member notified:        PHYSICIAN       Additional Comment:    _______________________________________________ Onica Davidovich, Veronia Beets,  LCSW 09/05/2017, 3:34 PM

## 2017-09-05 NOTE — Consult Note (Signed)
[facility] Vascular Consult Note  MRN : [id number]  Douglas Silva. is a 66 y.o. (Feb 29, 1951) male who presents with chief complaint of  Chief Complaint  Patient presents with  . Fall  .  History of Present Illness: Patient is admitted to the hospital with AKI and hypokalemia and has developed acute renal failure.  The patient reports some swelling and very dark urine with a creatinine which is up over 4 and a BUN which is climbing and over 60..  The patient is critically ill with volume overload situation and worsening renal function. The nephrology service has decided to initiate dialysis at this time, and we are asked to place a temporary dialysis catheter for immediate dialysis use.  I am asked by Dr. Candiss Norse to see the patient for placement of a dialysis catheter  Current Facility-Administered Medications  Medication Dose Route Frequency Provider Last Rate Last Dose  . 0.9 %  sodium chloride infusion   Intravenous Continuous Loletha Grayer, MD 75 mL/hr at 09/04/17 2207 75 mL/hr at 09/04/17 2207  . 0.9 %  sodium chloride infusion   Intravenous Continuous Karma Ansley, Erskine Squibb, MD      . acetaminophen (TYLENOL) tablet 650 mg  650 mg Oral Q6H PRN Salary, Montell D, MD       Or  . acetaminophen (TYLENOL) suppository 650 mg  650 mg Rectal Q6H PRN Salary, Montell D, MD      . finasteride (PROSCAR) tablet 5 mg  5 mg Oral Daily Loletha Grayer, MD   5 mg at 09/05/17 0852  . HYDROcodone-acetaminophen (NORCO/VICODIN) 5-325 MG per tablet 1-2 tablet  1-2 tablet Oral Q4H PRN Salary, Avel Peace, MD   1 tablet at 09/04/17 0250  . levothyroxine (SYNTHROID, LEVOTHROID) tablet 100 mcg  100 mcg Oral QAC breakfast Salary, Holly Bodily D, MD   100 mcg at 09/05/17 0855  . metoprolol succinate (TOPROL-XL) 24 hr tablet 25 mg  25 mg Oral Daily Loletha Grayer, MD   25 mg at 09/05/17 0852  . multivitamin with minerals tablet 1 tablet  1 tablet Oral Daily Salary, Avel Peace, MD   1 tablet at  09/04/17 0911  . ondansetron (ZOFRAN) tablet 4 mg  4 mg Oral Q6H PRN Salary, Montell D, MD       Or  . ondansetron (ZOFRAN) injection 4 mg  4 mg Intravenous Q6H PRN Salary, Montell D, MD      . pneumococcal 23 valent vaccine (PNU-IMMUNE) injection 0.5 mL  0.5 mL Intramuscular Tomorrow-1000 Salary, Montell D, MD      . polyethylene glycol (MIRALAX / GLYCOLAX) packet 17 g  17 g Oral Daily PRN Salary, Montell D, MD      . rOPINIRole (REQUIP) tablet 0.5 mg  0.5 mg Oral TID WC & HS Loletha Grayer, MD   0.5 mg at 09/05/17 1211  . sodium chloride flush (NS) 0.9 % injection 3 mL  3 mL Intravenous Q12H Salary, Montell D, MD   3 mL at 09/04/17 2253  . warfarin (COUMADIN) tablet 5 mg  5 mg Oral ONCE-1800 Rocky Morel, RPH      . Warfarin - Pharmacist Dosing Inpatient   Does not apply q1800 Rocky Morel, [facility]        Past Medical History:  Diagnosis Date  . Atrial fibrillation ([place])   . Cardiomyopathy ([place])   . Coronary artery disease   . Hypertension   . Hypothyroidism   . Parkinson's disease ([place])  Past Surgical History:  Procedure Laterality Date  . CORONARY ANGIOPLASTY      Social History Social History   Tobacco Use  . Smoking status: Never Smoker  . Smokeless tobacco: Never Used  Substance Use Topics  . Alcohol use: No  . Drug use: No    Family History Family History  Problem Relation Age of Onset  . Lung cancer Mother   . Lung cancer Father   No bleeding or clotting disorders  No Known Allergies   REVIEW OF SYSTEMS (Negative unless checked)  Constitutional: [] Weight loss  [] Fever  [] Chills Cardiac: [] Chest pain   [] Chest pressure   [x] Palpitations   [] Shortness of breath when laying flat   [] Shortness of breath at rest   [] Shortness of breath with exertion. Vascular:  [] Pain in legs with walking   [] Pain in legs at rest   [] Pain in legs when laying flat   [] Claudication   [] Pain in feet when walking  [] Pain in feet at rest  [] Pain in feet when laying flat    [] History of DVT   [] Phlebitis   [x] Swelling in legs   [] Varicose veins   [] Non-healing ulcers Pulmonary:   [] Uses home oxygen   [] Productive cough   [] Hemoptysis   [] Wheeze  [] COPD   [] Asthma Neurologic:  [] Dizziness  [] Blackouts   [] Seizures   [] History of stroke   [] History of TIA  [] Aphasia   [] Temporary blindness   [] Dysphagia   [] Weakness or numbness in arms   [] Weakness or numbness in legs Musculoskeletal:  [x] Arthritis   [] Joint swelling   [] Joint pain   [] Low back pain Hematologic:  [] Easy bruising  [] Easy bleeding   [] Hypercoagulable state   [] Anemic  [] Hepatitis Gastrointestinal:  [] Blood in stool   [] Vomiting blood  [x] Gastroesophageal reflux/heartburn   [] Difficulty swallowing. Genitourinary:  [x] Chronic kidney disease   [] Difficult urination  [] Frequent urination  [] Burning with urination   [x] Blood in urine Skin:  [] Rashes   [] Ulcers   [] Wounds Psychological:  [] History of anxiety   []  History of major depression.       Physical Examination  Vitals:   09/04/17 1333 09/04/17 1927 09/05/17 0500 09/05/17 0800  BP: 100/72 124/78  111/68  Pulse: 87 92  77  Resp: 18 18  16   Temp: 98.5 F (36.9 C) 98 F (36.7 C)  (!) 96.5 F (35.8 C)  TempSrc: Oral   Oral  SpO2: 99% 98%  98%  Weight:   75.9 kg (167 lb 6.4 oz)   Height:       Body mass index is 23.35 kg/m. Gen: WD/WN Head: [place]/AT, No temporalis wasting Ear/Nose/Throat: Hearing grossly intact, nares w/o erythema or drainage Eyes: Sclera non-icteric, conjunctiva clear Neck: Supple, no nuchal rigidity.  No JVD.  Pulmonary:  Good air movement, respirations not labored, no use of accessory muscles Cardiac: irregularly irregular Vascular: palpable pedal pulses bilaterally Gastrointestinal: soft, non-tender/non-distended. No guarding/reflex.  Musculoskeletal: M/S 5/5 throughout.  Extremities without ischemic changes.  No deformity or atrophy. 1+ Edema in the lower extremities bilaterally Neurologic: awake, alert and oriented,  non-focal Psychiatric: normal affect and mood Dermatologic: No rashes or ulcers noted.   Lymph : No Cervical, Axillary, or Inguinal lymphadenopathy.      CBC Lab Results  Component Value Date   WBC 12.5 (H) 09/05/2017   HGB 13.1 09/05/2017   HCT 39.3 (L) 09/05/2017   MCV 92.0 09/05/2017   PLT 173 09/05/2017    BMET    Component Value Date/Time  NA 133 (L) 09/05/2017 0346   K 4.1 09/05/2017 0346   CL 101 09/05/2017 0346   CO2 24 09/05/2017 0346   GLUCOSE 107 (H) 09/05/2017 0346   BUN 73 (H) 09/05/2017 0346   CREATININE 4.94 (H) 09/05/2017 0346   CALCIUM 8.4 (L) 09/05/2017 0346   GFRNONAA 11 (L) 09/05/2017 0346   GFRAA 13 (L) 09/05/2017 0346   Estimated Creatinine Clearance: 15.9 mL/min (A) (by C-G formula based on SCr of 4.94 mg/dL (H)).  COAG Lab Results  Component Value Date   INR 2.68 09/05/2017   INR 3.05 09/04/2017   INR 2.85 06/03/2017    Radiology Ct Head Wo Contrast  Result Date: 09/03/2017 CLINICAL DATA:  Advanced Parkinson's disease. Fall. Low back pain and neck pain. EXAM: CT HEAD WITHOUT CONTRAST CT CERVICAL SPINE WITHOUT CONTRAST TECHNIQUE: Multidetector CT imaging of the head and cervical spine was performed following the standard protocol without intravenous contrast. Multiplanar CT image reconstructions of the cervical spine were also generated. COMPARISON:  None. FINDINGS: CT HEAD FINDINGS Brain: No acute intracranial hemorrhage. No focal mass lesion. No CT evidence of acute infarction. No midline shift or mass effect. No hydrocephalus. Basilar cisterns are patent. Vascular: No hyperdense vessel or unexpected calcification. Skull: Normal. Negative for fracture or focal lesion. Sinuses/Orbits: Paranasal sinuses and mastoid air cells are clear. Orbits are clear. Other: None. CT CERVICAL SPINE FINDINGS Alignment: Reversal normal cervical lordosis.  No subluxation. Skull base and vertebrae: Normal craniocervical junction. No loss of bowel vertebral body  height or disc height. Normal facet articulation. No evidence of fracture. Soft tissues and spinal canal: No prevertebral soft tissue swelling. No perispinal or epidural hematoma. Disc levels:  Unremarkable Upper chest: Clear Other: None IMPRESSION: 1. No intracranial trauma. 2. No cervical spine fracture. 3. Straightening of the normal cervical lordosis may be secondary to position, muscle spasm, or ligamentous injury. Electronically Signed   By: Suzy Bouchard M.D.   On: 09/03/2017 16:30   Ct Cervical Spine Wo Contrast  Result Date: 09/03/2017 CLINICAL DATA:  Advanced Parkinson's disease. Fall. Low back pain and neck pain. EXAM: CT HEAD WITHOUT CONTRAST CT CERVICAL SPINE WITHOUT CONTRAST TECHNIQUE: Multidetector CT imaging of the head and cervical spine was performed following the standard protocol without intravenous contrast. Multiplanar CT image reconstructions of the cervical spine were also generated. COMPARISON:  None. FINDINGS: CT HEAD FINDINGS Brain: No acute intracranial hemorrhage. No focal mass lesion. No CT evidence of acute infarction. No midline shift or mass effect. No hydrocephalus. Basilar cisterns are patent. Vascular: No hyperdense vessel or unexpected calcification. Skull: Normal. Negative for fracture or focal lesion. Sinuses/Orbits: Paranasal sinuses and mastoid air cells are clear. Orbits are clear. Other: None. CT CERVICAL SPINE FINDINGS Alignment: Reversal normal cervical lordosis.  No subluxation. Skull base and vertebrae: Normal craniocervical junction. No loss of bowel vertebral body height or disc height. Normal facet articulation. No evidence of fracture. Soft tissues and spinal canal: No prevertebral soft tissue swelling. No perispinal or epidural hematoma. Disc levels:  Unremarkable Upper chest: Clear Other: None IMPRESSION: 1. No intracranial trauma. 2. No cervical spine fracture. 3. Straightening of the normal cervical lordosis may be secondary to position, muscle spasm, or  ligamentous injury. Electronically Signed   By: Suzy Bouchard M.D.   On: 09/03/2017 16:30   Ct Lumbar Spine Wo Contrast  Result Date: 09/03/2017 CLINICAL DATA:  66 y/o  M; status post fall with lower back pain. EXAM: CT LUMBAR SPINE WITHOUT CONTRAST TECHNIQUE: Multidetector CT imaging  of the lumbar spine was performed without intravenous contrast administration. Multiplanar CT image reconstructions were also generated. COMPARISON:  None. FINDINGS: Segmentation: Rudimentary L1 vertebral body ribs. Last complete intervertebral disc labeled as L5-S1. Alignment: Mild lumbar levocurvature with apex at L4. Straightening of lumbar lordosis without listhesis. Vertebrae: No acute fracture or focal pathologic process. Paraspinal and other soft tissues: Calcific aortic atherosclerosis. Disc levels: Lumbar spondylosis with prominent discogenic degenerative changes at L4-5 and L5-S1 levels. History facet disease contributes to mild canal stenosis at L4-5 and L5-S1. Disc bulges and endplate marginal osteophytes combined with ligamentum flavum and facet hypertrophy result in foraminal stenosis that is mild at the left L2-3 through L5-S1 neural foramen and on the right at L3-4 and L5-S1. The right L4-5 neural foramen is moderately stenosed. IMPRESSION: 1. No acute fracture or dislocation identified. 2. Mild lumbar levocurvature and spondylosis greatest at L4-5 and L5-S1 levels. 3. Multifactorial mild L4-5 and L5-S1 canal stenosis. 4. Multilevel mild foraminal stenosis with moderate right L4-5 foraminal stenosis. 5. Abdominal mild aortic calcific atherosclerosis. Electronically Signed   By: Kristine Garbe M.D.   On: 09/03/2017 16:31   US Renal  Result Date: 09/04/2017 CLINICAL DATA:  Renal injury after fall.  Elevated serum creatinine. EXAM: RENAL / URINARY TRACT ULTRASOUND COMPLETE COMPARISON:  None. FINDINGS: Right Kidney: Length: 11.6 cm. Echogenicity and renal cortical thickness are within normal limits.  No mass, perinephric fluid, or hydronephrosis visualized. No sonographically demonstrable calculus or ureterectasis. Left Kidney: Length: 11.4 cm. Echogenicity and renal cortical thickness are within normal limits. No mass, perinephric fluid, or hydronephrosis visualized. No sonographically demonstrable calculus or ureterectasis. Bladder: Appears normal for degree of bladder distention. Prostate measures 3.2 x 2.3 x 4.4 cm with a mildly inhomogeneous appearance. IMPRESSION: Mildly inhomogeneous appearance the prostate. This finding may warrant PSA correlation and direct clinical examination. Study otherwise unremarkable. Electronically Signed   By: Lowella Grip III M.D.   On: 09/04/2017 08:31   Dg Chest Portable 1 View  Result Date: 09/03/2017 CLINICAL DATA:  Congestion EXAM: PORTABLE CHEST 1 VIEW COMPARISON:  05/11/2007 FINDINGS: No consolidation or effusion.  Mild cardiomegaly.  No pneumothorax. IMPRESSION: Mild cardiomegaly. Negative for edema or focal pulmonary infiltrate. Electronically Signed   By: Donavan Foil M.D.   On: 09/03/2017 20:13      Assessment/Plan 1. ARF.  For dialysis tomorrow morning and needs a dialysis catheter 2.  Atrial fibrillation and heart disease.  Can maintain anticoagulation 3.  Hypertension.  Stable on outpatient medications and blood pressure control important in reducing the progression of atherosclerotic disease. On appropriate oral medications.    We will proceed with temporary dialysis catheter placement at this time.  Risks and benefits discussed with patient and/or family, and the catheter will be placed to allow immediate initiation of dialysis.  If the patient's renal function does not improve throughout the hospital course, we will be happy to place a tunneled dialysis catheter for long term use prior to discharge.     Leotis Pain, MD  09/05/2017 5:31 PM

## 2017-09-05 NOTE — Op Note (Signed)
  OPERATIVE NOTE   PROCEDURE: 1. Ultrasound guidance for vascular access right femoral vein 2. Placement of a 30 cm triple lumen dialysis catheter right femoral vein  PRE-OPERATIVE DIAGNOSIS: 1. Acute renal failure   POST-OPERATIVE DIAGNOSIS: Same  SURGEON: Leotis Pain, MD  ASSISTANT(S): None  ANESTHESIA: local  ESTIMATED BLOOD LOSS: Minimal   FINDING(S): 1. None  SPECIMEN(S): None  INDICATIONS:  Patient is a 66 y.o.male who presents with renal failure.  Risks and benefits were discussed, and informed consent was obtained..  DESCRIPTION: After obtaining full informed written consent, the patient was laid flat in the bed. The right groin was sterilely prepped and draped in a sterile surgical field was created. The right femoral vein was visualized with ultrasound and found to be widely patent. It was then accessed under direct guidance without difficulty with a Seldinger needle and a permanent image was recorded. A J-wire was then placed. After skin nick and dilatation, a 30 cm triple lumen dialysis catheter was placed over the wire and the wire was removed. The lumens withdrew dark red nonpulsatile blood and flushed easily with sterile saline. The catheter was secured to the skin with 3 nylon sutures. Sterile dressing was placed.  COMPLICATIONS: None  CONDITION: Stable  Leotis Pain 09/05/2017 5:35 PM  This note was created with Dragon Medical transcription system. Any errors in dictation are purely unintentional.

## 2017-09-05 NOTE — Progress Notes (Signed)
Patient ID: Douglas Silva., male   DOB: 09-May-1951, 66 y.o.   MRN: [id number]  [facility] PROGRESS NOTE  Douglas Silva. NUU:[id number] DOB: 1950-12-30 DOA: 09/03/2017 PCP: [name], MD  HPI/Subjective: Patient feels okay.  Does not complain of any muscle aches or pains.  When he came in he did have some back pain near his left kidney.  Still has a tremor but that is normal for him.  Objective: Vitals:   09/04/17 1333 09/04/17 1927  BP: 100/72 124/78  Pulse: 87 92  Resp: 18 18  Temp: 98.5 F (36.9 C) 98 F (36.7 C)  SpO2: 99% 98%    Filed Weights   09/04/17 0054 09/04/17 0500 09/05/17 0500  Weight: 73.8 kg (162 lb 12.8 oz) 73.8 kg (162 lb 12.8 oz) 75.9 kg (167 lb 6.4 oz)    ROS: Review of Systems  Constitutional: Negative for chills and fever.  Eyes: Negative for blurred vision.  Respiratory: Negative for cough and shortness of breath.   Cardiovascular: Negative for chest pain.  Gastrointestinal: Negative for abdominal pain, constipation, diarrhea, nausea and vomiting.  Genitourinary: Negative for dysuria.  Musculoskeletal: Negative for back pain and joint pain.  Neurological: Positive for tremors. Negative for dizziness and headaches.   Exam: Physical Exam  HENT:  Nose: No mucosal edema.  Mouth/Throat: No oropharyngeal exudate or posterior oropharyngeal edema.  Eyes: Conjunctivae, EOM and lids are normal. Pupils are equal, round, and reactive to light.  Neck: No JVD present. Carotid bruit is not present. No edema present. No thyroid mass and no thyromegaly present.  Cardiovascular: S1 normal and S2 normal. Exam reveals no gallop.  No murmur heard. Pulses:      Dorsalis pedis pulses are 2+ on the right side, and 2+ on the left side.  Respiratory: No respiratory distress. He has no wheezes. He has no rhonchi. He has no rales.  GI: Soft. Bowel sounds are normal. There is no tenderness.  Musculoskeletal:       Right shoulder: He exhibits no  swelling.  Lymphadenopathy:    He has no cervical adenopathy.  Neurological: He is alert. No cranial nerve deficit.  Skin: Skin is warm. No rash noted. Nails show no clubbing.  Bruising left knee from previous fall  Psychiatric: He has a normal mood and affect.      Data Reviewed: Basic Metabolic Panel: Recent Labs  Lab 09/03/17 1754 09/04/17 0303 09/05/17 0346  NA 137 137 133*  K 3.3* 3.7 4.1  CL 102 102 101  CO2 24 23 24   GLUCOSE 92 116* 107*  BUN 38* 49* 73*  CREATININE 2.07* 2.57* 4.94*  CALCIUM 8.8* 8.5* 8.4*   CBC: Recent Labs  Lab 09/03/17 1754 09/04/17 0303 09/05/17 0346  WBC 26.0* 18.0* 12.5*  HGB 14.5 13.6 13.1  HCT 43.8 41.1 39.3*  MCV 92.1 91.8 92.0  PLT 205 206 173     Studies: Ct Head Wo Contrast  Result Date: 09/03/2017 CLINICAL DATA:  Advanced Parkinson's disease. Fall. Low back pain and neck pain. EXAM: CT HEAD WITHOUT CONTRAST CT CERVICAL SPINE WITHOUT CONTRAST TECHNIQUE: Multidetector CT imaging of the head and cervical spine was performed following the standard protocol without intravenous contrast. Multiplanar CT image reconstructions of the cervical spine were also generated. COMPARISON:  None. FINDINGS: CT HEAD FINDINGS Brain: No acute intracranial hemorrhage. No focal mass lesion. No CT evidence of acute infarction. No midline shift or mass effect. No hydrocephalus. Basilar cisterns are patent. Vascular: No  hyperdense vessel or unexpected calcification. Skull: Normal. Negative for fracture or focal lesion. Sinuses/Orbits: Paranasal sinuses and mastoid air cells are clear. Orbits are clear. Other: None. CT CERVICAL SPINE FINDINGS Alignment: Reversal normal cervical lordosis.  No subluxation. Skull base and vertebrae: Normal craniocervical junction. No loss of bowel vertebral body height or disc height. Normal facet articulation. No evidence of fracture. Soft tissues and spinal canal: No prevertebral soft tissue swelling. No perispinal or epidural  hematoma. Disc levels:  Unremarkable Upper chest: Clear Other: None IMPRESSION: 1. No intracranial trauma. 2. No cervical spine fracture. 3. Straightening of the normal cervical lordosis may be secondary to position, muscle spasm, or ligamentous injury. Electronically Signed   By: Suzy Bouchard M.D.   On: 09/03/2017 16:30   Ct Cervical Spine Wo Contrast  Result Date: 09/03/2017 CLINICAL DATA:  Advanced Parkinson's disease. Fall. Low back pain and neck pain. EXAM: CT HEAD WITHOUT CONTRAST CT CERVICAL SPINE WITHOUT CONTRAST TECHNIQUE: Multidetector CT imaging of the head and cervical spine was performed following the standard protocol without intravenous contrast. Multiplanar CT image reconstructions of the cervical spine were also generated. COMPARISON:  None. FINDINGS: CT HEAD FINDINGS Brain: No acute intracranial hemorrhage. No focal mass lesion. No CT evidence of acute infarction. No midline shift or mass effect. No hydrocephalus. Basilar cisterns are patent. Vascular: No hyperdense vessel or unexpected calcification. Skull: Normal. Negative for fracture or focal lesion. Sinuses/Orbits: Paranasal sinuses and mastoid air cells are clear. Orbits are clear. Other: None. CT CERVICAL SPINE FINDINGS Alignment: Reversal normal cervical lordosis.  No subluxation. Skull base and vertebrae: Normal craniocervical junction. No loss of bowel vertebral body height or disc height. Normal facet articulation. No evidence of fracture. Soft tissues and spinal canal: No prevertebral soft tissue swelling. No perispinal or epidural hematoma. Disc levels:  Unremarkable Upper chest: Clear Other: None IMPRESSION: 1. No intracranial trauma. 2. No cervical spine fracture. 3. Straightening of the normal cervical lordosis may be secondary to position, muscle spasm, or ligamentous injury. Electronically Signed   By: Suzy Bouchard M.D.   On: 09/03/2017 16:30   Ct Lumbar Spine Wo Contrast  Result Date: 09/03/2017 CLINICAL DATA:   66 y/o  M; status post fall with lower back pain. EXAM: CT LUMBAR SPINE WITHOUT CONTRAST TECHNIQUE: Multidetector CT imaging of the lumbar spine was performed without intravenous contrast administration. Multiplanar CT image reconstructions were also generated. COMPARISON:  None. FINDINGS: Segmentation: Rudimentary L1 vertebral body ribs. Last complete intervertebral disc labeled as L5-S1. Alignment: Mild lumbar levocurvature with apex at L4. Straightening of lumbar lordosis without listhesis. Vertebrae: No acute fracture or focal pathologic process. Paraspinal and other soft tissues: Calcific aortic atherosclerosis. Disc levels: Lumbar spondylosis with prominent discogenic degenerative changes at L4-5 and L5-S1 levels. History facet disease contributes to mild canal stenosis at L4-5 and L5-S1. Disc bulges and endplate marginal osteophytes combined with ligamentum flavum and facet hypertrophy result in foraminal stenosis that is mild at the left L2-3 through L5-S1 neural foramen and on the right at L3-4 and L5-S1. The right L4-5 neural foramen is moderately stenosed. IMPRESSION: 1. No acute fracture or dislocation identified. 2. Mild lumbar levocurvature and spondylosis greatest at L4-5 and L5-S1 levels. 3. Multifactorial mild L4-5 and L5-S1 canal stenosis. 4. Multilevel mild foraminal stenosis with moderate right L4-5 foraminal stenosis. 5. Abdominal mild aortic calcific atherosclerosis. Electronically Signed   By: Kristine Garbe M.D.   On: 09/03/2017 16:31   US Renal  Result Date: 09/04/2017 CLINICAL DATA:  Renal injury after  fall.  Elevated serum creatinine. EXAM: RENAL / URINARY TRACT ULTRASOUND COMPLETE COMPARISON:  None. FINDINGS: Right Kidney: Length: 11.6 cm. Echogenicity and renal cortical thickness are within normal limits. No mass, perinephric fluid, or hydronephrosis visualized. No sonographically demonstrable calculus or ureterectasis. Left Kidney: Length: 11.4 cm. Echogenicity and renal  cortical thickness are within normal limits. No mass, perinephric fluid, or hydronephrosis visualized. No sonographically demonstrable calculus or ureterectasis. Bladder: Appears normal for degree of bladder distention. Prostate measures 3.2 x 2.3 x 4.4 cm with a mildly inhomogeneous appearance. IMPRESSION: Mildly inhomogeneous appearance the prostate. This finding may warrant PSA correlation and direct clinical examination. Study otherwise unremarkable. Electronically Signed   By: Lowella Grip III M.D.   On: 09/04/2017 08:31   Dg Chest Portable 1 View  Result Date: 09/03/2017 CLINICAL DATA:  Congestion EXAM: PORTABLE CHEST 1 VIEW COMPARISON:  05/11/2007 FINDINGS: No consolidation or effusion.  Mild cardiomegaly.  No pneumothorax. IMPRESSION: Mild cardiomegaly. Negative for edema or focal pulmonary infiltrate. Electronically Signed   By: Donavan Foil M.D.   On: 09/03/2017 20:13    Scheduled Meds: . finasteride  5 mg Oral Daily  . levothyroxine  100 mcg Oral QAC breakfast  . metoprolol succinate  25 mg Oral Daily  . multivitamin with minerals  1 tablet Oral Daily  . pneumococcal 23 valent vaccine  0.5 mL Intramuscular Tomorrow-1000  . rOPINIRole  0.5 mg Oral TID WC & HS  . sodium chloride flush  3 mL Intravenous Q12H   Continuous Infusions: . sodium chloride 75 mL/hr (09/04/17 2207)    Assessment/Plan:  1. Acute rhabdomyolysis.  Patient is not the best historian so unclear how many falls and how long he was on the ground.  Stop Zocor which could be contributing.  CPK yesterday was 39,269.  I will check again tomorrow.  Continue IV fluid hydration.   2. Acute kidney injury secondary to dehydration, relative hypotension and acute rhabdomyolysis.  Case discussed with nephrology Dr. Candiss Norse to see the patient and evaluate for temporary dialysis. 3. Relative hypotension.  Continue IV fluids. 4. BPH.  Started finasteride.  With Parkinson's Flomax is likely not a good medication for this  patient.  PSA normal range. 5. Hypothyroidism unspecified on levothyroxine.  TSH normal range. 6. Atrial fibrillation history rate control with metoprolol on Coumadin and pharmacy is dosing. 7. Full physical therapy evaluation 8. Parkinson's disease on Requip 9. Hyperlipidemia unspecified.  Stop simvastatin.  Add on a CPK  Code Status:     Code Status Orders  (From admission, onward)        Start     Ordered   09/04/17 0042  Full code  Continuous     09/04/17 0041    Code Status History    Date Active Date Inactive Code Status Order ID Comments User Context   06/01/2017 20:14 06/03/2017 15:06 Full Code [id number]  Loletha Grayer, MD ED     Family Communication: Patient's aunt on the phone Disposition Plan: Family may be interested in assisted living  Consultants:  Nephrology  Time spent: 28 minutes, case discussed with Nephrology.  Loletha Grayer  [employer]

## 2017-09-05 NOTE — Progress Notes (Signed)
ANTICOAGULATION CONSULT NOTE - Follow up [place] for warfarin Indication: atrial fibrillation  No Known Allergies  Patient Measurements: Height: 5\' 11"  (180.3 cm) Weight: 167 lb 6.4 oz (75.9 kg) IBW/kg (Calculated) : 75.3   Vital Signs: Temp: 96.5 F (35.8 C) (11/12 0800) Temp Source: Oral (11/12 0800) BP: 111/68 (11/12 0800) Pulse Rate: 77 (11/12 0800)  Labs: Recent Labs    09/03/17 1754 09/04/17 0303 09/05/17 0346  HGB 14.5 13.6 13.1  HCT 43.8 41.1 39.3*  PLT 205 206 173  LABPROT  --  31.3* 28.3*  INR  --  3.05 2.68  CREATININE 2.07* 2.57* 4.94*  CKTOTAL  --  39,269*  --     Estimated Creatinine Clearance: 15.9 mL/min (A) (by C-G formula based on SCr of 4.94 mg/dL (H)).   Medical History: Past Medical History:  Diagnosis Date  . Atrial fibrillation ([place])   . Cardiomyopathy ([place])   . Coronary artery disease   . Hypertension   . Hypothyroidism   . Parkinson's disease [facility])      Assessment: Patient admitted for fall, is chronically anticoagulated w/ warfarin for afib. CT head with no acute intracranial hemorrhage. Spoke with pt about warfarin who states he takes one whole tab daily of pink/peach color tablet (could not state mg strength so asked color of tablet); home dose listed as 5 mg daily on PTA med list.   Date INR Dose 11/11 3.05 Held 11/12 2.68  Goal of Therapy:  INR 2-3 Monitor platelets by anticoagulation protocol: Yes   Plan:  INR today therapeutic. Will order home dose of warfarin 5 mg x1 for tonight and follow up INR in AM. Daily INR and CBC at least every three days per policy.   Rayna Sexton, PharmD, BCPS Clinical Pharmacist 09/05/2017 8:56 AM

## 2017-09-06 ENCOUNTER — Encounter: Payer: Self-pay | Admitting: Registered Nurse

## 2017-09-06 ENCOUNTER — Encounter: Payer: Self-pay | Admitting: Vascular Surgery

## 2017-09-06 LAB — PROTIME-INR
INR: 2.29
Prothrombin Time: 25 seconds — ABNORMAL HIGH (ref 11.4–15.2)

## 2017-09-06 LAB — CBC
HEMATOCRIT: 36.6 % — AB (ref 40.0–52.0)
HEMOGLOBIN: 11.9 g/dL — AB (ref 13.0–18.0)
MCH: 30 pg (ref 26.0–34.0)
MCHC: 32.5 g/dL (ref 32.0–36.0)
MCV: 92.3 fL (ref 80.0–100.0)
Platelets: 152 10*3/uL (ref 150–440)
RBC: 3.97 MIL/uL — ABNORMAL LOW (ref 4.40–5.90)
RDW: 13.4 % (ref 11.5–14.5)
WBC: 10.7 10*3/uL — AB (ref 3.8–10.6)

## 2017-09-06 LAB — BASIC METABOLIC PANEL
ANION GAP: 9 (ref 5–15)
BUN: 81 mg/dL — ABNORMAL HIGH (ref 6–20)
CHLORIDE: 102 mmol/L (ref 101–111)
CO2: 20 mmol/L — AB (ref 22–32)
Calcium: 8 mg/dL — ABNORMAL LOW (ref 8.9–10.3)
Creatinine, Ser: 7.11 mg/dL — ABNORMAL HIGH (ref 0.61–1.24)
GFR calc non Af Amer: 7 mL/min — ABNORMAL LOW (ref >60)
GFR, EST AFRICAN AMERICAN: 8 mL/min — AB (ref >60)
Glucose, Bld: 94 mg/dL (ref 65–99)
POTASSIUM: 4 mmol/L (ref 3.5–5.1)
Sodium: 131 mmol/L — ABNORMAL LOW (ref 135–145)

## 2017-09-06 LAB — CK: Total CK: 11408 U/L — ABNORMAL HIGH (ref 49–397)

## 2017-09-06 MED ORDER — WARFARIN SODIUM 5 MG PO TABS
5.0000 mg | ORAL_TABLET | Freq: Every day | ORAL | Status: DC
  Administered 2017-09-06: 5 mg via ORAL
  Filled 2017-09-06 (×2): qty 1

## 2017-09-06 MED ORDER — ALUM & MAG HYDROXIDE-SIMETH 200-200-20 MG/5ML PO SUSP
30.0000 mL | ORAL | Status: DC | PRN
  Administered 2017-09-06 – 2017-09-09 (×2): 30 mL via ORAL
  Filled 2017-09-06 (×2): qty 30

## 2017-09-06 NOTE — Progress Notes (Signed)
Plan is for patient to D/C to [facility] for short term rehab when stable. Patient had dialysis today and per RN it is undermined if patient will need outpatient dialysis for long term. [facility] admissions coordinator at [facility] is aware of above. Clinical Social Worker (CSW) will continue to follow and assist as needed.    [employer], LCSW [phone]

## 2017-09-06 NOTE — Progress Notes (Signed)
[place], [place] 09/06/17  Subjective:  Patient is doing fair. Dialysis catheter was placed last evening. Serum creatinine has further worsened to 7.1 today, BUN 81.  Potassium is normal at 4.0 CK level has improved to 11,400 Hemoglobin 11.9 INR is 2.3 Urinalysis shows large hemoglobin, protein greater than 300, too numerous to count RBCs   HEMODIALYSIS FLOWSHEET:  Blood Flow Rate (mL/min): 200 mL/min Arterial Pressure (mmHg): -120 mmHg Venous Pressure (mmHg): 170 mmHg Transmembrane Pressure (mmHg): 50 mmHg Ultrafiltration Rate (mL/min): 260 mL/min Dialysate Flow Rate (mL/min): 500 ml/min Conductivity: Machine : 14.3 Conductivity: Machine : 14.3 Dialysis Fluid Bolus: Normal Saline Bolus Amount (mL): 100 mL Dialysate Change: 2K     Objective:  Vital signs in last 24 hours:  Temp:  [98 F (36.7 C)-98.9 F (37.2 C)] 98 F (36.7 C) (11/13 0957) Pulse Rate:  [52-81] 57 (11/13 1200) Resp:  [16-28] 28 (11/13 1200) BP: (98-128)/(52-87) 98/84 (11/13 1200) SpO2:  [96 %-100 %] 98 % (11/13 1200) Weight:  [80.1 kg (176 lb 8 oz)-81.6 kg (179 lb 14.4 oz)] 81.6 kg (179 lb 14.4 oz) (11/13 0955)  Weight change: 4.128 kg (9 lb 1.6 oz) Filed Weights   09/05/17 0500 09/06/17 0500 09/06/17 0955  Weight: 75.9 kg (167 lb 6.4 oz) 80.1 kg (176 lb 8 oz) 81.6 kg (179 lb 14.4 oz)    Intake/Output:    Intake/Output Summary (Last 24 hours) at 09/06/2017 1211 Last data filed at 09/06/2017 0900 Gross per 24 hour  Intake 2137.5 ml  Output 300 ml  Net 1837.5 ml     Physical Exam: General:  No acute distress, laying in the bed  HEENT  anicteric, moist oral mucous membranes  Neck  no distended neck veins  Pulm/lungs  coarse breath sounds at bases otherwise clear,  CVS/Heart  bradycardic, irregular  Abdomen:   Soft, nontender  Extremities:  Trace edema  Neurologic:  Alert, oriented, Parkinson's tremors  Skin:  No acute rashes  Access:  Temporary dialysis  catheter       Basic Metabolic Panel:  Recent Labs  Lab 09/03/17 1754 09/04/17 0303 09/05/17 0346 09/06/17 0333  NA 137 137 133* 131*  K 3.3* 3.7 4.1 4.0  CL 102 102 101 102  CO2 24 23 24  20*  GLUCOSE 92 116* 107* 94  BUN 38* 49* 73* 81*  CREATININE 2.07* 2.57* 4.94* 7.11*  CALCIUM 8.8* 8.5* 8.4* 8.0*     CBC: Recent Labs  Lab 09/03/17 1754 09/04/17 0303 09/05/17 0346 09/06/17 0333  WBC 26.0* 18.0* 12.5* 10.7*  HGB 14.5 13.6 13.1 11.9*  HCT 43.8 41.1 39.3* 36.6*  MCV 92.1 91.8 92.0 92.3  PLT 205 206 173 152     No results found for: HEPBSAG, HEPBSAB, HEPBIGM    Microbiology:  No results found for this or any previous visit (from the past 240 hour(s)).  Coagulation Studies: Recent Labs    09/04/17 0303 09/05/17 0346 09/06/17 0333  LABPROT 31.3* 28.3* 25.0*  INR 3.05 2.68 2.29    Urinalysis: Recent Labs    09/03/17 2124 09/04/17 2203  COLORURINE BROWN* YELLOW*  LABSPEC 1.017 1.010  PHURINE TEST NOT REPORTED DUE TO COLOR INTERFERENCE OF URINE PIGMENT 6.0  GLUCOSEU TEST NOT REPORTED DUE TO COLOR INTERFERENCE OF URINE PIGMENT* 50*  HGBUR TEST NOT REPORTED DUE TO COLOR INTERFERENCE OF URINE PIGMENT* LARGE*  BILIRUBINUR TEST NOT REPORTED DUE TO COLOR INTERFERENCE OF URINE PIGMENT* NEGATIVE  KETONESUR TEST NOT REPORTED DUE TO COLOR INTERFERENCE OF  URINE PIGMENT* NEGATIVE  PROTEINUR TEST NOT REPORTED DUE TO COLOR INTERFERENCE OF URINE PIGMENT* >=300*  NITRITE TEST NOT REPORTED DUE TO COLOR INTERFERENCE OF URINE PIGMENT* NEGATIVE  LEUKOCYTESUR TEST NOT REPORTED DUE TO COLOR INTERFERENCE OF URINE PIGMENT* NEGATIVE      Imaging: No results found.   Medications:   . sodium chloride 75 mL/hr at 09/06/17 0033   . finasteride  5 mg Oral Daily  . levothyroxine  100 mcg Oral QAC breakfast  . metoprolol succinate  25 mg Oral Daily  . multivitamin with minerals  1 tablet Oral Daily  . pneumococcal 23 valent vaccine  0.5 mL Intramuscular Tomorrow-1000   . rOPINIRole  0.5 mg Oral TID WC & HS  . sodium chloride flush  3 mL Intravenous Q12H  . warfarin  5 mg Oral q1800  . Warfarin - Pharmacist Dosing Inpatient   Does not apply q1800   acetaminophen **OR** acetaminophen, alum & mag hydroxide-simeth, HYDROcodone-acetaminophen, ondansetron **OR** ondansetron (ZOFRAN) IV, polyethylene glycol  Assessment/ Plan:  66 y.o. Caucasian male With Parkinson's, hypertension, hypothyroidism, hyperlipidemia, atrial fibrillation requiring anticoagulation with warfarin.  He was admitted on 11/10 status post fall and was found to have  acute renal failure and rhabdomyolysis  1.  Acute renal failure.  Baseline creatinine 0.87 2.  Rhabdomyolysis 3.  Hypertension  Acute renal failure is likely secondary to ATN and rhabdomyolysis CK level has started to improve Patient seen during dialysis.  Tolerating well Evaluation for dialysis daily but likely will need it tomorrow morning. Hematuria noted.?  Associated with anticoagulation   LOS: 3 Douglas Silva 11/13/201812:11 PM  [place], [place]

## 2017-09-06 NOTE — Progress Notes (Signed)
PT Cancellation Note  Patient Details Name: Douglas Silva. MRN: [id number] DOB: February 01, 1951   Cancelled Treatment:    Reason Eval/Treat Not Completed: Patient declined, no reason specified;Other (comment). Treatment attempted; pt initially notes needing personal hygiene from having a bowel movement. Nursing contacted; waited care and re attempted PT. Pt has temporary femoral catheter and offered bed exercises. Pt noted to try and sit up when explaining exercises pt could perform per pt question. Pt educated on not bending R hip due to catheter and both therapist and nursing explained to pt/spouse activity allowed/not allowed and temporary versus permanent catheter. Pt ultimately refuses therapy at this time. Re attempt tomorrow; pt preference is morning.    Larae Grooms, PTA 09/06/2017, 4:48 PM

## 2017-09-06 NOTE — Progress Notes (Signed)
Patient ID: Douglas Corrente., male   DOB: 09-26-1951, 66 y.o.   MRN: [id number]  [facility] PROGRESS NOTE  Douglas Corrente. [id number]:[id number] DOB: [birth year] DOA: 09/03/2017 PCP: [name], MD  HPI/Subjective: Patient feeling a little bit better.  When he came in he had a pain in his left back.  States he does not have any muscle pain.  Objective: Vitals:   09/06/17 1322 09/06/17 1355  BP: 120/75 131/71  Pulse: 73 77  Resp: 20 20  Temp: 98.2 F (36.8 C) 98 F (36.7 C)  SpO2: 96% 100%    Filed Weights   09/06/17 0500 09/06/17 0955 09/06/17 1200  Weight: 80.1 kg (176 lb 8 oz) 81.6 kg (179 lb 14.4 oz) 81.6 kg (179 lb 14.3 oz)    ROS: Review of Systems  Constitutional: Negative for chills and fever.  Eyes: Negative for blurred vision.  Respiratory: Negative for cough and shortness of breath.   Cardiovascular: Negative for chest pain.  Gastrointestinal: Negative for abdominal pain, constipation, diarrhea, nausea and vomiting.  Genitourinary: Negative for dysuria.  Musculoskeletal: Negative for back pain and joint pain.  Neurological: Positive for tremors. Negative for dizziness and headaches.   Exam: Physical Exam  HENT:  Nose: No mucosal edema.  Mouth/Throat: No oropharyngeal exudate or posterior oropharyngeal edema.  Eyes: Conjunctivae, EOM and lids are normal. Pupils are equal, round, and reactive to light.  Neck: No JVD present. Carotid bruit is not present. No edema present. No thyroid mass and no thyromegaly present.  Cardiovascular: S1 normal and S2 normal. Exam reveals no gallop.  No murmur heard. Pulses:      Dorsalis pedis pulses are 2+ on the right side, and 2+ on the left side.  Respiratory: No respiratory distress. He has decreased breath sounds in the left lower field. He has no wheezes. He has no rhonchi. He has no rales.  GI: Soft. Bowel sounds are normal. There is no tenderness.  Musculoskeletal:       Right shoulder: He exhibits no  swelling.  Lymphadenopathy:    He has no cervical adenopathy.  Neurological: He is alert. No cranial nerve deficit.  Skin: Skin is warm. No rash noted. Nails show no clubbing.  Bruising left knee from previous fall  Psychiatric: He has a normal mood and affect.      Data Reviewed: Basic Metabolic Panel: Recent Labs  Lab 09/03/17 1754 09/04/17 0303 09/05/17 0346 09/06/17 0333  NA 137 137 133* 131*  K 3.3* 3.7 4.1 4.0  CL 102 102 101 102  CO2 24 23 24  20*  GLUCOSE 92 116* 107* 94  BUN 38* 49* 73* 81*  CREATININE 2.07* 2.57* 4.94* 7.11*  CALCIUM 8.8* 8.5* 8.4* 8.0*   CBC: Recent Labs  Lab 09/03/17 1754 09/04/17 0303 09/05/17 0346 09/06/17 0333  WBC 26.0* 18.0* 12.5* 10.7*  HGB 14.5 13.6 13.1 11.9*  HCT 43.8 41.1 39.3* 36.6*  MCV 92.1 91.8 92.0 92.3  PLT 205 206 173 152    Scheduled Meds: . finasteride  5 mg Oral Daily  . levothyroxine  100 mcg Oral QAC breakfast  . metoprolol succinate  25 mg Oral Daily  . multivitamin with minerals  1 tablet Oral Daily  . pneumococcal 23 valent vaccine  0.5 mL Intramuscular Tomorrow-1000  . rOPINIRole  0.5 mg Oral TID WC & HS  . sodium chloride flush  3 mL Intravenous Q12H  . warfarin  5 mg Oral q1800  . Warfarin - Pharmacist Dosing  Inpatient   Does not apply q1800   Continuous Infusions: . sodium chloride 75 mL/hr at 09/06/17 0033    Assessment/Plan:  1. Acute rhabdomyolysis.  Likely from falls and statin medication.  Stop Zocor.  CPK trending better from 39,269 to 11,408.  I will check again tomorrow.  Continue IV fluid hydration at lower rate 2. Acute kidney injury secondary to acute rhabdomyolysis, dehydration and relative hypotension.  Creatinine worsened to 7.11 this morning.  Since dialysis catheter was placed late yesterday evening, the first dialysis session was today.   3. BPH.  Started finasteride.  With Parkinson's Flomax is likely not a good medication for this patient.  PSA normal range. 4. Hypothyroidism  unspecified on levothyroxine.  TSH normal range. 5. Atrial fibrillation history rate control with metoprolol.  Continue Coumadin and pharmacy is dosing. 6. Full physical therapy evaluation 7. Parkinson's disease on Requip 8. Hyperlipidemia unspecified.  Stop simvastatin.  This medication will be discontinued since the patient has severe rhabdomyolysis  Code Status:     Code Status Orders  (From admission, onward)        Start     Ordered   09/04/17 0042  Full code  Continuous     09/04/17 0041    Code Status History    Date Active Date Inactive Code Status Order ID Comments User Context   06/01/2017 20:14 06/03/2017 15:06 Full Code [id number]  Loletha Grayer, MD ED     Family Communication: Patient's sister at the bedside Disposition Plan: Family may be interested in assisted living  Consultants:  Nephrology  Time spent: 26 minutes, case discussed with Nephrology.  Loletha Grayer  [employer]

## 2017-09-06 NOTE — Progress Notes (Signed)
HD initiated via R Fem cath without issue. No heparin treatment. No uf as ordered. Patient has no complaints currently.

## 2017-09-06 NOTE — Progress Notes (Signed)
ANTICOAGULATION CONSULT NOTE - Follow up Douglas Silva for warfarin Indication: atrial fibrillation  No Known Allergies  Patient Measurements: Height: 5\' 11"  (180.3 cm) Weight: 176 lb 8 oz (80.1 kg) IBW/kg (Calculated) : 75.3   Vital Signs: Temp: 98.9 F (37.2 C) (11/13 0716) Temp Source: Oral (11/13 0716) BP: 105/62 (11/13 0716) Pulse Rate: 61 (11/13 0716)  Labs: Recent Labs    09/04/17 0303 09/05/17 0346 09/06/17 0333  HGB 13.6 13.1 11.9*  HCT 41.1 39.3* 36.6*  PLT 206 173 152  LABPROT 31.3* 28.3* 25.0*  INR 3.05 2.68 2.29  CREATININE 2.57* 4.94* 7.11*  CKTOTAL 39,269*  --  11,408*    Estimated Creatinine Clearance: 11 mL/min (A) (by C-G formula based on SCr of 7.11 mg/dL (H)).   Medical History: Past Medical History:  Diagnosis Date  . Atrial fibrillation ([place])   . Cardiomyopathy ([place])   . Coronary artery disease   . Hypertension   . Hypothyroidism   . Parkinson's disease [facility])      Assessment: Patient admitted for fall, is chronically anticoagulated w/ warfarin for afib. CT head with no acute intracranial hemorrhage. Spoke with pt about warfarin who states he takes one whole tab daily of pink/peach color tablet (could not state mg strength so asked color of tablet); home dose listed as 5 mg daily on PTA med list.   Date INR Dose 11/11 3.05 Held 11/12 2.68 5 mg 11/13 2.29  Goal of Therapy:  INR 2-3 Monitor platelets by anticoagulation protocol: Yes   Plan:  INR therapeutic. Will continue home dose of warfarin 5 mg daily. Follow up INR in AM. Daily INR and CBC at least every three days per policy.   Chinita Greenland PharmD Clinical Pharmacist 09/06/2017

## 2017-09-06 NOTE — Progress Notes (Signed)
Post HD assessment unchanged  

## 2017-09-06 NOTE — Progress Notes (Signed)
Pre hd 

## 2017-09-06 NOTE — Progress Notes (Signed)
Pt returned from first dialysis session around 1315. VSS, site is CDI. Pt is A&O4, IV fluids restarted at 40cc/hr per order, urine output still poor. Pt states he is comfortable.   [place], Jerry Caras

## 2017-09-06 NOTE — Progress Notes (Signed)
PT Cancellation Note  Patient Details Name: Douglas Silva. MRN: [id number] DOB: 01-25-1951   Cancelled Treatment:    Reason Eval/Treat Not Completed: Patient at procedure or test/unavailable(Pt currently off floor for HD). Will continue to follow acutely.   Collie Siad PT, DPT 09/06/2017, 10:22 AM

## 2017-09-06 NOTE — Progress Notes (Signed)
HD completed without issue. Patient tolerated well. No UF as ordered.

## 2017-09-07 LAB — CBC
HCT: 34.7 % — ABNORMAL LOW (ref 40.0–52.0)
Hemoglobin: 11.7 g/dL — ABNORMAL LOW (ref 13.0–18.0)
MCH: 30.7 pg (ref 26.0–34.0)
MCHC: 33.7 g/dL (ref 32.0–36.0)
MCV: 90.8 fL (ref 80.0–100.0)
PLATELETS: 143 10*3/uL — AB (ref 150–440)
RBC: 3.82 MIL/uL — AB (ref 4.40–5.90)
RDW: 13.2 % (ref 11.5–14.5)
WBC: 9.5 10*3/uL (ref 3.8–10.6)

## 2017-09-07 LAB — RENAL FUNCTION PANEL
ANION GAP: 9 (ref 5–15)
Albumin: 2.4 g/dL — ABNORMAL LOW (ref 3.5–5.0)
BUN: 81 mg/dL — ABNORMAL HIGH (ref 6–20)
CO2: 24 mmol/L (ref 22–32)
Calcium: 8.2 mg/dL — ABNORMAL LOW (ref 8.9–10.3)
Chloride: 100 mmol/L — ABNORMAL LOW (ref 101–111)
Creatinine, Ser: 7.49 mg/dL — ABNORMAL HIGH (ref 0.61–1.24)
GFR, EST AFRICAN AMERICAN: 8 mL/min — AB (ref >60)
GFR, EST NON AFRICAN AMERICAN: 7 mL/min — AB (ref >60)
Glucose, Bld: 105 mg/dL — ABNORMAL HIGH (ref 65–99)
PHOSPHORUS: 5.3 mg/dL — AB (ref 2.5–4.6)
POTASSIUM: 4.1 mmol/L (ref 3.5–5.1)
SODIUM: 133 mmol/L — AB (ref 135–145)

## 2017-09-07 LAB — HEPATITIS B SURFACE ANTIBODY, QUANTITATIVE

## 2017-09-07 LAB — PROTIME-INR
INR: 2.02
PROTHROMBIN TIME: 22.7 s — AB (ref 11.4–15.2)

## 2017-09-07 LAB — CK: CK TOTAL: 5256 U/L — AB (ref 49–397)

## 2017-09-07 LAB — HEPATITIS B CORE ANTIBODY, TOTAL: HEP B C TOTAL AB: NEGATIVE

## 2017-09-07 LAB — HEPATITIS B SURFACE ANTIGEN: Hepatitis B Surface Ag: NEGATIVE

## 2017-09-07 MED ORDER — WARFARIN SODIUM 6 MG PO TABS
6.0000 mg | ORAL_TABLET | Freq: Once | ORAL | Status: AC
  Administered 2017-09-07: 6 mg via ORAL
  Filled 2017-09-07 (×2): qty 1

## 2017-09-07 MED ORDER — POLYETHYLENE GLYCOL 3350 17 G PO PACK
17.0000 g | PACK | Freq: Every day | ORAL | Status: DC
  Administered 2017-09-10 – 2017-09-16 (×6): 17 g via ORAL
  Filled 2017-09-07 (×8): qty 1

## 2017-09-07 NOTE — Progress Notes (Signed)
Patient ID: Douglas Corrente., male   DOB: 1951-02-09, 66 y.o.   MRN: [id number]  [employer] PROGRESS NOTE  Douglas Corrente. QIO:[id number] DOB: 1950-11-24 DOA: 09/03/2017 PCP: [name], MD  HPI/Subjective: Patient has tremor.  Objective: Vitals:   09/07/17 1359 09/07/17 1425  BP: 136/90 126/85  Pulse: 76 93  Resp: (!) 21 20  Temp:  97.8 F (36.6 C)  SpO2: 99% 99%    Filed Weights   09/07/17 0700 09/07/17 1113 09/07/17 1350  Weight: 168 lb 12.8 oz (76.6 kg) 168 lb 10.4 oz (76.5 kg) 168 lb 10.4 oz (76.5 kg)    ROS: Review of Systems  Constitutional: Negative for chills and fever.  Eyes: Negative for blurred vision.  Respiratory: Negative for cough and shortness of breath.   Cardiovascular: Negative for chest pain.  Gastrointestinal: Negative for abdominal pain, constipation, diarrhea, nausea and vomiting.  Genitourinary: Negative for dysuria.  Musculoskeletal: Negative for back pain and joint pain.  Neurological: Positive for tremors. Negative for dizziness and headaches.   Exam: Physical Exam  HENT:  Nose: No mucosal edema.  Mouth/Throat: No oropharyngeal exudate or posterior oropharyngeal edema.  Eyes: Conjunctivae, EOM and lids are normal. Pupils are equal, round, and reactive to light.  Neck: No JVD present. Carotid bruit is not present. No edema present. No thyroid mass and no thyromegaly present.  Cardiovascular: S1 normal and S2 normal. Exam reveals no gallop.  No murmur heard. Pulses:      Dorsalis pedis pulses are 2+ on the right side, and 2+ on the left side.  Respiratory: No respiratory distress. He has decreased breath sounds in the left lower field. He has no wheezes. He has no rhonchi. He has no rales.  GI: Soft. Bowel sounds are normal. There is no tenderness.  Musculoskeletal:       Right shoulder: He exhibits no swelling.  Lymphadenopathy:    He has no cervical adenopathy.  Neurological: He is alert. No cranial nerve deficit.   Skin: Skin is warm. No rash noted. Nails show no clubbing.  Bruising left knee from previous fall  Psychiatric: He has a normal mood and affect.      Data Reviewed: Basic Metabolic Panel: Recent Labs  Lab 09/03/17 1754 09/04/17 0303 09/05/17 0346 09/06/17 0333 09/07/17 0508  NA 137 137 133* 131* 133*  K 3.3* 3.7 4.1 4.0 4.1  CL 102 102 101 102 100*  CO2 24 23 24  20* 24  GLUCOSE 92 116* 107* 94 105*  BUN 38* 49* 73* 81* 81*  CREATININE 2.07* 2.57* 4.94* 7.11* 7.49*  CALCIUM 8.8* 8.5* 8.4* 8.0* 8.2*  PHOS  --   --   --   --  5.3*   CBC: Recent Labs  Lab 09/03/17 1754 09/04/17 0303 09/05/17 0346 09/06/17 0333 09/07/17 0508  WBC 26.0* 18.0* 12.5* 10.7* 9.5  HGB 14.5 13.6 13.1 11.9* 11.7*  HCT 43.8 41.1 39.3* 36.6* 34.7*  MCV 92.1 91.8 92.0 92.3 90.8  PLT 205 206 173 152 143*    Scheduled Meds: . finasteride  5 mg Oral Daily  . levothyroxine  100 mcg Oral QAC breakfast  . metoprolol succinate  25 mg Oral Daily  . multivitamin with minerals  1 tablet Oral Daily  . pneumococcal 23 valent vaccine  0.5 mL Intramuscular Tomorrow-1000  . polyethylene glycol  17 g Oral Daily  . rOPINIRole  0.5 mg Oral TID WC & HS  . sodium chloride flush  3 mL Intravenous Q12H  .  warfarin  6 mg Oral ONCE-1800  . Warfarin - Pharmacist Dosing Inpatient   Does not apply q1800   Continuous Infusions: . sodium chloride 40 mL/hr at 09/06/17 2214    Assessment/Plan:  1. Acute rhabdomyolysis.  Likely from falls and statin medication.  Stop Zocor.  CPK trending better from 39,269 to 11,408 to 5256. F/u CK tomorrow.  Continue IV fluid hydration at lower rate 2. Acute kidney injury secondary to ATN, acute rhabdomyolysis, dehydration and relative hypotension.  Creatinine worsened to 7.49 this morning.  S/p HD yesterday and today.  May need HD tomorrow per Dr. Candiss Norse.  3. BPH.  Started finasteride.  With Parkinson's Flomax is likely not a good medication for this patient.  PSA normal  range. 4. Hypothyroidism unspecified on levothyroxine.  TSH normal range. 5. Atrial fibrillation history rate control with metoprolol.  Continue Coumadin and pharmacy is dosing. 6. Physical therapy evaluation: SNF. 7. Parkinson's disease on Requip 8. Hyperlipidemia unspecified.  Stopped simvastatin due to severe rhabdomyolysis  Code Status:     Code Status Orders  (From admission, onward)        Start     Ordered   09/04/17 0042  Full code  Continuous     09/04/17 0041    Code Status History    Date Active Date Inactive Code Status Order ID Comments User Context   06/01/2017 20:14 06/03/2017 15:06 Full Code [id number]  Loletha Grayer, MD ED     Family Communication: Patient's sister. Disposition Plan: to be determined.  Consultants:  Nephrology  Time spent: 36 minutes, case discussed with Dr. Candiss Norse, RN and CM.  Demetrios Loll  [employer]

## 2017-09-07 NOTE — Progress Notes (Signed)
Called Dr. Candiss Norse concerning 40 ml/hr since he is a Renal pt and he said it was okay.

## 2017-09-07 NOTE — Progress Notes (Signed)
Pre HD  

## 2017-09-07 NOTE — Progress Notes (Signed)
Post HD assessment unchanged  

## 2017-09-07 NOTE — Progress Notes (Signed)
HD initiated via R Fem cath without issue. No heparin treatment. Patient has no complaints today

## 2017-09-07 NOTE — Progress Notes (Signed)
Patient transferred to room 217.

## 2017-09-07 NOTE — Care Management Important Message (Signed)
Important Message  Patient Details  Name: Douglas Silva. MRN: [id number] Date of Birth: 10-16-1951   Medicare Important Message Given:  Yes    Marshell Garfinkel, RN 09/07/2017, 9:28 AM

## 2017-09-07 NOTE — Progress Notes (Signed)
ANTICOAGULATION CONSULT NOTE - Follow up [place] for warfarin Indication: atrial fibrillation  No Known Allergies  Patient Measurements: Height: 5\' 11"  (180.3 cm) Weight: 168 lb 12.8 oz (76.6 kg) IBW/kg (Calculated) : 75.3   Vital Signs: Temp: 98.2 F (36.8 C) (11/13 2020) Temp Source: Oral (11/13 2020) BP: 120/76 (11/13 2020) Pulse Rate: 93 (11/13 2020)  Labs: Recent Labs    09/05/17 0346 09/06/17 0333 09/07/17 0508  HGB 13.1 11.9* 11.7*  HCT 39.3* 36.6* 34.7*  PLT 173 152 143*  LABPROT 28.3* 25.0* 22.7*  INR 2.68 2.29 2.02  CREATININE 4.94* 7.11* 7.49*  CKTOTAL  --  11,408* 5,256*    Estimated Creatinine Clearance: 10.5 mL/min (A) (by C-G formula based on SCr of 7.49 mg/dL (H)).   Medical History: Past Medical History:  Diagnosis Date  . Atrial fibrillation ([place])   . Cardiomyopathy ([place])   . Coronary artery disease   . Hypertension   . Hypothyroidism   . Parkinson's disease [facility])      Assessment: Patient admitted for fall, is chronically anticoagulated w/ warfarin for afib. CT head with no acute intracranial hemorrhage. Spoke with pt about warfarin who states he takes one whole tab daily of pink/peach color tablet (could not state mg strength so asked color of tablet); home dose listed as 5 mg daily on PTA med list.   Date INR Dose 11/11 3.05 Held 11/12 2.68 5 mg 11/13 2.29 5 mg 11/14 2.02  Goal of Therapy:  INR 2-3 Monitor platelets by anticoagulation protocol: Yes   Plan:  INR therapeutic but trending down. Will give Warfarin 6 mg po x 1. Follow up INR in AM. Daily INR and CBC at least every three days per policy.   Chinita Greenland PharmD Clinical Pharmacist 09/07/2017

## 2017-09-07 NOTE — Progress Notes (Signed)
HD completed without issue. Patient tolerated well. No UF as ordered

## 2017-09-07 NOTE — Progress Notes (Signed)
Pre hd 

## 2017-09-07 NOTE — Care Management (Signed)
RNCM met with patient to discuss transition of care and assist with discharge planning. Patient states he is from home alone- he does not drive.  He states that his PCP Dr. Brynda Greathouse is retiring but he hoped to start care with Dr. Luan Pulling in [place] which I've learned has also recently retired.  I will share with patient.

## 2017-09-07 NOTE — Progress Notes (Signed)
[place], [place] 09/07/17  Subjective:  Patient is doing fair. Tolerated 1st dialysis was on November 12. Serum creatinine Continues to remain critically high at 7.5.  BUN high at 81. CK level has improved to 200 Hemoglobin 11.7 INR is 2.02 Urinalysis shows large hemoglobin, protein greater than 300, too numerous to count RBCs   HEMODIALYSIS FLOWSHEET:  Blood Flow Rate (mL/min): 250 mL/min Arterial Pressure (mmHg): -130 mmHg Venous Pressure (mmHg): 110 mmHg Transmembrane Pressure (mmHg): 30 mmHg Ultrafiltration Rate (mL/min): 310 mL/min Dialysate Flow Rate (mL/min): 500 ml/min Conductivity: Machine : 14 Conductivity: Machine : 14 Dialysis Fluid Bolus: Normal Saline Bolus Amount (mL): 200 mL Dialysate Change: (3k 2.5ca)     Objective:  Vital signs in last 24 hours:  Temp:  [97.6 F (36.4 C)-98.4 F (36.9 C)] 97.8 F (36.6 C) (11/14 1425) Pulse Rate:  [46-93] 93 (11/14 1425) Resp:  [13-24] 20 (11/14 1425) BP: (114-137)/(62-92) 126/85 (11/14 1425) SpO2:  [96 %-100 %] 99 % (11/14 1425) Weight:  [76.5 kg (168 lb 10.4 oz)-76.6 kg (168 lb 12.8 oz)] 76.5 kg (168 lb 10.4 oz) (11/14 1350)  Weight change: 1.542 kg (3 lb 6.4 oz) Filed Weights   09/07/17 0700 09/07/17 1113 09/07/17 1350  Weight: 76.6 kg (168 lb 12.8 oz) 76.5 kg (168 lb 10.4 oz) 76.5 kg (168 lb 10.4 oz)    Intake/Output:    Intake/Output Summary (Last 24 hours) at 09/07/2017 1520 Last data filed at 09/07/2017 1350 Gross per 24 hour  Intake 1109.33 ml  Output 100 ml  Net 1009.33 ml     Physical Exam: General:  No acute distress, laying in the bed  HEENT  anicteric, moist oral mucous membranes  Neck  no distended neck veins  Pulm/lungs  coarse breath sounds at bases otherwise clear,  CVS/Heart  bradycardic, irregular  Abdomen:   Soft, nontender  Extremities:  Trace edema  Neurologic:  Alert, oriented, Parkinson's tremors  Skin:  No acute rashes  Access:  Temporary  dialysis catheter       Basic Metabolic Panel:  Recent Labs  Lab 09/03/17 1754 09/04/17 0303 09/05/17 0346 09/06/17 0333 09/07/17 0508  NA 137 137 133* 131* 133*  K 3.3* 3.7 4.1 4.0 4.1  CL 102 102 101 102 100*  CO2 24 23 24  20* 24  GLUCOSE 92 116* 107* 94 105*  BUN 38* 49* 73* 81* 81*  CREATININE 2.07* 2.57* 4.94* 7.11* 7.49*  CALCIUM 8.8* 8.5* 8.4* 8.0* 8.2*  PHOS  --   --   --   --  5.3*     CBC: Recent Labs  Lab 09/03/17 1754 09/04/17 0303 09/05/17 0346 09/06/17 0333 09/07/17 0508  WBC 26.0* 18.0* 12.5* 10.7* 9.5  HGB 14.5 13.6 13.1 11.9* 11.7*  HCT 43.8 41.1 39.3* 36.6* 34.7*  MCV 92.1 91.8 92.0 92.3 90.8  PLT 205 206 173 152 143*      Lab Results  Component Value Date   HEPBSAG Negative 09/06/2017      Microbiology:  No results found for this or any previous visit (from the past 240 hour(s)).  Coagulation Studies: Recent Labs    09/05/17 0346 09/06/17 0333 09/07/17 0508  LABPROT 28.3* 25.0* 22.7*  INR 2.68 2.29 2.02    Urinalysis: Recent Labs    09/04/17 2203  COLORURINE YELLOW*  LABSPEC 1.010  PHURINE 6.0  GLUCOSEU 50*  HGBUR LARGE*  BILIRUBINUR NEGATIVE  KETONESUR NEGATIVE  PROTEINUR >=300*  NITRITE NEGATIVE  LEUKOCYTESUR NEGATIVE  Imaging: No results found.   Medications:   . sodium chloride 40 mL/hr at 09/06/17 2214   . finasteride  5 mg Oral Daily  . levothyroxine  100 mcg Oral QAC breakfast  . metoprolol succinate  25 mg Oral Daily  . multivitamin with minerals  1 tablet Oral Daily  . pneumococcal 23 valent vaccine  0.5 mL Intramuscular Tomorrow-1000  . rOPINIRole  0.5 mg Oral TID WC & HS  . sodium chloride flush  3 mL Intravenous Q12H  . warfarin  6 mg Oral ONCE-1800  . Warfarin - Pharmacist Dosing Inpatient   Does not apply q1800   acetaminophen **OR** acetaminophen, alum & mag hydroxide-simeth, HYDROcodone-acetaminophen, ondansetron **OR** ondansetron (ZOFRAN) IV, polyethylene glycol  Assessment/  Plan:  66 y.o. Caucasian male With Parkinson's, hypertension, hypothyroidism, hyperlipidemia, atrial fibrillation requiring anticoagulation with warfarin.  He was admitted on 11/10 status post fall and was found to have  acute renal failure and rhabdomyolysis  1.  Acute renal failure.  Baseline creatinine 0.87 2.  Rhabdomyolysis 3.  Hypertension  Acute renal failure is likely secondary to ATN and rhabdomyolysis CK level has started to improve Patient seen during dialysis.  Tolerating well We'll continue to evaluate daily for dialysis but likely will need it tomorrow again Hematuria noted.?  Associated with anticoagulation   LOS: 4 [facility] 11/14/20183:20 PM  [place], [place]

## 2017-09-08 LAB — CBC
HEMATOCRIT: 37.1 % — AB (ref 40.0–52.0)
HEMOGLOBIN: 12.2 g/dL — AB (ref 13.0–18.0)
MCH: 29.9 pg (ref 26.0–34.0)
MCHC: 32.8 g/dL (ref 32.0–36.0)
MCV: 91.1 fL (ref 80.0–100.0)
Platelets: 150 10*3/uL (ref 150–440)
RBC: 4.07 MIL/uL — AB (ref 4.40–5.90)
RDW: 13.1 % (ref 11.5–14.5)
WBC: 9.2 10*3/uL (ref 3.8–10.6)

## 2017-09-08 LAB — BASIC METABOLIC PANEL
ANION GAP: 11 (ref 5–15)
BUN: 68 mg/dL — ABNORMAL HIGH (ref 6–20)
CHLORIDE: 100 mmol/L — AB (ref 101–111)
CO2: 25 mmol/L (ref 22–32)
Calcium: 8.1 mg/dL — ABNORMAL LOW (ref 8.9–10.3)
Creatinine, Ser: 6.69 mg/dL — ABNORMAL HIGH (ref 0.61–1.24)
GFR calc non Af Amer: 8 mL/min — ABNORMAL LOW (ref >60)
GFR, EST AFRICAN AMERICAN: 9 mL/min — AB (ref >60)
Glucose, Bld: 90 mg/dL (ref 65–99)
POTASSIUM: 5 mmol/L (ref 3.5–5.1)
SODIUM: 136 mmol/L (ref 135–145)

## 2017-09-08 LAB — PROTIME-INR
INR: 2.03
PROTHROMBIN TIME: 22.8 s — AB (ref 11.4–15.2)

## 2017-09-08 LAB — CK: CK TOTAL: 2701 U/L — AB (ref 49–397)

## 2017-09-08 MED ORDER — WARFARIN SODIUM 6 MG PO TABS
6.0000 mg | ORAL_TABLET | Freq: Once | ORAL | Status: AC
  Administered 2017-09-08: 6 mg via ORAL
  Filled 2017-09-08: qty 1

## 2017-09-08 NOTE — Progress Notes (Signed)
Post HD assessment  

## 2017-09-08 NOTE — Plan of Care (Addendum)
Patient had BM and urinated during the night, even with having acute kidney injury and having started dialysis.

## 2017-09-08 NOTE — Progress Notes (Signed)
PT Cancellation Note  Patient Details Name: Douglas Silva. MRN: [id number] DOB: 1951-10-18   Cancelled Treatment:    Reason Eval/Treat Not Completed: Patient at procedure or test/unavailable.  Pt currently off floor for dialysis.  Will re-attempt PT treatment session at a later date/time.  Leitha Bleak, PT 09/08/17, 9:06 AM [phone]

## 2017-09-08 NOTE — Progress Notes (Signed)
ANTICOAGULATION CONSULT NOTE - Follow up [place] for warfarin Indication: atrial fibrillation  No Known Allergies  Patient Measurements: Height: 5\' 11"  (180.3 cm) Weight: 168 lb 10.4 oz (76.5 kg) IBW/kg (Calculated) : 75.3   Vital Signs: Temp: 98.2 F (36.8 C) (11/15 0416) Temp Source: Oral (11/15 0416) BP: 127/71 (11/15 0416) Pulse Rate: 75 (11/15 0416)  Labs: Recent Labs    09/06/17 0333 09/07/17 0508 09/08/17 0425  HGB 11.9* 11.7* 12.2*  HCT 36.6* 34.7* 37.1*  PLT 152 143* 150  LABPROT 25.0* 22.7* 22.8*  INR 2.29 2.02 2.03  CREATININE 7.11* 7.49* 6.69*  CKTOTAL 11,408* 5,256* 2,701*    Estimated Creatinine Clearance: 11.7 mL/min (A) (by C-G formula based on SCr of 6.69 mg/dL (H)).   Medical History: Past Medical History:  Diagnosis Date  . Atrial fibrillation ([place])   . Cardiomyopathy ([place])   . Coronary artery disease   . Hypertension   . Hypothyroidism   . Parkinson's disease [facility])      Assessment: Patient admitted for fall, is chronically anticoagulated w/ warfarin for afib. CT head with no acute intracranial hemorrhage. Spoke with pt about warfarin who states he takes one whole tab daily of pink/peach color tablet (could not state mg strength so asked color of tablet); home dose listed as 5 mg daily on PTA med list.   Date INR Dose 11/11 3.05 Held 11/12 2.68 5 mg 11/13 2.29 5 mg 11/14 2.02 6 mg 11/15 2.03  Goal of Therapy:  INR 2-3 Monitor platelets by anticoagulation protocol: Yes   Plan:  INR therapeutic. Will give Warfarin 6 mg po x 1 again. Follow up INR in AM. Daily INR and CBC at least every three days per policy.   Chinita Greenland PharmD Clinical Pharmacist 09/08/2017

## 2017-09-08 NOTE — Progress Notes (Signed)
PRE HD Assessment 

## 2017-09-08 NOTE — Progress Notes (Signed)
HD tx end  

## 2017-09-08 NOTE — Progress Notes (Signed)
Patient ID: Douglas Corrente., male   DOB: Mar 02, 1951, 66 y.o.   MRN: [id number]  [employer] PROGRESS NOTE  Douglas Corrente. HCW:[id number] DOB: 08/23/1951 DOA: 09/03/2017 PCP: [name], MD  HPI/Subjective: Patient has tremor.  No other complaints.  Objective: Vitals:   09/08/17 1215 09/08/17 1229  BP: 122/80 123/75  Pulse: (!) 42 64  Resp: (!) 21   Temp:  98.2 F (36.8 C)  SpO2: 96% 95%    Filed Weights   09/07/17 1113 09/07/17 1350 09/08/17 0855  Weight: 168 lb 10.4 oz (76.5 kg) 168 lb 10.4 oz (76.5 kg) 169 lb 12.1 oz (77 kg)    ROS: Review of Systems  Constitutional: Negative for chills and fever.  Eyes: Negative for blurred vision.  Respiratory: Negative for cough and shortness of breath.   Cardiovascular: Negative for chest pain.  Gastrointestinal: Negative for abdominal pain, constipation, diarrhea, nausea and vomiting.  Genitourinary: Negative for dysuria.  Musculoskeletal: Negative for back pain and joint pain.  Neurological: Positive for tremors. Negative for dizziness and headaches.   Exam: Physical Exam  HENT:  Nose: No mucosal edema.  Mouth/Throat: No oropharyngeal exudate or posterior oropharyngeal edema.  Eyes: Conjunctivae, EOM and lids are normal. Pupils are equal, round, and reactive to light.  Neck: No JVD present. Carotid bruit is not present. No edema present. No thyroid mass and no thyromegaly present.  Cardiovascular: S1 normal and S2 normal. Exam reveals no gallop.  No murmur heard. Pulses:      Dorsalis pedis pulses are 2+ on the right side, and 2+ on the left side.  Respiratory: No respiratory distress. He has decreased breath sounds in the left lower field. He has no wheezes. He has no rhonchi. He has no rales.  GI: Soft. Bowel sounds are normal. There is no tenderness.  Musculoskeletal:       Right shoulder: He exhibits no swelling.  Lymphadenopathy:    He has no cervical adenopathy.  Neurological: He is alert. No  cranial nerve deficit.  Skin: Skin is warm. No rash noted. Nails show no clubbing.  Bruising left knee from previous fall  Psychiatric: He has a normal mood and affect.      Data Reviewed: Basic Metabolic Panel: Recent Labs  Lab 09/04/17 0303 09/05/17 0346 09/06/17 0333 09/07/17 0508 09/08/17 0425  NA 137 133* 131* 133* 136  K 3.7 4.1 4.0 4.1 5.0  CL 102 101 102 100* 100*  CO2 23 24 20* 24 25  GLUCOSE 116* 107* 94 105* 90  BUN 49* 73* 81* 81* 68*  CREATININE 2.57* 4.94* 7.11* 7.49* 6.69*  CALCIUM 8.5* 8.4* 8.0* 8.2* 8.1*  PHOS  --   --   --  5.3*  --    CBC: Recent Labs  Lab 09/04/17 0303 09/05/17 0346 09/06/17 0333 09/07/17 0508 09/08/17 0425  WBC 18.0* 12.5* 10.7* 9.5 9.2  HGB 13.6 13.1 11.9* 11.7* 12.2*  HCT 41.1 39.3* 36.6* 34.7* 37.1*  MCV 91.8 92.0 92.3 90.8 91.1  PLT 206 173 152 143* 150    Scheduled Meds: . finasteride  5 mg Oral Daily  . levothyroxine  100 mcg Oral QAC breakfast  . metoprolol succinate  25 mg Oral Daily  . multivitamin with minerals  1 tablet Oral Daily  . pneumococcal 23 valent vaccine  0.5 mL Intramuscular Tomorrow-1000  . polyethylene glycol  17 g Oral Daily  . rOPINIRole  0.5 mg Oral TID WC & HS  . sodium chloride flush  3 mL Intravenous Q12H  . warfarin  6 mg Oral ONCE-1800  . Warfarin - Pharmacist Dosing Inpatient   Does not apply q1800   Continuous Infusions: . sodium chloride 40 mL/hr at 09/08/17 0751    Assessment/Plan:  1. Acute rhabdomyolysis.  Likely from falls and statin medication.  Stop Zocor.  CPK trending better from 39,269 to 11,408 to 5256-2701. F/u CK tomorrow.  Continue IV fluid hydration at lower rate Acute kidney injury secondary to ATN, acute rhabdomyolysis, dehydration and relative hypotension.  Creatinine worsened to 7.49 this morning.  S/p HD 3 days.  May need continue to evaluate daily for need of further dialysis  per Dr. Candiss Norse.  2. BPH.  Started finasteride.  With Parkinson's Flomax is likely not a  good medication for this patient.  PSA normal range. 3. Hypothyroidism unspecified on levothyroxine.  TSH normal range. 4. Atrial fibrillation history rate control with metoprolol.  Continue Coumadin and pharmacy is dosing. 5. Physical therapy evaluation: SNF. 6. Parkinson's disease on Requip 7. Hyperlipidemia unspecified.  Stopped simvastatin due to severe rhabdomyolysis  Code Status:     Code Status Orders  (From admission, onward)        Start     Ordered   09/04/17 0042  Full code  Continuous     09/04/17 0041    Code Status History    Date Active Date Inactive Code Status Order ID Comments User Context   06/01/2017 20:14 06/03/2017 15:06 Full Code [id number]  Loletha Grayer, MD ED     Family Communication: Patient's sister. Disposition Plan: to be determined.  Consultants:  Nephrology  Time spent: 33 minutes, case discussed with Dr. Candiss Norse, RN and CM.  Demetrios Loll  [employer]

## 2017-09-08 NOTE — Progress Notes (Signed)
PT Cancellation Note  Patient Details Name: Douglas Silva. MRN: [id number] DOB: 27-Nov-1950   Cancelled Treatment:    Reason Eval/Treat Not Completed: Patient declined, no reason specified;Other (comment). Treatment attempted; pt refused noting he has a massive headache and he is exhausted from staff being in and out of his room all day. Pt requests that therapy try and see him in the morning before dialysis if possible. Re attempt as the schedule allows according to pt request.    Larae Grooms, PTA 09/08/2017, 4:56 PM

## 2017-09-08 NOTE — Progress Notes (Signed)
HD TX started  

## 2017-09-08 NOTE — Progress Notes (Signed)
[place], [place] 09/08/17  Subjective:  Patient is doing fair. Tolerated 1st dialysis was on November 12. Serum creatinine Continues to remain critically high at 6.7.  BUN high at 68. CK level has improved to 2700 Hemoglobin 12.2 INR is 2.03 Urinalysis shows large hemoglobin, protein greater than 300, too numerous to count RBCs   HEMODIALYSIS FLOWSHEET:  Blood Flow Rate (mL/min): 300 mL/min Arterial Pressure (mmHg): -190 mmHg Venous Pressure (mmHg): 260 mmHg Transmembrane Pressure (mmHg): 40 mmHg Ultrafiltration Rate (mL/min): 170 mL/min Dialysate Flow Rate (mL/min): 600 ml/min Conductivity: Machine : 13.7 Conductivity: Machine : 13.7 Dialysis Fluid Bolus: Normal Saline Bolus Amount (mL): 250 mL Dialysate Change: (3k 2.5ca)     Objective:  Vital signs in last 24 hours:  Temp:  [97.6 F (36.4 C)-98.2 F (36.8 C)] 98.2 F (36.8 C) (11/15 1229) Pulse Rate:  [39-93] 64 (11/15 1229) Resp:  [15-26] 21 (11/15 1215) BP: (116-140)/(71-97) 123/75 (11/15 1229) SpO2:  [95 %-100 %] 95 % (11/15 1229) Weight:  [76.5 kg (168 lb 10.4 oz)-77 kg (169 lb 12.1 oz)] 77 kg (169 lb 12.1 oz) (11/15 0855)  Weight change: -5.102 kg (-4 oz) Filed Weights   09/07/17 1113 09/07/17 1350 09/08/17 0855  Weight: 76.5 kg (168 lb 10.4 oz) 76.5 kg (168 lb 10.4 oz) 77 kg (169 lb 12.1 oz)    Intake/Output:    Intake/Output Summary (Last 24 hours) at 09/08/2017 1257 Last data filed at 09/08/2017 1215 Gross per 24 hour  Intake 713 ml  Output 612 ml  Net 101 ml     Physical Exam: General:  No acute distress, laying in the bed  HEENT  anicteric, moist oral mucous membranes  Neck  no distended neck veins  Pulm/lungs  coarse breath sounds at bases otherwise clear,  CVS/Heart  bradycardic, irregular  Abdomen:   Soft, nontender  Extremities:  Trace edema  Neurologic:  Alert, oriented, Parkinson's tremors  Skin:  No acute rashes  Access:  Temporary dialysis  catheter       Basic Metabolic Panel:  Recent Labs  Lab 09/04/17 0303 09/05/17 0346 09/06/17 0333 09/07/17 0508 09/08/17 0425  NA 137 133* 131* 133* 136  K 3.7 4.1 4.0 4.1 5.0  CL 102 101 102 100* 100*  CO2 23 24 20* 24 25  GLUCOSE 116* 107* 94 105* 90  BUN 49* 73* 81* 81* 68*  CREATININE 2.57* 4.94* 7.11* 7.49* 6.69*  CALCIUM 8.5* 8.4* 8.0* 8.2* 8.1*  PHOS  --   --   --  5.3*  --      CBC: Recent Labs  Lab 09/04/17 0303 09/05/17 0346 09/06/17 0333 09/07/17 0508 09/08/17 0425  WBC 18.0* 12.5* 10.7* 9.5 9.2  HGB 13.6 13.1 11.9* 11.7* 12.2*  HCT 41.1 39.3* 36.6* 34.7* 37.1*  MCV 91.8 92.0 92.3 90.8 91.1  PLT 206 173 152 143* 150      Lab Results  Component Value Date   HEPBSAG Negative 09/06/2017      Microbiology:  No results found for this or any previous visit (from the past 240 hour(s)).  Coagulation Studies: Recent Labs    09/06/17 0333 09/07/17 0508 09/08/17 0425  LABPROT 25.0* 22.7* 22.8*  INR 2.29 2.02 2.03    Urinalysis: No results for input(s): COLORURINE, LABSPEC, PHURINE, GLUCOSEU, HGBUR, BILIRUBINUR, KETONESUR, PROTEINUR, UROBILINOGEN, NITRITE, LEUKOCYTESUR in the last 72 hours.  Invalid input(s): APPERANCEUR    Imaging: No results found.   Medications:   . sodium chloride 40 mL/hr at  09/08/17 0751   . finasteride  5 mg Oral Daily  . levothyroxine  100 mcg Oral QAC breakfast  . metoprolol succinate  25 mg Oral Daily  . multivitamin with minerals  1 tablet Oral Daily  . pneumococcal 23 valent vaccine  0.5 mL Intramuscular Tomorrow-1000  . polyethylene glycol  17 g Oral Daily  . rOPINIRole  0.5 mg Oral TID WC & HS  . sodium chloride flush  3 mL Intravenous Q12H  . warfarin  6 mg Oral ONCE-1800  . Warfarin - Pharmacist Dosing Inpatient   Does not apply q1800   acetaminophen **OR** acetaminophen, alum & mag hydroxide-simeth, HYDROcodone-acetaminophen, ondansetron **OR** ondansetron (ZOFRAN) IV  Assessment/ Plan:  66 y.o.  Caucasian male With Parkinson's, hypertension, hypothyroidism, hyperlipidemia, atrial fibrillation requiring anticoagulation with warfarin.  He was admitted on 11/10 status post fall and was found to have  acute renal failure and rhabdomyolysis  1.  Acute renal failure.  Baseline creatinine 0.87 2.  Rhabdomyolysis 3.  Hematuria  Acute renal failure is likely secondary to ATN and rhabdomyolysis. UOP 400 cc, starting to increase CK levels are improving Patient seen during dialysis.  Tolerating well We'll continue to evaluate daily for need of further dialysis   Hematuria noted.?  Associated with anticoagulation. repeat u/a   LOS: 5 Rollyn Scialdone 11/15/201812:57 PM  [facility] [place], [place]

## 2017-09-08 NOTE — Progress Notes (Signed)
PRE HD TX 

## 2017-09-09 LAB — BASIC METABOLIC PANEL
Anion gap: 11 (ref 5–15)
BUN: 52 mg/dL — ABNORMAL HIGH (ref 6–20)
CO2: 26 mmol/L (ref 22–32)
Calcium: 7.7 mg/dL — ABNORMAL LOW (ref 8.9–10.3)
Chloride: 96 mmol/L — ABNORMAL LOW (ref 101–111)
Creatinine, Ser: 6.27 mg/dL — ABNORMAL HIGH (ref 0.61–1.24)
GFR calc non Af Amer: 8 mL/min — ABNORMAL LOW (ref >60)
GFR, EST AFRICAN AMERICAN: 10 mL/min — AB (ref >60)
GLUCOSE: 91 mg/dL (ref 65–99)
Potassium: 4.6 mmol/L (ref 3.5–5.1)
SODIUM: 133 mmol/L — AB (ref 135–145)

## 2017-09-09 LAB — URINALYSIS, ROUTINE W REFLEX MICROSCOPIC
Bilirubin Urine: NEGATIVE
Glucose, UA: 50 mg/dL — AB
KETONES UR: NEGATIVE mg/dL
LEUKOCYTES UA: NEGATIVE
Nitrite: NEGATIVE
PROTEIN: 100 mg/dL — AB
Specific Gravity, Urine: 1.004 — ABNORMAL LOW (ref 1.005–1.030)
pH: 9 — ABNORMAL HIGH (ref 5.0–8.0)

## 2017-09-09 LAB — CBC
HCT: 35.2 % — ABNORMAL LOW (ref 40.0–52.0)
Hemoglobin: 11.8 g/dL — ABNORMAL LOW (ref 13.0–18.0)
MCH: 30.6 pg (ref 26.0–34.0)
MCHC: 33.5 g/dL (ref 32.0–36.0)
MCV: 91.4 fL (ref 80.0–100.0)
PLATELETS: 134 10*3/uL — AB (ref 150–440)
RBC: 3.85 MIL/uL — ABNORMAL LOW (ref 4.40–5.90)
RDW: 13.3 % (ref 11.5–14.5)
WBC: 9.9 10*3/uL (ref 3.8–10.6)

## 2017-09-09 LAB — CK: Total CK: 1256 U/L — ABNORMAL HIGH (ref 49–397)

## 2017-09-09 LAB — PROTIME-INR
INR: 2.44
Prothrombin Time: 26.3 seconds — ABNORMAL HIGH (ref 11.4–15.2)

## 2017-09-09 MED ORDER — CAMPHOR-PHENOL 10.8-4.7 % EX GEL
1.0000 "application " | Freq: Three times a day (TID) | CUTANEOUS | Status: DC | PRN
  Administered 2017-09-09 – 2017-09-10 (×3): 1 via CUTANEOUS
  Filled 2017-09-09: qty 1

## 2017-09-09 MED ORDER — WARFARIN SODIUM 4 MG PO TABS
4.0000 mg | ORAL_TABLET | Freq: Once | ORAL | Status: AC
  Administered 2017-09-09: 4 mg via ORAL
  Filled 2017-09-09: qty 1

## 2017-09-09 NOTE — Care Management Important Message (Signed)
Important Message  Patient Details  Name: Douglas Silva. MRN: [id number] Date of Birth: 05/17/1951   Medicare Important Message Given:  Yes    Beverly Sessions, RN 09/09/2017, 3:58 PM

## 2017-09-09 NOTE — Progress Notes (Addendum)
Patient ID: Douglas Corrente., male   DOB: 24-Apr-1951, 66 y.o.   MRN: [id number]  [facility] PROGRESS NOTE  Douglas Corrente. PYK:[id number] DOB: 14-Oct-1951 DOA: 09/03/2017 PCP: [name], MD  HPI/Subjective: Patient has no complaints. On HD.  more urine output.  Objective: Vitals:   09/09/17 1352 09/09/17 1426  BP:  140/86  Pulse: 76 84  Resp: (!) 24   Temp:  (!) 97.5 F (36.4 C)  SpO2:  97%    Filed Weights   09/07/17 1350 09/08/17 0855 09/09/17 0538  Weight: 168 lb 10.4 oz (76.5 kg) 169 lb 12.1 oz (77 kg) 184 lb (83.5 kg)    ROS: Review of Systems  Constitutional: Negative for chills and fever.  Eyes: Negative for blurred vision.  Respiratory: Negative for cough and shortness of breath.   Cardiovascular: Negative for chest pain.  Gastrointestinal: Negative for abdominal pain, constipation, diarrhea, nausea and vomiting.  Genitourinary: Negative for dysuria.  Musculoskeletal: Negative for back pain and joint pain.  Neurological: Positive for tremors. Negative for dizziness and headaches.   Exam: Physical Exam  HENT:  Nose: No mucosal edema.  Mouth/Throat: No oropharyngeal exudate or posterior oropharyngeal edema.  Eyes: Conjunctivae, EOM and lids are normal. Pupils are equal, round, and reactive to light.  Neck: No JVD present. Carotid bruit is not present. No edema present. No thyroid mass and no thyromegaly present.  Cardiovascular: S1 normal and S2 normal. Exam reveals no gallop.  No murmur heard. Pulses:      Dorsalis pedis pulses are 2+ on the right side, and 2+ on the left side.  Respiratory: No respiratory distress. He has decreased breath sounds in the left lower field. He has no wheezes. He has no rhonchi. He has no rales.  GI: Soft. Bowel sounds are normal. There is no tenderness.  Musculoskeletal:       Right shoulder: He exhibits no swelling.  Lymphadenopathy:    He has no cervical adenopathy.  Neurological: He is alert. No cranial  nerve deficit.  Skin: Skin is warm. No rash noted. Nails show no clubbing.  Bruising left knee from previous fall  Psychiatric: He has a normal mood and affect.      Data Reviewed: Basic Metabolic Panel: Recent Labs  Lab 09/05/17 0346 09/06/17 0333 09/07/17 0508 09/08/17 0425 09/09/17 0339  NA 133* 131* 133* 136 133*  K 4.1 4.0 4.1 5.0 4.6  CL 101 102 100* 100* 96*  CO2 24 20* 24 25 26   GLUCOSE 107* 94 105* 90 91  BUN 73* 81* 81* 68* 52*  CREATININE 4.94* 7.11* 7.49* 6.69* 6.27*  CALCIUM 8.4* 8.0* 8.2* 8.1* 7.7*  PHOS  --   --  5.3*  --   --    CBC: Recent Labs  Lab 09/05/17 0346 09/06/17 0333 09/07/17 0508 09/08/17 0425 09/09/17 0339  WBC 12.5* 10.7* 9.5 9.2 9.9  HGB 13.1 11.9* 11.7* 12.2* 11.8*  HCT 39.3* 36.6* 34.7* 37.1* 35.2*  MCV 92.0 92.3 90.8 91.1 91.4  PLT 173 152 143* 150 134*    Scheduled Meds: . finasteride  5 mg Oral Daily  . levothyroxine  100 mcg Oral QAC breakfast  . metoprolol succinate  25 mg Oral Daily  . multivitamin with minerals  1 tablet Oral Daily  . pneumococcal 23 valent vaccine  0.5 mL Intramuscular Tomorrow-1000  . polyethylene glycol  17 g Oral Daily  . rOPINIRole  0.5 mg Oral TID WC & HS  . sodium chloride  flush  3 mL Intravenous Q12H  . warfarin  4 mg Oral ONCE-1800  . Warfarin - Pharmacist Dosing Inpatient   Does not apply q1800   Continuous Infusions: . sodium chloride 40 mL/hr at 09/09/17 1431    Assessment/Plan:  1. Acute rhabdomyolysis.  Likely from falls and statin medication.  Stop Zocor.  CPK trending better from 39,269 to 11,408 to [phone]. F/u CK tomorrow.  Continue IV fluid hydration at lower rate Acute kidney injury secondary to ATN, acute rhabdomyolysis, dehydration and relative hypotension.  Creatinine is better.  S/p HD 3 days.  HD today per Dr. Candiss Norse.  2. BPH.  Started finasteride.  With Parkinson's Flomax is likely not a good medication for this patient.  PSA normal range. 3. Hypothyroidism  unspecified on levothyroxine.  TSH normal range. 4. Atrial fibrillation history rate control with metoprolol.  Continue Coumadin and pharmacy is dosing. 5. Physical therapy evaluation: SNF. 6. Parkinson's disease on Requip 7. Hyperlipidemia unspecified.  Stopped simvastatin due to severe rhabdomyolysis  I discussed with Dr. Candiss Norse.  Code Status:     Code Status Orders  (From admission, onward)        Start     Ordered   09/04/17 0042  Full code  Continuous     09/04/17 0041    Code Status History    Date Active Date Inactive Code Status Order ID Comments User Context   06/01/2017 20:14 06/03/2017 15:06 Full Code [id number]  Loletha Grayer, MD ED     Family Communication: Patient's sister. Disposition Plan: to be determined.  Consultants:  Nephrology  Time spent: 33 minutes, case discussed with Dr. Candiss Norse, RN and CM.  Demetrios Loll  [employer]

## 2017-09-09 NOTE — Progress Notes (Signed)
This note also relates to the following rows which could not be included: Pulse Rate - Cannot attach notes to unvalidated device data Resp - Cannot attach notes to unvalidated device data BP - Cannot attach notes to unvalidated device data  Hd started  

## 2017-09-09 NOTE — Progress Notes (Signed)
This note also relates to the following rows which could not be included: Pulse Rate - Cannot attach notes to unvalidated device data Resp - Cannot attach notes to unvalidated device data BP - Cannot attach notes to unvalidated device data  HD COMPLETED  

## 2017-09-09 NOTE — Progress Notes (Signed)
Pre dialysis assessment 

## 2017-09-09 NOTE — Progress Notes (Signed)
ANTICOAGULATION CONSULT NOTE - Follow up [place] for warfarin Indication: atrial fibrillation  No Known Allergies  Patient Measurements: Height: 5\' 11"  (180.3 cm) Weight: 184 lb (83.5 kg) IBW/kg (Calculated) : 75.3   Vital Signs: Temp: 97.4 F (36.3 C) (11/16 0538) Temp Source: Oral (11/16 0538) BP: 135/95 (11/16 0538) Pulse Rate: 87 (11/16 0538)  Labs: Recent Labs    09/07/17 0508 09/08/17 0425 09/09/17 0339  HGB 11.7* 12.2* 11.8*  HCT 34.7* 37.1* 35.2*  PLT 143* 150 134*  LABPROT 22.7* 22.8* 26.3*  INR 2.02 2.03 2.44  CREATININE 7.49* 6.69* 6.27*  CKTOTAL 5,256* 2,701* 1,256*    Estimated Creatinine Clearance: 12.5 mL/min (A) (by C-G formula based on SCr of 6.27 mg/dL (H)).   Medical History: Past Medical History:  Diagnosis Date  . Atrial fibrillation ([place])   . Cardiomyopathy ([place])   . Coronary artery disease   . Hypertension   . Hypothyroidism   . Parkinson's disease [facility])      Assessment: Patient admitted for fall, is chronically anticoagulated w/ warfarin for afib. CT head with no acute intracranial hemorrhage. Spoke with pt about warfarin who states he takes one whole tab daily of pink/peach color tablet (could not state mg strength so asked color of tablet); home dose listed as 5 mg daily on PTA med list.   Date INR Dose 11/11 3.05 Held 11/12 2.68 5 mg 11/13 2.29 5 mg 11/14 2.02 6 mg 11/15 2.03 6 mg 11/16 2.44   Goal of Therapy:  INR 2-3 Monitor platelets by anticoagulation protocol: Yes   Plan:  INR therapeutic with significant increase. Will give Warfarin 4 mg PO x 1 tonight. Follow up INR in AM. Daily INR and CBC at least every three days per policy - Hgb stable past few days  Rayna Sexton, PharmD, BCPS Clinical Pharmacist 09/09/2017 10:11 AM

## 2017-09-09 NOTE — Clinical Social Work Note (Signed)
Patient's dialysis has not yet been determined as whether it will be acute or chronic.Thus, patient will remain here through the weekend. Once determined, patient to discharge to [facility]. Shela Leff MSW, [place]

## 2017-09-09 NOTE — Progress Notes (Signed)
Physical Therapy Treatment Patient Details Name: Douglas Silva. MRN: [id number] DOB: 01/14/1951 Today's Date: 09/09/2017    History of Present Illness Pt is a 66 y.o. male presenting to hospital s/p fall with back pain (pt walking with walker and lost footing d/t chronic movement issues with Parkinson's disease).  09/05/17 temporary R femoral HD catheter placed d/t acute kidney injury.  PMH includes a-fib, Parkinson's disease, htn, CAD.    PT Comments    D/t R temporary femoral HD catheter, (per hospital protocol) not able to perform OOB mobility.  Pt tolerated allowable in bed UE and LE ex's well.  Will continue to focus on strengthening/ex's in bed until temporary femoral HD catheter is removed and pt is cleared to get OOB.   Follow Up Recommendations  SNF     Equipment Recommendations  Rolling walker with 5" wheels    Recommendations for Other Services OT consult     Precautions / Restrictions Precautions Precautions: Fall Precaution Comments: R temporary femoral HD catheter restrictions;  Aspiration Restrictions Weight Bearing Restrictions: No    Mobility  Bed Mobility               General bed mobility comments: Deferred d/t temporary R femoral HD catheter restrictions  Transfers                 General transfer comment: Deferred d/t temporary R femoral HD catheter restrictions  Ambulation/Gait             General Gait Details: Deferred d/t temporary R femoral HD catheter restrictions   Stairs            Wheelchair Mobility    Modified Rankin (Stroke Patients Only)       Balance                                            Cognition Arousal/Alertness: Awake/alert Behavior During Therapy: Flat affect Overall Cognitive Status: Within Functional Limits for tasks assessed                                        Exercises Total Joint Exercises Ankle Circles/Pumps:  AROM;Strengthening;Both;Supine(10 reps x2) Quad Sets: AROM;Strengthening;Both;Supine(10 reps x2; vc's and tactile cues required for exercise) Short Arc Quad: AROM;Strengthening;Left;Supine(10 reps x2) Heel Slides: AROM;Strengthening;Left;Supine(10 reps x2) Hip ABduction/ADduction: AAROM;AROM;Strengthening;Left;Supine(10 reps x2) General Exercises - Upper Extremity Shoulder Flexion: AROM;Strengthening;Both;Supine(10 reps x2) Shoulder ABduction: AROM;Strengthening;Both;Supine(10 reps x2) Elbow Flexion: AROM;Strengthening;Both;10 reps;Supine Elbow Extension: AROM;Strengthening;Both;10 reps;Supine Other Exercises Other Exercises: B hand grip squeezes 10 reps x2 (supine in bed)    General Comments General comments (skin integrity, edema, etc.): R temporary femoral HD catheter in place.  Nursing cleared pt for participation in physical therapy.  Pt agreeable to PT session.      Pertinent Vitals/Pain Pain Assessment: No/denies pain  Vitals (HR and O2 on room air) stable and WFL throughout treatment session.    Home Living                      Prior Function            PT Goals (current goals can now be found in the care plan section) Acute Rehab PT Goals Patient Stated Goal: to be able to walk again PT Goal Formulation:  With patient Time For Goal Achievement: 09/18/17 Potential to Achieve Goals: Fair Progress towards PT goals: Progressing toward goals    Frequency    Min 2X/week      PT Plan Current plan remains appropriate    Co-evaluation              AM-PAC PT "6 Clicks" Daily Activity  Outcome Measure  Difficulty turning over in bed (including adjusting bedclothes, sheets and blankets)?: Unable Difficulty moving from lying on back to sitting on the side of the bed? : Unable Difficulty sitting down on and standing up from a chair with arms (e.g., wheelchair, bedside commode, etc,.)?: Unable Help needed moving to and from a bed to chair (including a  wheelchair)?: A Lot Help needed walking in hospital room?: Total Help needed climbing 3-5 steps with a railing? : Total 6 Click Score: 7    End of Session   Activity Tolerance: Patient tolerated treatment well Patient left: in bed;with call bell/phone within reach;with bed alarm set Nurse Communication: Mobility status;Precautions;Other (comment)(Pt reporting acid reflux and requesting something for it) PT Visit Diagnosis: Other abnormalities of gait and mobility (R26.89);Repeated falls (R29.6);Muscle weakness (generalized) (M62.81)     Time: 5277-8242 PT Time Calculation (min) (ACUTE ONLY): 25 min  Charges:  $Therapeutic Exercise: 23-37 mins                    G CodesLeitha Bleak, PT 09/09/17, 9:38 AM [phone]

## 2017-09-09 NOTE — Progress Notes (Signed)
POST DIALYSIS ASSESSMENT 

## 2017-09-10 LAB — PROTIME-INR
INR: 3.12
Prothrombin Time: 31.9 seconds — ABNORMAL HIGH (ref 11.4–15.2)

## 2017-09-10 LAB — BASIC METABOLIC PANEL
Anion gap: 8 (ref 5–15)
BUN: 38 mg/dL — AB (ref 6–20)
CALCIUM: 7.9 mg/dL — AB (ref 8.9–10.3)
CO2: 29 mmol/L (ref 22–32)
Chloride: 99 mmol/L — ABNORMAL LOW (ref 101–111)
Creatinine, Ser: 5.29 mg/dL — ABNORMAL HIGH (ref 0.61–1.24)
GFR calc Af Amer: 12 mL/min — ABNORMAL LOW (ref >60)
GFR, EST NON AFRICAN AMERICAN: 10 mL/min — AB (ref >60)
GLUCOSE: 93 mg/dL (ref 65–99)
Potassium: 4.7 mmol/L (ref 3.5–5.1)
Sodium: 136 mmol/L (ref 135–145)

## 2017-09-10 LAB — CK: CK TOTAL: 708 U/L — AB (ref 49–397)

## 2017-09-10 MED ORDER — WARFARIN SODIUM 3 MG PO TABS
3.0000 mg | ORAL_TABLET | Freq: Once | ORAL | Status: AC
  Administered 2017-09-10: 3 mg via ORAL
  Filled 2017-09-10: qty 1

## 2017-09-10 NOTE — Progress Notes (Signed)
Hd start 

## 2017-09-10 NOTE — Progress Notes (Signed)
Patient ID: Douglas Silva., male   DOB: 1951-10-20, 66 y.o.   MRN: [id number]  [facility] PROGRESS NOTE  Douglas Silva. [id number]:[id number] DOB: 10-09-1951 DOA: 09/03/2017 PCP: [name], MD  HPI/Subjective: Patient feeling much better will be receiving hemodialysis again today  Objective: Vitals:   09/09/17 2002 09/10/17 0458  BP: 127/77 130/89  Pulse: 92 81  Resp: 20 (!) 22  Temp: 98.6 F (37 C) 97.9 F (36.6 C)  SpO2: 98% 97%    Filed Weights   09/07/17 1350 09/08/17 0855 09/09/17 0538  Weight: 168 lb 10.4 oz (76.5 kg) 169 lb 12.1 oz (77 kg) 184 lb (83.5 kg)    ROS: Review of Systems  Constitutional: Negative for chills and fever.  Eyes: Negative for blurred vision.  Respiratory: Negative for cough and shortness of breath.   Cardiovascular: Negative for chest pain.  Gastrointestinal: Negative for abdominal pain, constipation, diarrhea, nausea and vomiting.  Genitourinary: Negative for dysuria.  Musculoskeletal: Negative for back pain and joint pain.  Neurological: Positive for tremors. Negative for dizziness and headaches.   Exam: Physical Exam  HENT:  Nose: No mucosal edema.  Mouth/Throat: No oropharyngeal exudate or posterior oropharyngeal edema.  Eyes: Conjunctivae, EOM and lids are normal. Pupils are equal, round, and reactive to light.  Neck: No JVD present. Carotid bruit is not present. No edema present. No thyroid mass and no thyromegaly present.  Cardiovascular: S1 normal and S2 normal. Exam reveals no gallop.  No murmur heard. Pulses:      Dorsalis pedis pulses are 2+ on the right side, and 2+ on the left side.  Respiratory: No respiratory distress. He has decreased breath sounds in the left lower field. He has no wheezes. He has no rhonchi. He has no rales.  GI: Soft. Bowel sounds are normal. There is no tenderness.  Musculoskeletal:       Right shoulder: He exhibits no swelling.  Lymphadenopathy:    He has no cervical adenopathy.   Neurological: He is alert. No cranial nerve deficit.  Skin: Skin is warm. No rash noted. Nails show no clubbing.  Bruising left knee from previous fall  Psychiatric: He has a normal mood and affect.      Data Reviewed: Basic Metabolic Panel: Recent Labs  Lab 09/06/17 0333 09/07/17 0508 09/08/17 0425 09/09/17 0339 09/10/17 0405  NA 131* 133* 136 133* 136  K 4.0 4.1 5.0 4.6 4.7  CL 102 100* 100* 96* 99*  CO2 20* 24 25 26 29   GLUCOSE 94 105* 90 91 93  BUN 81* 81* 68* 52* 38*  CREATININE 7.11* 7.49* 6.69* 6.27* 5.29*  CALCIUM 8.0* 8.2* 8.1* 7.7* 7.9*  PHOS  --  5.3*  --   --   --    CBC: Recent Labs  Lab 09/05/17 0346 09/06/17 0333 09/07/17 0508 09/08/17 0425 09/09/17 0339  WBC 12.5* 10.7* 9.5 9.2 9.9  HGB 13.1 11.9* 11.7* 12.2* 11.8*  HCT 39.3* 36.6* 34.7* 37.1* 35.2*  MCV 92.0 92.3 90.8 91.1 91.4  PLT 173 152 143* 150 134*    Scheduled Meds: . finasteride  5 mg Oral Daily  . levothyroxine  100 mcg Oral QAC breakfast  . metoprolol succinate  25 mg Oral Daily  . multivitamin with minerals  1 tablet Oral Daily  . pneumococcal 23 valent vaccine  0.5 mL Intramuscular Tomorrow-1000  . polyethylene glycol  17 g Oral Daily  . rOPINIRole  0.5 mg Oral TID WC & HS  .  sodium chloride flush  3 mL Intravenous Q12H  . warfarin  3 mg Oral ONCE-1800  . Warfarin - Pharmacist Dosing Inpatient   Does not apply q1800   Continuous Infusions: . sodium chloride 40 mL/hr at 09/09/17 1431    Assessment/Plan:  1. Acute kidney injury felt to be due to ATN, hemodialysis again today they will hold hemodialysis on "Sunday 2. Acute rhabdomyolysis.  Likely from falls and statin medication.   Continue IV fluid hydration at lower rate CPK trending down 3. BPH.  Continue finasteride.  With Parkinson's Flomax is likely not a good medication for this patient.  PSA normal range.  Patient is noted to have some hematuria renal ultrasound shows no pathology likely related to chronic  anticoagulation will need outpatient urology follow-up 4. Hypothyroidism unspecified on levothyroxine.  TSH normal range. 5. Atrial fibrillation history rate control with metoprolol.  Continue Coumadin and pharmacy is dosing. 6. Physical therapy evaluation: SNF. 7. Parkinson's disease on Requip 8. Hyperlipidemia unspecified.  Stopped simvastatin due to severe rhabdomyolysis    Code Status:     Code Status Orders  (From admission, onward)        Start     Ordered   09/04/17 0042  Full code  Continuous     11" /11/18 0041    Code Status History    Date Active Date Inactive Code Status Order ID Comments User Context   06/01/2017 20:14 06/03/2017 15:06 Full Code [id number]  Loletha Grayer, MD ED     Family Communication: Patient's sister. Disposition Plan: to be determined.  Consultants:  Nephrology  Time spent: 33 minutes, case discussed with Dr. Candiss Norse, RN and CM.  Posey Pronto [facility]

## 2017-09-10 NOTE — Progress Notes (Signed)
ANTICOAGULATION CONSULT NOTE - Follow up [place] for warfarin Indication: atrial fibrillation  No Known Allergies  Patient Measurements: Height: 5\' 11"  (180.3 cm) Weight: 184 lb (83.5 kg) IBW/kg (Calculated) : 75.3   Vital Signs: Temp: 97.9 F (36.6 C) (11/17 0458) Temp Source: Oral (11/17 0458) BP: 130/89 (11/17 0458) Pulse Rate: 81 (11/17 0458)  Labs: Recent Labs    09/08/17 0425 09/09/17 0339 09/10/17 0405  HGB 12.2* 11.8*  --   HCT 37.1* 35.2*  --   PLT 150 134*  --   LABPROT 22.8* 26.3* 31.9*  INR 2.03 2.44 3.12  CREATININE 6.69* 6.27* 5.29*  CKTOTAL 2,701* 1,256* 708*    Estimated Creatinine Clearance: 14.8 mL/min (A) (by C-G formula based on SCr of 5.29 mg/dL (H)).   Medical History: Past Medical History:  Diagnosis Date  . Atrial fibrillation ([place])   . Cardiomyopathy ([place])   . Coronary artery disease   . Hypertension   . Hypothyroidism   . Parkinson's disease [facility])      Assessment: Patient admitted for fall, is chronically anticoagulated w/ warfarin for afib. CT head with no acute intracranial hemorrhage. Spoke with pt about warfarin who states he takes one whole tab daily of pink/peach color tablet (could not state mg strength so asked color of tablet); home dose listed as 5 mg daily on PTA med list.   Date INR Dose 11/11 3.05 Held 11/12 2.68 5 mg 11/13 2.29 5 mg 11/14 2.02 6 mg 11/15 2.03 6 mg 11/16 2.44 4 mg 11/17 3.12  Goal of Therapy:  INR 2-3 Monitor platelets by anticoagulation protocol: Yes   Plan:  INR slightly supratherapeutic (representing 6 mg dose). Will give Warfarin 3 mg PO x 1 tonight as anticipate INR will trend back down Follow up INR in AM. Daily INR and CBC at least every three days per policy - Hgb stable past few days  Chinita Greenland PharmD Clinical Pharmacist 09/10/2017

## 2017-09-10 NOTE — Progress Notes (Signed)
Pre hd info 

## 2017-09-10 NOTE — Progress Notes (Signed)
[place], [place] 09/10/17  Subjective:  Patient is doing fair. Tolerated 1st dialysis was on November 12. Serum creatinine Continues to remain critically high at 5.3.  BUN high at 38. CK level has improved to 708 Hemoglobin 11.82 INR is 3.12 Urinalysis shows large hemoglobin, protein greater than 300, too numerous to count RBCs     Objective:  Vital signs in last 24 hours:  Temp:  [97.5 F (36.4 C)-98.6 F (37 C)] 97.9 F (36.6 C) (11/17 0458) Pulse Rate:  [70-92] 81 (11/17 0458) Resp:  [17-28] 22 (11/17 0458) BP: (124-141)/(77-106) 130/89 (11/17 0458) SpO2:  [97 %-98 %] 97 % (11/17 0458)  Weight change:  Filed Weights   09/07/17 1350 09/08/17 0855 09/09/17 0538  Weight: 76.5 kg (168 lb 10.4 oz) 77 kg (169 lb 12.1 oz) 83.5 kg (184 lb)    Intake/Output:    Intake/Output Summary (Last 24 hours) at 09/10/2017 1042 Last data filed at 09/10/2017 0220 Gross per 24 hour  Intake 755 ml  Output 0 ml  Net 755 ml     Physical Exam: General:  No acute distress, laying in the bed  HEENT  anicteric, moist oral mucous membranes  Neck  no distended neck veins  Pulm/lungs  coarse breath sounds at bases otherwise clear,  CVS/Heart  bradycardic, irregular  Abdomen:   Soft, nontender  Extremities:  Trace edema  Neurologic:  Alert, oriented, Parkinson's tremors  Skin:  No acute rashes  Access:  Temporary dialysis catheter       Basic Metabolic Panel:  Recent Labs  Lab 09/06/17 0333 09/07/17 0508 09/08/17 0425 09/09/17 0339 09/10/17 0405  NA 131* 133* 136 133* 136  K 4.0 4.1 5.0 4.6 4.7  CL 102 100* 100* 96* 99*  CO2 20* 24 25 26 29   GLUCOSE 94 105* 90 91 93  BUN 81* 81* 68* 52* 38*  CREATININE 7.11* 7.49* 6.69* 6.27* 5.29*  CALCIUM 8.0* 8.2* 8.1* 7.7* 7.9*  PHOS  --  5.3*  --   --   --      CBC: Recent Labs  Lab 09/05/17 0346 09/06/17 0333 09/07/17 0508 09/08/17 0425 09/09/17 0339  WBC 12.5* 10.7* 9.5 9.2 9.9  HGB 13.1  11.9* 11.7* 12.2* 11.8*  HCT 39.3* 36.6* 34.7* 37.1* 35.2*  MCV 92.0 92.3 90.8 91.1 91.4  PLT 173 152 143* 150 134*      Lab Results  Component Value Date   HEPBSAG Negative 09/06/2017      Microbiology:  No results found for this or any previous visit (from the past 240 hour(s)).  Coagulation Studies: Recent Labs    09/08/17 0425 09/09/17 0339 09/10/17 0405  LABPROT 22.8* 26.3* 31.9*  INR 2.03 2.44 3.12    Urinalysis: Recent Labs    09/09/17 1915  COLORURINE YELLOW*  LABSPEC 1.004*  PHURINE 9.0*  GLUCOSEU 50*  HGBUR MODERATE*  BILIRUBINUR NEGATIVE  KETONESUR NEGATIVE  PROTEINUR 100*  NITRITE NEGATIVE  LEUKOCYTESUR NEGATIVE      Imaging: No results found.   Medications:   . sodium chloride 40 mL/hr at 09/09/17 1431   . finasteride  5 mg Oral Daily  . levothyroxine  100 mcg Oral QAC breakfast  . metoprolol succinate  25 mg Oral Daily  . multivitamin with minerals  1 tablet Oral Daily  . pneumococcal 23 valent vaccine  0.5 mL Intramuscular Tomorrow-1000  . polyethylene glycol  17 g Oral Daily  . rOPINIRole  0.5 mg Oral TID WC & HS  .  sodium chloride flush  3 mL Intravenous Q12H  . Warfarin - Pharmacist Dosing Inpatient   Does not apply q1800   acetaminophen **OR** acetaminophen, alum & mag hydroxide-simeth, Camphor-Phenol, HYDROcodone-acetaminophen, ondansetron **OR** ondansetron (ZOFRAN) IV  Assessment/ Plan:  66 y.o. Caucasian male With Parkinson's, hypertension, hypothyroidism, hyperlipidemia, atrial fibrillation requiring anticoagulation with warfarin.  He was admitted on 11/10 status post fall and was found to have  acute renal failure and rhabdomyolysis  1.  Acute renal failure.  Baseline creatinine 0.87 2.  Rhabdomyolysis 3.  Hematuria  Acute renal failure is likely secondary to ATN and rhabdomyolysis. UOP remains low.  CK levels are improving HD today;  Hold HD on Sunday and assess UOP. Continues to get iv NS at 40 cc/hr We'll continue  to evaluate daily for need of further dialysis   Hematuria noted.?  Associated with anticoagulation.     LOS: 7 [facility] 11/17/201810:42 AM  [place], [place]

## 2017-09-11 LAB — RENAL FUNCTION PANEL
Albumin: 2.4 g/dL — ABNORMAL LOW (ref 3.5–5.0)
Anion gap: 9 (ref 5–15)
BUN: 29 mg/dL — ABNORMAL HIGH (ref 6–20)
CO2: 28 mmol/L (ref 22–32)
Calcium: 8.1 mg/dL — ABNORMAL LOW (ref 8.9–10.3)
Chloride: 99 mmol/L — ABNORMAL LOW (ref 101–111)
Creatinine, Ser: 4.92 mg/dL — ABNORMAL HIGH (ref 0.61–1.24)
GFR calc Af Amer: 13 mL/min — ABNORMAL LOW (ref >60)
GFR calc non Af Amer: 11 mL/min — ABNORMAL LOW (ref >60)
Glucose, Bld: 96 mg/dL (ref 65–99)
Phosphorus: 3.7 mg/dL (ref 2.5–4.6)
Potassium: 4.2 mmol/L (ref 3.5–5.1)
Sodium: 136 mmol/L (ref 135–145)

## 2017-09-11 LAB — PROTIME-INR
INR: 2.93
Prothrombin Time: 30.3 seconds — ABNORMAL HIGH (ref 11.4–15.2)

## 2017-09-11 LAB — CK: CK TOTAL: 463 U/L — AB (ref 49–397)

## 2017-09-11 MED ORDER — WARFARIN SODIUM 4 MG PO TABS
4.0000 mg | ORAL_TABLET | Freq: Once | ORAL | Status: AC
  Administered 2017-09-11: 4 mg via ORAL
  Filled 2017-09-11: qty 1

## 2017-09-11 NOTE — Progress Notes (Signed)
[place], [place] 09/11/17  Subjective:  Patient is doing fair. Tolerated 1st dialysis on November 12. Serum creatinine Continues to remain critically high at 4.9 although lower than the previous day. CK level has improved to 463 Hemoglobin 11.8 INR is 2.9 Patient is not sure about his urine output.  He is getting normal saline at 40 cc/h He denies any nausea or vomiting.  States he is able to eat his meals  Objective:  Vital signs in last 24 hours:  Temp:  [97.7 F (36.5 C)-98.5 F (36.9 C)] 97.7 F (36.5 C) (11/18 1145) Pulse Rate:  [59-92] 82 (11/18 1145) Resp:  [14-27] 18 (11/18 0513) BP: (113-151)/(72-100) 129/83 (11/18 1145) SpO2:  [93 %-100 %] 96 % (11/18 1145) Weight:  [88 kg (194 lb 1.6 oz)-88.5 kg (195 lb 1.7 oz)] 88 kg (194 lb 1.6 oz) (11/18 0500)  Weight change:  Filed Weights   09/10/17 1447 09/10/17 1750 09/11/17 0500  Weight: 88.5 kg (195 lb 1.7 oz) 88.5 kg (195 lb 1.7 oz) 88 kg (194 lb 1.6 oz)    Intake/Output:    Intake/Output Summary (Last 24 hours) at 09/11/2017 1253 Last data filed at 09/11/2017 0500 Gross per 24 hour  Intake 589.43 ml  Output 75 ml  Net 514.43 ml     Physical Exam: General:  No acute distress, laying in the bed  HEENT  anicteric, moist oral mucous membranes  Neck  no distended neck veins  Pulm/lungs  coarse breath sounds at bases otherwise clear,  CVS/Heart  bradycardic, irregular  Abdomen:   Soft, nontender  Extremities:  Trace edema  Neurologic:  Alert, oriented, Parkinson's tremors  Skin:  No acute rashes  Access:  Temporary dialysis catheter       Basic Metabolic Panel:  Recent Labs  Lab 09/07/17 0508 09/08/17 0425 09/09/17 0339 09/10/17 0405 09/11/17 0420  NA 133* 136 133* 136 136  K 4.1 5.0 4.6 4.7 4.2  CL 100* 100* 96* 99* 99*  CO2 24 25 26 29 28   GLUCOSE 105* 90 91 93 96  BUN 81* 68* 52* 38* 29*  CREATININE 7.49* 6.69* 6.27* 5.29* 4.92*  CALCIUM 8.2* 8.1* 7.7* 7.9*  8.1*  PHOS 5.3*  --   --   --  3.7     CBC: Recent Labs  Lab 09/05/17 0346 09/06/17 0333 09/07/17 0508 09/08/17 0425 09/09/17 0339  WBC 12.5* 10.7* 9.5 9.2 9.9  HGB 13.1 11.9* 11.7* 12.2* 11.8*  HCT 39.3* 36.6* 34.7* 37.1* 35.2*  MCV 92.0 92.3 90.8 91.1 91.4  PLT 173 152 143* 150 134*      Lab Results  Component Value Date   HEPBSAG Negative 09/06/2017      Microbiology:  No results found for this or any previous visit (from the past 240 hour(s)).  Coagulation Studies: Recent Labs    09/09/17 0339 09/10/17 0405 09/11/17 0420  LABPROT 26.3* 31.9* 30.3*  INR 2.44 3.12 2.93    Urinalysis: Recent Labs    09/09/17 1915  COLORURINE YELLOW*  LABSPEC 1.004*  PHURINE 9.0*  GLUCOSEU 50*  HGBUR MODERATE*  BILIRUBINUR NEGATIVE  KETONESUR NEGATIVE  PROTEINUR 100*  NITRITE NEGATIVE  LEUKOCYTESUR NEGATIVE      Imaging: No results found.   Medications:   . sodium chloride 40 mL/hr at 09/10/17 2028   . finasteride  5 mg Oral Daily  . levothyroxine  100 mcg Oral QAC breakfast  . metoprolol succinate  25 mg Oral Daily  . multivitamin with  minerals  1 tablet Oral Daily  . pneumococcal 23 valent vaccine  0.5 mL Intramuscular Tomorrow-1000  . polyethylene glycol  17 g Oral Daily  . rOPINIRole  0.5 mg Oral TID WC & HS  . sodium chloride flush  3 mL Intravenous Q12H  . warfarin  4 mg Oral ONCE-1800  . Warfarin - Pharmacist Dosing Inpatient   Does not apply q1800   acetaminophen **OR** acetaminophen, alum & mag hydroxide-simeth, Camphor-Phenol, HYDROcodone-acetaminophen, ondansetron **OR** ondansetron (ZOFRAN) IV  Assessment/ Plan:  66 y.o. Caucasian male With Parkinson's, hypertension, hypothyroidism, hyperlipidemia, atrial fibrillation requiring anticoagulation with warfarin.  He was admitted on 11/10 status post fall and was found to have  acute renal failure and rhabdomyolysis  1.  Acute renal failure.  Baseline creatinine 0.87 2.  Rhabdomyolysis 3.   Hematuria  Acute renal failure is likely secondary to ATN and rhabdomyolysis.  -Monitor strict I's and O's CK levels are improving Hold HD on Sunday and assess UOP. Continues to get iv NS at 40 cc/hr We'll continue to evaluate daily for need of further dialysis   Hematuria noted.?  Associated with anticoagulation.   If prolonged dialysis is required, patient may need a PermCath.  In that case, warfarin will need to be held.   LOS: 8 [facility] 11/18/201812:53 PM  [place], [place]

## 2017-09-11 NOTE — Progress Notes (Signed)
Patient ID: Douglas Corrente., male   DOB: [birth year], 66 y.o.   MRN: [id number]  [facility] PROGRESS NOTE  Douglas Corrente. VHQ:[id number] DOB: [birth year] DOA: 09/03/2017 PCP: [name], MD  HPI/Subjective: Patient states that he is feeling okay, little upset about being disturbed  Objective: Vitals:   09/11/17 0513 09/11/17 1145  BP: 137/88 129/83  Pulse: 82 82  Resp: 18   Temp: 98.2 F (36.8 C) 97.7 F (36.5 C)  SpO2: 96% 96%    Filed Weights   09/10/17 1447 09/10/17 1750 09/11/17 0500  Weight: 195 lb 1.7 oz (88.5 kg) 195 lb 1.7 oz (88.5 kg) 194 lb 1.6 oz (88 kg)    ROS: Review of Systems  Constitutional: Negative for chills and fever.  Eyes: Negative for blurred vision.  Respiratory: Negative for cough and shortness of breath.   Cardiovascular: Negative for chest pain.  Gastrointestinal: Negative for abdominal pain, constipation, diarrhea, nausea and vomiting.  Genitourinary: Negative for dysuria.  Musculoskeletal: Negative for back pain and joint pain.  Neurological: Positive for tremors. Negative for dizziness and headaches.   Exam: Physical Exam  HENT:  Nose: No mucosal edema.  Mouth/Throat: No oropharyngeal exudate or posterior oropharyngeal edema.  Eyes: Conjunctivae, EOM and lids are normal. Pupils are equal, round, and reactive to light.  Neck: No JVD present. Carotid bruit is not present. No edema present. No thyroid mass and no thyromegaly present.  Cardiovascular: S1 normal and S2 normal. Exam reveals no gallop.  No murmur heard. Pulses:      Dorsalis pedis pulses are 2+ on the right side, and 2+ on the left side.  Respiratory: No respiratory distress. He has decreased breath sounds in the left lower field. He has no wheezes. He has no rhonchi. He has no rales.  GI: Soft. Bowel sounds are normal. There is no tenderness.  Musculoskeletal:       Right shoulder: He exhibits no swelling.  Lymphadenopathy:    He has no cervical  adenopathy.  Neurological: He is alert. No cranial nerve deficit.  Skin: Skin is warm. No rash noted. Nails show no clubbing.  Bruising left knee from previous fall  Psychiatric: He has a normal mood and affect.      Data Reviewed: Basic Metabolic Panel: Recent Labs  Lab 09/07/17 0508 09/08/17 0425 09/09/17 0339 09/10/17 0405 09/11/17 0420  NA 133* 136 133* 136 136  K 4.1 5.0 4.6 4.7 4.2  CL 100* 100* 96* 99* 99*  CO2 24 25 26 29 28   GLUCOSE 105* 90 91 93 96  BUN 81* 68* 52* 38* 29*  CREATININE 7.49* 6.69* 6.27* 5.29* 4.92*  CALCIUM 8.2* 8.1* 7.7* 7.9* 8.1*  PHOS 5.3*  --   --   --  3.7   CBC: Recent Labs  Lab 09/05/17 0346 09/06/17 0333 09/07/17 0508 09/08/17 0425 09/09/17 0339  WBC 12.5* 10.7* 9.5 9.2 9.9  HGB 13.1 11.9* 11.7* 12.2* 11.8*  HCT 39.3* 36.6* 34.7* 37.1* 35.2*  MCV 92.0 92.3 90.8 91.1 91.4  PLT 173 152 143* 150 134*    Scheduled Meds: . finasteride  5 mg Oral Daily  . levothyroxine  100 mcg Oral QAC breakfast  . metoprolol succinate  25 mg Oral Daily  . multivitamin with minerals  1 tablet Oral Daily  . pneumococcal 23 valent vaccine  0.5 mL Intramuscular Tomorrow-1000  . polyethylene glycol  17 g Oral Daily  . rOPINIRole  0.5 mg Oral TID WC & HS  .  sodium chloride flush  3 mL Intravenous Q12H  . warfarin  4 mg Oral ONCE-1800  . Warfarin - Pharmacist Dosing Inpatient   Does not apply q1800   Continuous Infusions: . sodium chloride 40 mL/hr at 09/10/17 2028    Assessment/Plan:  1. Acute kidney injury felt to be due to ATN, hemodialysis yesterday today will be on hold 2. Acute rhabdomyolysis.  Likely from falls and statin medication.   Continue IV fluid hydration at lower rate CPK lower today 3. BPH.  Continue finasteride.  With Parkinson's Flomax is likely not a good medication for this patient.  PSA normal range.  Patient is noted to have some hematuria renal ultrasound shows no pathology likely related to chronic anticoagulation will need  outpatient urology follow-up 4. Hypothyroidism unspecified on levothyroxine.  TSH normal range. 5. Atrial fibrillation history rate control with metoprolol.  Continue Coumadin and pharmacy is dosing. 6. Physical therapy evaluation: SNF. 7. Parkinson's disease on [place]qu[place] 8. Hyperlipidemia unspecified.  Stopped simvastatin due to severe rhabdomyolysis    Code Status:     Code Status Orders  (From admission, onward)        Start     Ordered   09/04/17 0042  Full code  Continuous     09/04/17 0041    Code Status History    Date Active Date Inactive Code Status Order ID Comments User Context   06/01/2017 20:14 06/03/2017 15:06 Full Code [id number]  Loletha Grayer, MD ED     Family Communication: Patient's sister. Disposition Plan: to be determined.  Consultants:  Nephrology  Time spent: 33 minutes, case discussed with Dr. Candiss Norse, RN and CM.  [facility]onto [facility]

## 2017-09-11 NOTE — Progress Notes (Signed)
ANTICOAGULATION CONSULT NOTE - Follow up [place] for warfarin Indication: atrial fibrillation  No Known Allergies  Patient Measurements: Height: 5\' 11"  (180.3 cm) Weight: 194 lb 1.6 oz (88 kg) IBW/kg (Calculated) : 75.3   Vital Signs: Temp: 98.2 F (36.8 C) (11/18 0513) Temp Source: Oral (11/18 0513) BP: 137/88 (11/18 0513) Pulse Rate: 82 (11/18 0513)  Labs: Recent Labs    09/09/17 0339 09/10/17 0405 09/11/17 0420  HGB 11.8*  --   --   HCT 35.2*  --   --   PLT 134*  --   --   LABPROT 26.3* 31.9* 30.3*  INR 2.44 3.12 2.93  CREATININE 6.27* 5.29* 4.92*  CKTOTAL 1,256* 708* 463*    Estimated Creatinine Clearance: 15.9 mL/min (A) (by C-G formula based on SCr of 4.92 mg/dL (H)).   Medical History: Past Medical History:  Diagnosis Date  . Atrial fibrillation ([place])   . Cardiomyopathy ([place])   . Coronary artery disease   . Hypertension   . Hypothyroidism   . Parkinson's disease [facility])      Assessment: Patient admitted for fall, is chronically anticoagulated w/ warfarin for afib. CT head with no acute intracranial hemorrhage. Spoke with pt about warfarin who states he takes one whole tab daily of pink/peach color tablet (could not state mg strength so asked color of tablet); home dose listed as 5 mg daily on PTA med list.   Date INR Dose 11/11 3.05 Held 11/12 2.68 5 mg 11/13 2.29 5 mg 11/14 2.02 6 mg 11/15 2.03 6 mg 11/16 2.44 4 mg 11/17 3.12 3 mg 11/18 2.93  Goal of Therapy:  INR 2-3 Monitor platelets by anticoagulation protocol: Yes   Plan:  INR therapeutic. Will give Warfarin 4 mg PO x 1 tonight. Follow up INR in AM. Daily INR and CBC at least every three days per policy - Hgb stable past few days  Chinita Greenland PharmD Clinical Pharmacist 09/11/2017

## 2017-09-12 LAB — CBC
HCT: 33.3 % — ABNORMAL LOW (ref 40.0–52.0)
Hemoglobin: 11.2 g/dL — ABNORMAL LOW (ref 13.0–18.0)
MCH: 30.7 pg (ref 26.0–34.0)
MCHC: 33.6 g/dL (ref 32.0–36.0)
MCV: 91.3 fL (ref 80.0–100.0)
PLATELETS: 164 10*3/uL (ref 150–440)
RBC: 3.65 MIL/uL — ABNORMAL LOW (ref 4.40–5.90)
RDW: 13.2 % (ref 11.5–14.5)
WBC: 10.3 10*3/uL (ref 3.8–10.6)

## 2017-09-12 LAB — PROTIME-INR
INR: 3.01
PROTHROMBIN TIME: 31 s — AB (ref 11.4–15.2)

## 2017-09-12 MED ORDER — WARFARIN SODIUM 3 MG PO TABS
3.0000 mg | ORAL_TABLET | Freq: Once | ORAL | Status: AC
  Administered 2017-09-12: 3 mg via ORAL
  Filled 2017-09-12 (×2): qty 1

## 2017-09-12 MED ORDER — TUBERCULIN PPD 5 UNIT/0.1ML ID SOLN
5.0000 [IU] | Freq: Once | INTRADERMAL | Status: AC
  Administered 2017-09-12: 5 [IU] via INTRADERMAL
  Filled 2017-09-12: qty 0.1

## 2017-09-12 NOTE — Progress Notes (Signed)
Nutrition Follow Up Note   DOCUMENTATION CODES:   Not applicable  INTERVENTION:   Recommend check Magnesium levels   Bowel regimen as needed  Liberalize diet   Provide Carnation Instant Breakfast in whole milk po TID, each supplement provides 280 kcal, 13 grams of protein.  Continue daily MVI.  NUTRITION DIAGNOSIS:   Inadequate oral intake related to decreased appetite as evidenced by per patient/family report. -improving   GOAL:   Patient will meet greater than or equal to 90% of their needs  -progressing  MONITOR:   PO intake, Supplement acceptance, Labs, Weight trends, I & O's  ASSESSMENT:   66 year old male with PMHx of HTN, CAD, A-fib, cardiomyopathy, Parkinson's disease, hypothyroidism who is admitted with acute rhabdomyolysis after a fall, also found to have acute kidney injury, hypotension.   -Patient s/p placement of temporary triple lumen dialysis catheter in right femoral vein and initiated on HD 11/12  Pt appetite is improved; eating 100% of meals. Per chart, pt with 35lb weight gain since admit; pt +10L since admit. No BM since 11/17; recommend bowel regimen as needed. Last HD 11/17; per MD note, hold on HD for now. RD will liberalize diet as a heart healthy diet restricts protein by restricting fat. Recommend check Magnesium levels. Pt to discharge to [facility] when medically appropriate.   Medications reviewed and include: synthroid, MVI, miralax, warfarin, NaCl @40ml /hr   Labs reviewed: Cl 99(L), BUN 29(H), creat 4.92(H), Ca 8.1(L) adj. 9.38 wnl, P 3.7 wnl, alb 2.4(L) CK- 463(H)  Diet Order:  Diet 2 gram sodium Room service appropriate? Yes; Fluid consistency: Thin  EDUCATION NEEDS:   No education needs have been identified at this time  Skin:  Reviewed RN Assessment  Last BM:  11/17- type 4  Height:   Ht Readings from Last 1 Encounters:  09/04/17 5\' 11"  (1.803 m)    Weight:   Wt Readings from Last 1 Encounters:  09/12/17 197 lb  (89.4 kg)    Ideal Body Weight:  78.2 kg  BMI:  Body mass index is 27.48 kg/m.  Estimated Nutritional Needs:   Kcal:  1885-2200 (MSJ x 1.2-1.4)  Protein:  75-90 grams (1-1.2 grams/kg)  Fluid:  per MD  Koleen Distance MS, RD, LDN Pager #- [phone] After Hours Pager: [phone]

## 2017-09-12 NOTE — Progress Notes (Signed)
PRE HD ASSESSMENT 

## 2017-09-12 NOTE — Progress Notes (Signed)
[place], [place]   Subjective:    Late entry Patient seen  During HD treatment Tolerating well./     Objective:  Vital signs in last 24 hours:  Temp:  [97.7 F (36.5 C)-98.1 F (36.7 C)] 97.7 F (36.5 C) (11/19 0523) Pulse Rate:  [82-95] 87 (11/19 0523) Resp:  [16-19] 16 (11/19 0523) BP: (129-138)/(83-106) 138/88 (11/19 0523) SpO2:  [96 %-97 %] 97 % (11/19 0523) Weight:  [89.4 kg (197 lb)] 89.4 kg (197 lb) (11/19 0500)  Weight change: 0.859 kg (1 lb 14.3 oz) Filed Weights   09/10/17 1750 09/11/17 0500 09/12/17 0500  Weight: 88.5 kg (195 lb 1.7 oz) 88 kg (194 lb 1.6 oz) 89.4 kg (197 lb)    Intake/Output:    Intake/Output Summary (Last 24 hours) at 09/12/2017 1049 Last data filed at 09/12/2017 1013 Gross per 24 hour  Intake 943 ml  Output 100 ml  Net 843 ml     Physical Exam: General:  No acute distress, laying in the bed  HEENT  anicteric, moist oral mucous membranes  Neck  no distended neck veins  Pulm/lungs  coarse breath sounds at bases otherwise clear,  CVS/Heart  bradycardic, irregular  Abdomen:   Soft, nontender  Extremities:  Trace edema  Neurologic:  Alert, oriented, Parkinson's tremors  Skin:  No acute rashes  Access:  Temporary dialysis catheter       Basic Metabolic Panel:  Recent Labs  Lab 09/07/17 0508 09/08/17 0425 09/09/17 0339 09/10/17 0405 09/11/17 0420  NA 133* 136 133* 136 136  K 4.1 5.0 4.6 4.7 4.2  CL 100* 100* 96* 99* 99*  CO2 24 25 26 29 28   GLUCOSE 105* 90 91 93 96  BUN 81* 68* 52* 38* 29*  CREATININE 7.49* 6.69* 6.27* 5.29* 4.92*  CALCIUM 8.2* 8.1* 7.7* 7.9* 8.1*  PHOS 5.3*  --   --   --  3.7     CBC: Recent Labs  Lab 09/06/17 0333 09/07/17 0508 09/08/17 0425 09/09/17 0339 09/12/17 0355  WBC 10.7* 9.5 9.2 9.9 10.3  HGB 11.9* 11.7* 12.2* 11.8* 11.2*  HCT 36.6* 34.7* 37.1* 35.2* 33.3*  MCV 92.3 90.8 91.1 91.4 91.3  PLT 152 143* 150 134* 164      Lab Results  Component  Value Date   HEPBSAG Negative 09/06/2017      Microbiology:  No results found for this or any previous visit (from the past 240 hour(s)).  Coagulation Studies: Recent Labs    09/10/17 0405 09/11/17 0420 09/12/17 0355  LABPROT 31.9* 30.3* 31.0*  INR 3.12 2.93 3.01    Urinalysis: Recent Labs    09/09/17 1915  COLORURINE YELLOW*  LABSPEC 1.004*  PHURINE 9.0*  GLUCOSEU 50*  HGBUR MODERATE*  BILIRUBINUR NEGATIVE  KETONESUR NEGATIVE  PROTEINUR 100*  NITRITE NEGATIVE  LEUKOCYTESUR NEGATIVE      Imaging: No results found.   Medications:   . sodium chloride 40 mL/hr at 09/12/17 0021   . finasteride  5 mg Oral Daily  . levothyroxine  100 mcg Oral QAC breakfast  . metoprolol succinate  25 mg Oral Daily  . multivitamin with minerals  1 tablet Oral Daily  . pneumococcal 23 valent vaccine  0.5 mL Intramuscular Tomorrow-1000  . polyethylene glycol  17 g Oral Daily  . rOPINIRole  0.5 mg Oral TID WC & HS  . sodium chloride flush  3 mL Intravenous Q12H  . warfarin  3 mg Oral ONCE-1800  . Warfarin -  Pharmacist Dosing Inpatient   Does not apply q1800   acetaminophen **OR** acetaminophen, alum & mag hydroxide-simeth, Camphor-Phenol, HYDROcodone-acetaminophen, ondansetron **OR** ondansetron (ZOFRAN) IV  Assessment/ Plan:  66 y.o. Caucasian male With Parkinson's, hypertension, hypothyroidism, hyperlipidemia, atrial fibrillation requiring anticoagulation with warfarin.  He was admitted on 11/10 status post fall and was found to have  acute renal failure and rhabdomyolysis  1.  Acute renal failure.  Baseline creatinine 0.87 2.  Rhabdomyolysis 3.  Hematuria  Acute renal failure is likely secondary to ATN and rhabdomyolysis.  CK levels are improving Patient seen during dialysis.  Tolerating well We'll continue to evaluate daily for need of further dialysis   Hematuria noted.?  Associated with anticoagulation. repeat u/a   LOS: [place] 11/19/201810:49  [place], [place]

## 2017-09-12 NOTE — Progress Notes (Signed)
Patient ID: Douglas Silva., male   DOB: 1951-06-19, 66 y.o.   MRN: [id number]  [facility] PROGRESS NOTE  Douglas Silva. YIF:[id number] DOB: Oct 11, 1951 DOA: 09/03/2017 PCP: [name], MD  HPI/Subjective: Patient denies any complaints had hemodialysis earlier today  Objective: Vitals:   09/12/17 1230 09/12/17 1317  BP: (!) 135/95 122/76  Pulse: 81 90  Resp: (!) 22   Temp:  97.7 F (36.5 C)  SpO2:  (!) 73%    Filed Weights   09/11/17 0500 09/12/17 0500 09/12/17 0942  Weight: 194 lb 1.6 oz (88 kg) 197 lb (89.4 kg) 196 lb 13.9 oz (89.3 kg)    ROS: Review of Systems  Constitutional: Negative for chills and fever.  Eyes: Negative for blurred vision.  Respiratory: Negative for cough and shortness of breath.   Cardiovascular: Negative for chest pain.  Gastrointestinal: Negative for abdominal pain, constipation, diarrhea, nausea and vomiting.  Genitourinary: Negative for dysuria.  Musculoskeletal: Negative for back pain and joint pain.  Neurological: Positive for tremors. Negative for dizziness and headaches.   Exam: Physical Exam  HENT:  Nose: No mucosal edema.  Mouth/Throat: No oropharyngeal exudate or posterior oropharyngeal edema.  Eyes: Conjunctivae, EOM and lids are normal. Pupils are equal, round, and reactive to light.  Neck: No JVD present. Carotid bruit is not present. No edema present. No thyroid mass and no thyromegaly present.  Cardiovascular: S1 normal and S2 normal. Exam reveals no gallop.  No murmur heard. Pulses:      Dorsalis pedis pulses are 2+ on the right side, and 2+ on the left side.  Respiratory: No respiratory distress. He has decreased breath sounds in the left lower field. He has no wheezes. He has no rhonchi. He has no rales.  GI: Soft. Bowel sounds are normal. There is no tenderness.  Musculoskeletal:       Right shoulder: He exhibits no swelling.  Lymphadenopathy:    He has no cervical adenopathy.  Neurological: He is  alert. No cranial nerve deficit.  Skin: Skin is warm. No rash noted. Nails show no clubbing.  Bruising left knee from previous fall  Psychiatric: He has a normal mood and affect.      Data Reviewed: Basic Metabolic Panel: Recent Labs  Lab 09/07/17 0508 09/08/17 0425 09/09/17 0339 09/10/17 0405 09/11/17 0420  NA 133* 136 133* 136 136  K 4.1 5.0 4.6 4.7 4.2  CL 100* 100* 96* 99* 99*  CO2 24 25 26 29 28   GLUCOSE 105* 90 91 93 96  BUN 81* 68* 52* 38* 29*  CREATININE 7.49* 6.69* 6.27* 5.29* 4.92*  CALCIUM 8.2* 8.1* 7.7* 7.9* 8.1*  PHOS 5.3*  --   --   --  3.7   CBC: Recent Labs  Lab 09/06/17 0333 09/07/17 0508 09/08/17 0425 09/09/17 0339 09/12/17 0355  WBC 10.7* 9.5 9.2 9.9 10.3  HGB 11.9* 11.7* 12.2* 11.8* 11.2*  HCT 36.6* 34.7* 37.1* 35.2* 33.3*  MCV 92.3 90.8 91.1 91.4 91.3  PLT 152 143* 150 134* 164    Scheduled Meds: . finasteride  5 mg Oral Daily  . levothyroxine  100 mcg Oral QAC breakfast  . metoprolol succinate  25 mg Oral Daily  . multivitamin with minerals  1 tablet Oral Daily  . pneumococcal 23 valent vaccine  0.5 mL Intramuscular Tomorrow-1000  . polyethylene glycol  17 g Oral Daily  . rOPINIRole  0.5 mg Oral TID WC & HS  . sodium chloride flush  3  mL Intravenous Q12H  . warfarin  3 mg Oral ONCE-1800  . Warfarin - Pharmacist Dosing Inpatient   Does not apply q1800   Continuous Infusions: . sodium chloride 40 mL/hr at 09/12/17 0021    Assessment/Plan:  1. Acute kidney injury felt to be due to ATN, hemodialysis hemodialysis again today 2. Acute rhabdomyolysis.  Likely from falls and statin medication.   Continue IV fluid hydration at lower rate repeat CPK tomorrow 3. BPH.  Continue finasteride.  With Parkinson's Flomax is likely not a good medication for this patient.  PSA normal range.  Patient is noted to have some hematuria renal ultrasound shows no pathology likely related to chronic anticoagulation will need outpatient urology  follow-up 4. Hypothyroidism unspecified on levothyroxine.  TSH normal range. 5. Atrial fibrillation history rate control with metoprolol.  Continue Coumadin and pharmacy is dosing. 6. Physical therapy evaluation: SNF. 7. Parkinson's disease on Requip 8. Hyperlipidemia unspecified.  Stopped simvastatin due to severe rhabdomyolysis    Code Status:     Code Status Orders  (From admission, onward)        Start     Ordered   09/04/17 0042  Full code  Continuous     09/04/17 0041    Code Status History    Date Active Date Inactive Code Status Order ID Comments User Context   06/01/2017 20:14 06/03/2017 15:06 Full Code [id number]  Loletha Grayer, MD ED     Family Communication: Patient's sister. Disposition Plan: to be determined.  Consultants:  Nephrology  Time spent: 33 minutes, case discussed with Dr. Candiss Norse, RN and CM.  Posey Pronto [facility]

## 2017-09-12 NOTE — Progress Notes (Signed)
PT Cancellation Note  Patient Details Name: Douglas Silva. MRN: [id number] DOB: 1951/07/08   Cancelled Treatment:    Reason Eval/Treat Not Completed: Patient at procedure or test/unavailable.  Pt currently off floor for dialysis.  Will re-attempt PT treatment session at a later date/time.  Leitha Bleak, PT 09/12/17, 12:00 PM [phone]

## 2017-09-12 NOTE — Progress Notes (Signed)
PRE HD   

## 2017-09-12 NOTE — Progress Notes (Signed)
[facility], [place] 09/12/17  Subjective:  Patient seen and evaluated during hemodialysis today. Urine output remains quite low at 100 cc over the preceding 24 hours.   Objective:  Vital signs in last 24 hours:  Temp:  [97.7 F (36.5 C)-98.1 F (36.7 C)] 97.7 F (36.5 C) (11/19 0523) Pulse Rate:  [82-95] 87 (11/19 0523) Resp:  [16-28] 28 (11/19 1130) BP: (106-138)/(82-106) 132/95 (11/19 1130) SpO2:  [96 %-97 %] 97 % (11/19 0523) Weight:  [89.4 kg (197 lb)] 89.4 kg (197 lb) (11/19 0500)  Weight change: 0.859 kg (1 lb 14.3 oz) Filed Weights   09/10/17 1750 09/11/17 0500 09/12/17 0500  Weight: 88.5 kg (195 lb 1.7 oz) 88 kg (194 lb 1.6 oz) 89.4 kg (197 lb)    Intake/Output:    Intake/Output Summary (Last 24 hours) at 09/12/2017 1143 Last data filed at 09/12/2017 1013 Gross per 24 hour  Intake 943 ml  Output 100 ml  Net 843 ml     Physical Exam: General:  No acute distress, laying in the bed  HEENT  anicteric, moist oral mucous membranes  Neck  supple  Pulm/lungs  coarse breath sounds at bases  CVS/Heart  bradycardic, irregular  Abdomen:   Soft, nontender  Extremities:  Trace edema  Neurologic:  Alert, oriented, Parkinson's tremors  Skin:  No acute rashes  Access:  Temporary dialysis catheter       Basic Metabolic Panel:  Recent Labs  Lab 09/07/17 0508 09/08/17 0425 09/09/17 0339 09/10/17 0405 09/11/17 0420  NA 133* 136 133* 136 136  K 4.1 5.0 4.6 4.7 4.2  CL 100* 100* 96* 99* 99*  CO2 24 25 26 29 28   GLUCOSE 105* 90 91 93 96  BUN 81* 68* 52* 38* 29*  CREATININE 7.49* 6.69* 6.27* 5.29* 4.92*  CALCIUM 8.2* 8.1* 7.7* 7.9* 8.1*  PHOS 5.3*  --   --   --  3.7     CBC: Recent Labs  Lab 09/06/17 0333 09/07/17 0508 09/08/17 0425 09/09/17 0339 09/12/17 0355  WBC 10.7* 9.5 9.2 9.9 10.3  HGB 11.9* 11.7* 12.2* 11.8* 11.2*  HCT 36.6* 34.7* 37.1* 35.2* 33.3*  MCV 92.3 90.8 91.1 91.4 91.3  PLT 152 143* 150 134* 164       Lab Results  Component Value Date   HEPBSAG Negative 09/06/2017      Microbiology:  No results found for this or any previous visit (from the past 240 hour(s)).  Coagulation Studies: Recent Labs    09/10/17 0405 09/11/17 0420 09/12/17 0355  LABPROT 31.9* 30.3* 31.0*  INR 3.12 2.93 3.01    Urinalysis: Recent Labs    09/09/17 1915  COLORURINE YELLOW*  LABSPEC 1.004*  PHURINE 9.0*  GLUCOSEU 50*  HGBUR MODERATE*  BILIRUBINUR NEGATIVE  KETONESUR NEGATIVE  PROTEINUR 100*  NITRITE NEGATIVE  LEUKOCYTESUR NEGATIVE      Imaging: No results found.   Medications:   . sodium chloride 40 mL/hr at 09/12/17 0021   . finasteride  5 mg Oral Daily  . levothyroxine  100 mcg Oral QAC breakfast  . metoprolol succinate  25 mg Oral Daily  . multivitamin with minerals  1 tablet Oral Daily  . pneumococcal 23 valent vaccine  0.5 mL Intramuscular Tomorrow-1000  . polyethylene glycol  17 g Oral Daily  . rOPINIRole  0.5 mg Oral TID WC & HS  . sodium chloride flush  3 mL Intravenous Q12H  . warfarin  3 mg Oral ONCE-1800  . Warfarin -  Pharmacist Dosing Inpatient   Does not apply q1800   acetaminophen **OR** acetaminophen, alum & mag hydroxide-simeth, Camphor-Phenol, HYDROcodone-acetaminophen, ondansetron **OR** ondansetron (ZOFRAN) IV  Assessment/ Plan:  66 y.o. Caucasian male With Parkinson's, hypertension, hypothyroidism, hyperlipidemia, atrial fibrillation requiring anticoagulation with warfarin.  He was admitted on 11/10 status post fall and was found to have  acute renal failure and rhabdomyolysis  1.  Acute renal failure.  Baseline creatinine 0.87 2.  Rhabdomyolysis 3.  Hematuria  Acute renal failure is likely secondary to ATN and rhabdomyolysis.  -Urine output remains poor at 100 cc over the preceding 24 hours.  Therefore we will proceed with hemodialysis today.  Patient seen and evaluated during hemodialysis and appears to be tolerating well.  If renal failure persist we  may need to consider PermCath placement.    LOS: [place], Jawana Reagor 11/19/201811:43 [place], [place]

## 2017-09-12 NOTE — Progress Notes (Signed)
HD Started 

## 2017-09-12 NOTE — Care Management (Signed)
Requested PPD from attending per request of dialysis coordinator

## 2017-09-12 NOTE — Progress Notes (Signed)
ANTICOAGULATION CONSULT NOTE - Follow Up Consult  Pharmacy Consult for Warfarin Indication: atrial fibrillation  No Known Allergies  Patient Measurements: Height: 5\' 11"  (180.3 cm) Weight: 197 lb (89.4 kg) IBW/kg (Calculated) : 75.3 Heparin Dosing Weight: na  Vital Signs: Temp: 97.7 F (36.5 C) (11/19 0523) Temp Source: Oral (11/19 0523) BP: 138/88 (11/19 0523) Pulse Rate: 87 (11/19 0523)  Labs: Recent Labs    09/10/17 0405 09/11/17 0420 09/12/17 0355  HGB  --   --  11.2*  HCT  --   --  33.3*  PLT  --   --  164  LABPROT 31.9* 30.3* 31.0*  INR 3.12 2.93 3.01  CREATININE 5.29* 4.92*  --   CKTOTAL 708* 463*  --     Estimated Creatinine Clearance: 15.9 mL/min (A) (by C-G formula based on SCr of 4.92 mg/dL (H)).  Assessment: Patient admitted for fall, is chronically anticoagulated w/ warfarin for afib. CT head with no acute intracranial hemorrhage. Pharmacist spoke with pt about warfarin who states he takes one whole tab daily of pink/peach color tablet (could not state mg strength so asked color of tablet); home dose listed as 5 mg daily on PTA med list.   Date    INR      Dose 11/11   3.05     Held 11/12   2.68     5 mg 11/13   2.29     5 mg 11/14   2.02     6 mg 11/15   2.03     6 mg 11/16   2.44     4 mg 11/17   3.12     3 mg 11/18   2.93     4 mg 11/19   3.01   Goal of Therapy:  INR 2-3 Monitor platelets by anticoagulation protocol: Yes   Plan:  INR therapeutic but at upper limits. Will give Warfarin 3 mg PO x 1 tonight. Follow up INR in AM.  Paulina Fusi, PharmD, BCPS 09/12/2017 11:34 AM

## 2017-09-13 DIAGNOSIS — N189 Chronic kidney disease, unspecified: Secondary | ICD-10-CM

## 2017-09-13 LAB — PHOSPHORUS: PHOSPHORUS: 5.1 mg/dL — AB (ref 2.5–4.6)

## 2017-09-13 LAB — RENAL FUNCTION PANEL
ALBUMIN: 2.5 g/dL — AB (ref 3.5–5.0)
ANION GAP: 8 (ref 5–15)
BUN: 42 mg/dL — ABNORMAL HIGH (ref 6–20)
CO2: 28 mmol/L (ref 22–32)
Calcium: 8.1 mg/dL — ABNORMAL LOW (ref 8.9–10.3)
Chloride: 98 mmol/L — ABNORMAL LOW (ref 101–111)
Creatinine, Ser: 6.03 mg/dL — ABNORMAL HIGH (ref 0.61–1.24)
GFR calc non Af Amer: 9 mL/min — ABNORMAL LOW (ref >60)
GFR, EST AFRICAN AMERICAN: 10 mL/min — AB (ref >60)
GLUCOSE: 95 mg/dL (ref 65–99)
PHOSPHORUS: 4.8 mg/dL — AB (ref 2.5–4.6)
POTASSIUM: 4.5 mmol/L (ref 3.5–5.1)
Sodium: 134 mmol/L — ABNORMAL LOW (ref 135–145)

## 2017-09-13 LAB — PROTIME-INR
INR: 2.72
Prothrombin Time: 28.6 seconds — ABNORMAL HIGH (ref 11.4–15.2)

## 2017-09-13 LAB — CK: Total CK: 177 U/L (ref 49–397)

## 2017-09-13 MED ORDER — WARFARIN SODIUM 4 MG PO TABS
4.0000 mg | ORAL_TABLET | Freq: Once | ORAL | Status: DC
  Filled 2017-09-13: qty 1

## 2017-09-13 NOTE — Progress Notes (Signed)
Post hd assessment unchanged  

## 2017-09-13 NOTE — Progress Notes (Signed)
Pre hd 

## 2017-09-13 NOTE — Progress Notes (Signed)
[facility], [place] 09/13/17  Subjective:  Urine output did increase over the preceding 24 hours to 420 cc. He will need a PermCath if he will continue on dialysis. He is seen and evaluated during hemodialysis today.  Objective:  Vital signs in last 24 hours:  Temp:  [97.7 F (36.5 C)-99 F (37.2 C)] 98.1 F (36.7 C) (11/20 1027) Pulse Rate:  [70-98] 70 (11/20 1130) Resp:  [14-27] 20 (11/20 1130) BP: (122-138)/(76-97) 135/93 (11/20 1130) SpO2:  [73 %-100 %] 98 % (11/20 1130) Weight:  [91 kg (200 lb 9.6 oz)-91 kg (200 lb 9.9 oz)] 91 kg (200 lb 9.9 oz) (11/20 1027)  Weight change: -0.059 kg (-2.1 oz) Filed Weights   09/12/17 0942 09/13/17 0500 09/13/17 1027  Weight: 89.3 kg (196 lb 13.9 oz) 91 kg (200 lb 9.6 oz) 91 kg (200 lb 9.9 oz)    Intake/Output:    Intake/Output Summary (Last 24 hours) at 09/13/2017 1148 Last data filed at 09/13/2017 0802 Gross per 24 hour  Intake 784.33 ml  Output 540 ml  Net 244.33 ml     Physical Exam: General:  No acute distress, laying in the bed  HEENT  anicteric, moist oral mucous membranes  Neck  supple  Pulm/lungs  coarse breath sounds   CVS/Heart  irregular  Abdomen:   Soft, nontender  Extremities:  Trace edema  Neurologic:  Alert, oriented, Parkinson's tremors  Skin:  No acute rashes  Access:  Temporary dialysis catheter left femoral vein       Basic Metabolic Panel:  Recent Labs  Lab 09/07/17 0508 09/08/17 0425 09/09/17 0339 09/10/17 0405 09/11/17 0420 09/13/17 0515 09/13/17 0920  NA 133* 136 133* 136 136 134*  --   K 4.1 5.0 4.6 4.7 4.2 4.5  --   CL 100* 100* 96* 99* 99* 98*  --   CO2 24 25 26 29 28 28   --   GLUCOSE 105* 90 91 93 96 95  --   BUN 81* 68* 52* 38* 29* 42*  --   CREATININE 7.49* 6.69* 6.27* 5.29* 4.92* 6.03*  --   CALCIUM 8.2* 8.1* 7.7* 7.9* 8.1* 8.1*  --   PHOS 5.3*  --   --   --  3.7 4.8* 5.1*     CBC: Recent Labs  Lab 09/07/17 0508 09/08/17 0425 09/09/17 0339  09/12/17 0355  WBC 9.5 9.2 9.9 10.3  HGB 11.7* 12.2* 11.8* 11.2*  HCT 34.7* 37.1* 35.2* 33.3*  MCV 90.8 91.1 91.4 91.3  PLT 143* 150 134* 164      Lab Results  Component Value Date   HEPBSAG Negative 09/06/2017      Microbiology:  No results found for this or any previous visit (from the past 240 hour(s)).  Coagulation Studies: Recent Labs    09/11/17 0420 09/12/17 0355 09/13/17 0516  LABPROT 30.3* 31.0* 28.6*  INR 2.93 3.01 2.72    Urinalysis: No results for input(s): COLORURINE, LABSPEC, PHURINE, GLUCOSEU, HGBUR, BILIRUBINUR, KETONESUR, PROTEINUR, UROBILINOGEN, NITRITE, LEUKOCYTESUR in the last 72 hours.  Invalid input(s): APPERANCEUR    Imaging: No results found.   Medications:   . sodium chloride 40 mL/hr at 09/13/17 0849   . finasteride  5 mg Oral Daily  . levothyroxine  100 mcg Oral QAC breakfast  . metoprolol succinate  25 mg Oral Daily  . multivitamin with minerals  1 tablet Oral Daily  . pneumococcal 23 valent vaccine  0.5 mL Intramuscular Tomorrow-1000  . polyethylene glycol  17 g Oral Daily  . rOPINIRole  0.5 mg Oral TID WC & HS  . sodium chloride flush  3 mL Intravenous Q12H  . tuberculin  5 Units Intradermal Once  . warfarin  4 mg Oral ONCE-1800  . Warfarin - Pharmacist Dosing Inpatient   Does not apply q1800   acetaminophen **OR** acetaminophen, alum & mag hydroxide-simeth, Camphor-Phenol, HYDROcodone-acetaminophen, ondansetron **OR** ondansetron (ZOFRAN) IV  Assessment/ Plan:  66 y.o. Caucasian male With Parkinson's, hypertension, hypothyroidism, hyperlipidemia, atrial fibrillation requiring anticoagulation with warfarin.  He was admitted on 11/10 status post fall and was found to have  acute renal failure and rhabdomyolysis  1.  Acute renal failure.  Baseline creatinine 0.87 2.  Rhabdomyolysis 3.  Hematuria  Acute renal failure is likely secondary to ATN and rhabdomyolysis.  -Urine output did increase slightly to 420 cc over the  preceding 24 hours however this remains less than hoped for.  Therefore we will consult with vascular surgery to place a PermCath.  Patient was seen and evaluated during hemodialysis today which appears to be tolerating well.  Continue to monitor renal parameters as well as urine output daily for now.  Rhabdomyolysis has significantly improved.  Okay to discontinue IV fluids.    LOS: [place], Ramsha Lonigro 11/20/201811:48 AM  [facility] [place], [place]

## 2017-09-13 NOTE — Progress Notes (Signed)
HD initiated via R Fem cath without issue. No heparin tx. Phos sent to lab as ordered

## 2017-09-13 NOTE — Progress Notes (Signed)
Pre hd assessment  

## 2017-09-13 NOTE — Progress Notes (Signed)
Patient ID: Douglas Corrente., male   DOB: [birth year], 66 y.o.   MRN: [id number]  [facility] PROGRESS NOTE  Douglas Corrente. YWV:[id number] DOB: [birth year] DOA: 09/03/2017 PCP: [name], MD  HPI/Subjective: Patient awaiting hemodialysis sister at bedside  Objective: Vitals:   09/13/17 1307 09/13/17 1329  BP: (!) 149/96 (!) 141/85  Pulse: 94 87  Resp: (!) 27 (!) 22  Temp: 98.2 F (36.8 C) (!) 97.4 F (36.3 C)  SpO2: 98% 98%    Filed Weights   09/13/17 0500 09/13/17 1027 09/13/17 1307  Weight: 200 lb 9.6 oz (91 kg) 200 lb 9.9 oz (91 kg) 198 lb 6.6 oz (90 kg)    ROS: Review of Systems  Constitutional: Negative for chills and fever.  Eyes: Negative for blurred vision.  Respiratory: Negative for cough and shortness of breath.   Cardiovascular: Negative for chest pain.  Gastrointestinal: Negative for abdominal pain, constipation, diarrhea, nausea and vomiting.  Genitourinary: Negative for dysuria.  Musculoskeletal: Negative for back pain and joint pain.  Neurological: Positive for tremors. Negative for dizziness and headaches.   Exam: Physical Exam  HENT:  Nose: No mucosal edema.  Mouth/Throat: No oropharyngeal exudate or posterior oropharyngeal edema.  Eyes: Conjunctivae, EOM and lids are normal. Pupils are equal, round, and reactive to light.  Neck: No JVD present. Carotid bruit is not present. No edema present. No thyroid mass and no thyromegaly present.  Cardiovascular: S1 normal and S2 normal. Exam reveals no gallop.  No murmur heard. Pulses:      Dorsalis pedis pulses are 2+ on the right side, and 2+ on the left side.  Respiratory: No respiratory distress. He has decreased breath sounds in the left lower field. He has no wheezes. He has no rhonchi. He has no rales.  GI: Soft. Bowel sounds are normal. There is no tenderness.  Musculoskeletal:       Right shoulder: He exhibits no swelling.  Lymphadenopathy:    He has no cervical adenopathy.   Neurological: He is alert. No cranial nerve deficit.  Skin: Skin is warm. No rash noted. Nails show no clubbing.  Psychiatric: He has a normal mood and affect.      Data Reviewed: Basic Metabolic Panel: Recent Labs  Lab 09/07/17 0508 09/08/17 0425 09/09/17 0339 09/10/17 0405 09/11/17 0420 09/13/17 0515 09/13/17 0920  NA 133* 136 133* 136 136 134*  --   K 4.1 5.0 4.6 4.7 4.2 4.5  --   CL 100* 100* 96* 99* 99* 98*  --   CO2 24 25 26 29 28 28   --   GLUCOSE 105* 90 91 93 96 95  --   BUN 81* 68* 52* 38* 29* 42*  --   CREATININE 7.49* 6.69* 6.27* 5.29* 4.92* 6.03*  --   CALCIUM 8.2* 8.1* 7.7* 7.9* 8.1* 8.1*  --   PHOS 5.3*  --   --   --  3.7 4.8* 5.1*   CBC: Recent Labs  Lab 09/07/17 0508 09/08/17 0425 09/09/17 0339 09/12/17 0355  WBC 9.5 9.2 9.9 10.3  HGB 11.7* 12.2* 11.8* 11.2*  HCT 34.7* 37.1* 35.2* 33.3*  MCV 90.8 91.1 91.4 91.3  PLT 143* 150 134* 164    Scheduled Meds: . finasteride  5 mg Oral Daily  . levothyroxine  100 mcg Oral QAC breakfast  . metoprolol succinate  25 mg Oral Daily  . multivitamin with minerals  1 tablet Oral Daily  . pneumococcal 23 valent vaccine  0.5 mL  Intramuscular Tomorrow-1000  . polyethylene glycol  17 g Oral Daily  . rOPINIRole  0.5 mg Oral TID WC & HS  . sodium chloride flush  3 mL Intravenous Q12H  . tuberculin  5 Units Intradermal Once  . warfarin  4 mg Oral ONCE-1800  . Warfarin - Pharmacist Dosing Inpatient   Does not apply q1800   Continuous Infusions:   Assessment/Plan:  1. Acute kidney injury felt to be due to ATN, hemodialysis hemodialysis again today plan for PermCath 2. Acute rhabdomyolysis.  Likely from falls and statin medication.   Continue IV fluid hydration at lower rate , CPK normal 3. BPH.  Continue finasteride.  With Parkinson's Flomax is likely not a good medication for this patient.  PSA normal range.  Patient is noted to have some hematuria renal ultrasound shows no pathology likely related to chronic  anticoagulation will need outpatient urology follow-up 4. Hypothyroidism unspecified on levothyroxine.  TSH normal range. 5. Atrial fibrillation history rate control with metoprolol.  Continue Coumadin and pharmacy is dosing. 6. Physical therapy evaluation: SNF. 7. Parkinson's disease on Requip 8. Hyperlipidemia unspecified.  Stopped simvastatin due to severe rhabdomyolysis    Code Status:     Code Status Orders  (From admission, onward)        Start     Ordered   09/04/17 0042  Full code  Continuous     09/04/17 0041    Code Status History    Date Active Date Inactive Code Status Order ID Comments User Context   06/01/2017 20:14 06/03/2017 15:06 Full Code [id number]  Loletha Grayer, MD ED     Family Communication: Patient's sister. Disposition Plan: to be determined.  Consultants:  Nephrology  Time spent: 33 minutes, case discussed with Dr. Candiss Norse, RN and CM.  Posey Pronto [facility]

## 2017-09-13 NOTE — Progress Notes (Signed)
HD completed without issue. Patient tolerated well.  

## 2017-09-13 NOTE — Progress Notes (Signed)
Physical Therapy Treatment Patient Details Name: Douglas Silva. MRN: [id number] DOB: Jun 01, 1951 Today's Date: 09/13/2017    History of Present Illness Pt is a 66 y.o. male presenting to hospital s/p fall with back pain (pt walking with walker and lost footing d/t chronic movement issues with Parkinson's disease).  09/05/17 temporary R femoral HD catheter placed d/t acute kidney injury.  PMH includes a-fib, Parkinson's disease, htn, CAD.    PT Comments    At beginning of session pt reporting feeling "frustrated" but did not want to talk about it (pt eventually reporting he was frustrated that every time he bent his R elbow his IV started beeping; nurse present upon PT arriving addressing pt's peripheral IV).  Overall pt tolerated allowable LE and UE ex's well in bed.  L heel-cord appearing tighter than R so performed stretching to improve ROM.   D/t R temporary femoral HD catheter, (per hospital protocol) not able to perform OOB mobility.  Will continue to focus on strengthening/ex's in bed until temporary femoral HD catheter is removed and pt is cleared to get OOB.   Follow Up Recommendations  SNF     Equipment Recommendations  Rolling walker with 5" wheels    Recommendations for Other Services OT consult     Precautions / Restrictions Precautions Precautions: Fall Precaution Comments: R temporary femoral HD catheter restrictions;  Aspiration Restrictions Weight Bearing Restrictions: No    Mobility  Bed Mobility               General bed mobility comments: Deferred d/t temporary R femoral HD catheter restrictions  Transfers                 General transfer comment: Deferred d/t temporary R femoral HD catheter restrictions  Ambulation/Gait             General Gait Details: Deferred d/t temporary R femoral HD catheter restrictions   Stairs            Wheelchair Mobility    Modified Rankin (Stroke Patients Only)       Balance                                            Cognition Arousal/Alertness: Awake/alert Behavior During Therapy: Flat affect Overall Cognitive Status: Within Functional Limits for tasks assessed                                        Exercises Total Joint Exercises Ankle Circles/Pumps: AROM;Strengthening;Both;Supine(10 reps x2) Quad Sets: AROM;Strengthening;Both;Supine(10 reps x2; vc's and tactile cues required for this exercise) Short Arc Quad: AROM;Strengthening;Left;Supine(10 reps x2) Heel Slides: AROM;Strengthening;Left;Supine(10 reps x2) Hip ABduction/ADduction: AAROM;AROM;Strengthening;Left;Supine(10 reps x2) Straight Leg Raises: AAROM;Strengthening;Left;10 reps;Supine(10 reps x2) General Exercises - Upper Extremity Shoulder Flexion: AROM;Strengthening;Both;Supine(10 reps x2) Elbow Flexion: AROM;Strengthening;Left;10 reps;Supine(10 reps x2; Deferred R UE d/t peripheral IV placement) Elbow Extension: AROM;Strengthening;Left;10 reps;Supine(10 reps x2; deferred R UE d/t peripheral IV placement) Other Exercises Other Exercises: B hand grip squeezes 10 reps x2 (supine in bed) Other Exercises: 2x30 seconds B heelcord stretch (supine in bed)    General Comments General comments (skin integrity, edema, etc.): R temporary femoral HD catheter in place.  Nursing cleared pt for participation in physical therapy.  Pt agreeable to PT session.  Pertinent Vitals/Pain Pain Assessment: Faces Faces Pain Scale: Hurts a little bit Pain Location: stiff neck Pain Descriptors / Indicators: Sore Pain Intervention(s): Limited activity within patient's tolerance;Monitored during session;Repositioned  Vitals (HR and O2 on room air) stable and WFL throughout treatment session.    Home Living                      Prior Function            PT Goals (current goals can now be found in the care plan section) Acute Rehab PT Goals Patient Stated Goal: to be able to  walk again PT Goal Formulation: With patient Time For Goal Achievement: 09/18/17 Potential to Achieve Goals: Fair Progress towards PT goals: Progressing toward goals(with strengthening)    Frequency    Min 2X/week      PT Plan Current plan remains appropriate    Co-evaluation              AM-PAC PT "6 Clicks" Daily Activity  Outcome Measure  Difficulty turning over in bed (including adjusting bedclothes, sheets and blankets)?: Unable Difficulty moving from lying on back to sitting on the side of the bed? : Unable Difficulty sitting down on and standing up from a chair with arms (e.g., wheelchair, bedside commode, etc,.)?: Unable Help needed moving to and from a bed to chair (including a wheelchair)?: A Lot Help needed walking in hospital room?: Total Help needed climbing 3-5 steps with a railing? : Total 6 Click Score: 7    End of Session   Activity Tolerance: Patient tolerated treatment well Patient left: in bed;with call bell/phone within reach;with bed alarm set;with family/visitor present Nurse Communication: Mobility status;Precautions PT Visit Diagnosis: Other abnormalities of gait and mobility (R26.89);Repeated falls (R29.6);Muscle weakness (generalized) (M62.81)     Time: 1660-6301 PT Time Calculation (min) (ACUTE ONLY): 27 min  Charges:  $Therapeutic Exercise: 23-37 mins                    G CodesLeitha Bleak, PT 09/13/17, 9:48 AM [phone]

## 2017-09-13 NOTE — Progress Notes (Signed)
[facility]  Daily Progress Note   Subjective  - 8 Days Post-Op  Dialysis catheter has been functioning well.  Patient denies pain at the insertion site.  Objective Vitals:   09/13/17 1300 09/13/17 1307 09/13/17 1329 09/13/17 1438  BP: (!) 149/96 (!) 149/96 (!) 141/85 130/77  Pulse: 79 94 87 94  Resp: (!) 24 (!) 27 (!) 22   Temp:  98.2 F (36.8 C) (!) 97.4 F (36.3 C)   TempSrc:  Oral Oral   SpO2: 98% 98% 98%   Weight:  90 kg (198 lb 6.6 oz)    Height:        Intake/Output Summary (Last 24 hours) at 09/13/2017 1833 Last data filed at 09/13/2017 1623 Gross per 24 hour  Intake 914.33 ml  Output 1843 ml  Net -928.67 ml    PULM  Normal effort , no use of accessory muscles CV  No JVD, RRR Abd      No distended, nontender VASC  temporary right femoral dialysis catheter clean dry and intact  Laboratory CBC    Component Value Date/Time   WBC 10.3 09/12/2017 0355   HGB 11.2 (L) 09/12/2017 0355   HCT 33.3 (L) 09/12/2017 0355   PLT 164 09/12/2017 0355    BMET    Component Value Date/Time   NA 134 (L) 09/13/2017 0515   K 4.5 09/13/2017 0515   CL 98 (L) 09/13/2017 0515   CO2 28 09/13/2017 0515   GLUCOSE 95 09/13/2017 0515   BUN 42 (H) 09/13/2017 0515   CREATININE 6.03 (H) 09/13/2017 0515   CALCIUM 8.1 (L) 09/13/2017 0515   GFRNONAA 9 (L) 09/13/2017 0515   GFRAA 10 (L) 09/13/2017 0515    Assessment/Planning:   Acute on chronic renal insufficiency.  Currently the patient is maintained with a temporary dialysis catheter.  Plans will be made for placement of a tunneled dialysis catheter however currently the patient is receiving Coumadin daily and his INR is 2.77.  Heparin drip should be initiated and once the INR is been corrected we can move forward with a tunneled catheter insertion likely next week    Hortencia Pilar  09/13/2017, 6:33 PM

## 2017-09-13 NOTE — Care Management (Signed)
Spoke with Elvera Bicker, dialysis representative, Mr. Blizzard has a chair  at [facility] 10:45am starting on Tuesday of next week.  Shelbie Ammons RN MSN CCM Care Management [phone]

## 2017-09-13 NOTE — Progress Notes (Signed)
ANTICOAGULATION CONSULT NOTE - Follow Up Consult  Pharmacy Consult for Warfarin Indication: atrial fibrillation  No Known Allergies  Patient Measurements: Height: 5\' 11"  (180.3 cm) Weight: 200 lb 9.9 oz (91 kg) IBW/kg (Calculated) : 75.3 Heparin Dosing Weight: na  Vital Signs: Temp: 98.1 F (36.7 C) (11/20 1027) Temp Source: Oral (11/20 1027) BP: 129/86 (11/20 1029) Pulse Rate: 87 (11/20 1029)  Labs: Recent Labs    09/11/17 0420 09/12/17 0355 09/13/17 0515 09/13/17 0516  HGB  --  11.2*  --   --   HCT  --  33.3*  --   --   PLT  --  164  --   --   LABPROT 30.3* 31.0*  --  28.6*  INR 2.93 3.01  --  2.72  CREATININE 4.92*  --  6.03*  --   CKTOTAL 463*  --   --  177    Estimated Creatinine Clearance: 14.1 mL/min (A) (by C-G formula based on SCr of 6.03 mg/dL (H)).  Assessment: Patient admitted for fall, is chronically anticoagulated w/ warfarin for afib. CT head with no acute intracranial hemorrhage. Pharmacist spoke with pt about warfarin who states he takes one whole tab daily of pink/peach color tablet (could not state mg strength so asked color of tablet); home dose listed as 5 mg daily on PTA med list.   Date    INR      Dose 11/11   3.05     Held 11/12   2.68     5 mg 11/13   2.29     5 mg 11/14   2.02     6 mg 11/15   2.03     6 mg 11/16   2.44     4 mg 11/17   3.12     3 mg 11/18   2.93     4 mg 11/19   3.01     3 mg 11/20   2.72   Goal of Therapy:  INR 2-3 Monitor platelets by anticoagulation protocol: Yes   Plan:  INR therapeutic but continues to have daily variation. Will give Warfarin 4 mg PO x 1 tonight. Will need to continue with daily INR checks until INR stablizes. Follow up INR in AM.  Paulina Fusi, PharmD, BCPS 09/13/2017 11:12 AM

## 2017-09-13 NOTE — Clinical Social Work Note (Signed)
Estill Bamberg, the Dialysis Coordinator, informed CSW that patient is still being considered AKI and that Dr. Holley Raring had told her that patient's kidney function might improve enough over the weekend to a point that patient may not need outpatient dialysis. CSW will continue to follow. Shela Leff MSW,LCSW [phone]

## 2017-09-14 LAB — RENAL FUNCTION PANEL
ANION GAP: 10 (ref 5–15)
Albumin: 2.3 g/dL — ABNORMAL LOW (ref 3.5–5.0)
Albumin: 2.4 g/dL — ABNORMAL LOW (ref 3.5–5.0)
Anion gap: 9 (ref 5–15)
BUN: 37 mg/dL — AB (ref 6–20)
BUN: 44 mg/dL — AB (ref 6–20)
CHLORIDE: 99 mmol/L — AB (ref 101–111)
CHLORIDE: 96 mmol/L — AB (ref 101–111)
CO2: 29 mmol/L (ref 22–32)
CO2: 28 mmol/L (ref 22–32)
CREATININE: 5.95 mg/dL — AB (ref 0.61–1.24)
CREATININE: 6.2 mg/dL — AB (ref 0.61–1.24)
Calcium: 8.1 mg/dL — ABNORMAL LOW (ref 8.9–10.3)
Calcium: 8 mg/dL — ABNORMAL LOW (ref 8.9–10.3)
GFR calc Af Amer: 10 mL/min — ABNORMAL LOW (ref >60)
GFR calc Af Amer: 10 mL/min — ABNORMAL LOW (ref >60)
GFR calc non Af Amer: 8 mL/min — ABNORMAL LOW (ref >60)
GFR, EST NON AFRICAN AMERICAN: 9 mL/min — AB (ref >60)
GLUCOSE: 99 mg/dL (ref 65–99)
Glucose, Bld: 114 mg/dL — ABNORMAL HIGH (ref 65–99)
PHOSPHORUS: 4.8 mg/dL — AB (ref 2.5–4.6)
POTASSIUM: 4.6 mmol/L (ref 3.5–5.1)
Phosphorus: 4.9 mg/dL — ABNORMAL HIGH (ref 2.5–4.6)
Potassium: 4.4 mmol/L (ref 3.5–5.1)
Sodium: 137 mmol/L (ref 135–145)
Sodium: 134 mmol/L — ABNORMAL LOW (ref 135–145)

## 2017-09-14 LAB — PROTIME-INR
INR: 2.42
PROTHROMBIN TIME: 26.1 s — AB (ref 11.4–15.2)

## 2017-09-14 MED ORDER — WARFARIN SODIUM 4 MG PO TABS
4.0000 mg | ORAL_TABLET | Freq: Once | ORAL | Status: AC
  Administered 2017-09-14: 4 mg via ORAL
  Filled 2017-09-14: qty 1

## 2017-09-14 NOTE — Progress Notes (Signed)
HD tx end  

## 2017-09-14 NOTE — Progress Notes (Signed)
Pre HD assessment  

## 2017-09-14 NOTE — Care Management (Signed)
Patient urine output increasing.  Patient may no longer require outpatient HD.  However if he does require outpatient HD he has a time at [facility] [facility] at 10:45

## 2017-09-14 NOTE — Progress Notes (Signed)
ANTICOAGULATION CONSULT NOTE - Follow Up Consult  Pharmacy Consult for Warfarin Indication: atrial fibrillation  No Known Allergies  Patient Measurements: Height: 5\' 11"  (180.3 cm) Weight: 199 lb 15.3 oz (90.7 kg) IBW/kg (Calculated) : 75.3 Heparin Dosing Weight: na  Vital Signs: Temp: 99.1 F (37.3 C) (11/21 1045) Temp Source: Oral (11/21 1045) BP: 108/72 (11/21 1130) Pulse Rate: 77 (11/21 1130)  Labs: Recent Labs    09/12/17 0355 09/13/17 0515 09/13/17 0516 09/14/17 0519  HGB 11.2*  --   --   --   HCT 33.3*  --   --   --   PLT 164  --   --   --   LABPROT 31.0*  --  28.6* 26.1*  INR 3.01  --  2.72 2.42  CREATININE  --  6.03*  --  5.95*  CKTOTAL  --   --  177  --     Estimated Creatinine Clearance: 14.3 mL/min (A) (by C-G formula based on SCr of 5.95 mg/dL (H)).  Assessment: Patient admitted for fall, is chronically anticoagulated w/ warfarin for afib. CT head with no acute intracranial hemorrhage. Pharmacist spoke with pt about warfarin who states he takes one whole tab daily of pink/peach color tablet (could not state mg strength so asked color of tablet); home dose listed as 5 mg daily on PTA med list.   Date    INR      Dose 11/11   3.05     Held 11/12   2.68     5 mg 11/13   2.29     5 mg 11/14   2.02     6 mg 11/15   2.03     6 mg 11/16   2.44     4 mg 11/17   3.12     3 mg 11/18   2.93     4 mg 11/19   3.01     3 mg 11/20   2.72     Dose held for possible permcath placement 11/21   2.42  Goal of Therapy:  INR 2-3 Monitor platelets by anticoagulation protocol: Yes   Plan:  Patient did not receive Warfarin 4mg  on 11/20 due to possible permcath placement. Does not appear that he will be getting this now so will continue with daily Warfarin dosing. INR therapeutic but continues to have daily variation. Will give Warfarin 4 mg PO x 1 tonight. Will need to continue with daily INR checks until INR stablizes. Follow up INR in AM.  Paulina Fusi, PharmD,  BCPS 09/14/2017 11:50 AM

## 2017-09-14 NOTE — Progress Notes (Signed)
[facility], [place] 09/14/17  Subjective:  Urine output does appear to be increasing. Urine output was 1.5 L over the preceding 24 hours. He is seen and evaluated during hemodialysis today. He appears to be tolerating well.  Objective:  Vital signs in last 24 hours:  Temp:  [97.4 F (36.3 C)-99.1 F (37.3 C)] 99.1 F (37.3 C) (11/21 1045) Pulse Rate:  [70-103] 94 (11/21 1100) Resp:  [18-27] 19 (11/21 1100) BP: (117-150)/(75-105) 118/75 (11/21 1047) SpO2:  [96 %-100 %] 96 % (11/21 1100) Weight:  [89.4 kg (197 lb 3.2 oz)-90.7 kg (199 lb 15.3 oz)] 90.7 kg (199 lb 15.3 oz) (11/21 1045)  Weight change: 1.7 kg (3 lb 12 oz) Filed Weights   09/13/17 1307 09/14/17 0500 09/14/17 1045  Weight: 90 kg (198 lb 6.6 oz) 89.4 kg (197 lb 3.2 oz) 90.7 kg (199 lb 15.3 oz)    Intake/Output:    Intake/Output Summary (Last 24 hours) at 09/14/2017 1123 Last data filed at 09/14/2017 1018 Gross per 24 hour  Intake 690 ml  Output 2473 ml  Net -1783 ml     Physical Exam: General:  No acute distress, laying in the bed  HEENT  anicteric, moist oral mucous membranes  Neck  supple  Pulm/lungs  scattered rhonchi, normal effort  CVS/Heart  S1S2 no rubs  Abdomen:   Soft, nontender  Extremities:  Trace edema  Neurologic:  Alert, oriented, Parkinson's tremors  Skin:  No acute rashes  Access:  Temporary dialysis catheter left femoral vein       Basic Metabolic Panel:  Recent Labs  Lab 09/09/17 0339 09/10/17 0405 09/11/17 0420 09/13/17 0515 09/13/17 0920 09/14/17 0519  NA 133* 136 136 134*  --  137  K 4.6 4.7 4.2 4.5  --  4.4  CL 96* 99* 99* 98*  --  99*  CO2 26 29 28 28   --  29  GLUCOSE 91 93 96 95  --  99  BUN 52* 38* 29* 42*  --  37*  CREATININE 6.27* 5.29* 4.92* 6.03*  --  5.95*  CALCIUM 7.7* 7.9* 8.1* 8.1*  --  8.1*  PHOS  --   --  3.7 4.8* 5.1* 4.9*     CBC: Recent Labs  Lab 09/08/17 0425 09/09/17 0339 09/12/17 0355  WBC 9.2 9.9 10.3  HGB  12.2* 11.8* 11.2*  HCT 37.1* 35.2* 33.3*  MCV 91.1 91.4 91.3  PLT 150 134* 164      Lab Results  Component Value Date   HEPBSAG Negative 09/06/2017      Microbiology:  No results found for this or any previous visit (from the past 240 hour(s)).  Coagulation Studies: Recent Labs    09/12/17 0355 09/13/17 0516 09/14/17 0519  LABPROT 31.0* 28.6* 26.1*  INR 3.01 2.72 2.42    Urinalysis: No results for input(s): COLORURINE, LABSPEC, PHURINE, GLUCOSEU, HGBUR, BILIRUBINUR, KETONESUR, PROTEINUR, UROBILINOGEN, NITRITE, LEUKOCYTESUR in the last 72 hours.  Invalid input(s): APPERANCEUR    Imaging: No results found.   Medications:    . finasteride  5 mg Oral Daily  . levothyroxine  100 mcg Oral QAC breakfast  . metoprolol succinate  25 mg Oral Daily  . multivitamin with minerals  1 tablet Oral Daily  . pneumococcal 23 valent vaccine  0.5 mL Intramuscular Tomorrow-1000  . polyethylene glycol  17 g Oral Daily  . rOPINIRole  0.5 mg Oral TID WC & HS  . sodium chloride flush  3 mL Intravenous Q12H  .  tuberculin  5 Units Intradermal Once  . warfarin  4 mg Oral ONCE-1800  . Warfarin - Pharmacist Dosing Inpatient   Does not apply q1800   acetaminophen **OR** acetaminophen, alum & mag hydroxide-simeth, Camphor-Phenol, HYDROcodone-acetaminophen, ondansetron **OR** ondansetron (ZOFRAN) IV  Assessment/ Plan:  66 y.o. Caucasian male With Parkinson's, hypertension, hypothyroidism, hyperlipidemia, atrial fibrillation requiring anticoagulation with warfarin.  He was admitted on 11/10 status post fall and was found to have  acute renal failure and rhabdomyolysis  1.  Acute renal failure.  Baseline creatinine 0.87 2.  Rhabdomyolysis 3.  Hematuria  Acute renal failure is likely secondary to ATN and rhabdomyolysis.  -Urine output does appear to be improving.  Urine output over the preceding 24 hours was 1.5 L.  However creatinine remains high at 5.95.  Patient is undergoing  hemodialysis today but we are limiting ultrafiltration.  We suspect that this may be the patient's last hemodialysis as his urine output is steadily increasing.  Therefore we will cancel plans for PermCath placement at this time.  Further plan as patient progresses.    LOS: Landen, Conchita Truxillo 11/21/201811:23 AM  [place], [place]

## 2017-09-14 NOTE — Progress Notes (Signed)
PPD read to left forearm 0 mm= negative reading on 09/14/17 at 1629.

## 2017-09-14 NOTE — Progress Notes (Signed)
[facility] [place] NAME: Douglas Silva    MR#:  [id number]  DATE OF BIRTH:  [birth year]  SUBJECTIVE:   Patient was seen at hemodialysis.  Denies any complaints.   REVIEW OF SYSTEMS:   Review of Systems  Constitutional: Negative for chills, fever and weight loss.  HENT: Negative for ear discharge, ear pain and nosebleeds.   Eyes: Negative for blurred vision, pain and discharge.  Respiratory: Negative for sputum production, shortness of breath, wheezing and stridor.   Cardiovascular: Negative for chest pain, palpitations, orthopnea and PND.  Gastrointestinal: Negative for abdominal pain, diarrhea, nausea and vomiting.  Genitourinary: Negative for frequency and urgency.  Musculoskeletal: Negative for back pain and joint pain.  Neurological: Positive for weakness. Negative for sensory change, speech change and focal weakness.  Psychiatric/Behavioral: Negative for depression and hallucinations. The patient is not nervous/anxious.    Tolerating Diet: Yes Tolerating PT: Recommends rehab  DRUG ALLERGIES:  No Known Allergies  VITALS:  Blood pressure (!) 141/85, pulse 100, temperature 97.7 F (36.5 C), temperature source Oral, resp. rate 20, height 5\' 11"  (1.803 m), weight 90.2 kg (198 lb 13.7 oz), SpO2 97 %.  PHYSICAL EXAMINATION:   Physical Exam  GENERAL:  66 y.o.-year-old patient lying in the bed with no acute distress.  EYES: Pupils equal, round, reactive to light and accommodation. No scleral icterus. Extraocular muscles intact.  HEENT: Head atraumatic, normocephalic. Oropharynx and nasopharynx clear.  NECK:  Supple, no jugular venous distention. No thyroid enlargement, no tenderness.  LUNGS: Normal breath sounds bilaterally, no wheezing, rales, rhonchi. No use of accessory muscles of respiration.  CARDIOVASCULAR: S1, S2 normal. No murmurs, rubs, or gallops.  ABDOMEN: Soft, nontender, nondistended. Bowel sounds present. No  organomegaly or mass.  EXTREMITIES: No cyanosis, clubbing or edema b/l.   Temporary dialysis catheter in the groin NEUROLOGIC: Cranial nerves II through XII are intact. No focal Motor or sensory deficits b/l.  Resting tremors PSYCHIATRIC:  patient is alert and oriented x 3.  SKIN: No obvious rash, lesion, or ulcer.   LABORATORY PANEL:  CBC Recent Labs  Lab 09/12/17 0355  WBC 10.3  HGB 11.2*  HCT 33.3*  PLT 164    Chemistries  Recent Labs  Lab 09/14/17 1129  NA 134*  K 4.6  CL 96*  CO2 28  GLUCOSE 114*  BUN 44*  CREATININE 6.20*  CALCIUM 8.0*   Cardiac Enzymes No results for input(s): TROPONINI in the last 168 hours. RADIOLOGY:  No results found. ASSESSMENT AND PLAN:  Lanny Lipkin  is a 66 y.o. male with a known history per below presented to the emergency room via EMS for mechanical fall, occurred at his home as patient was using his walker, tripped and fell backwards resulting in back pain acutely, no loss of consciousness, ER workup noted for acute kidney injury with creatinine of 2 with baseline creatinine norma   1. Acute kidney injury felt to be due to ATN -hemodialysis again today. - plan for PermCath on hold since pt making good UOP---1.5 liters over 24hours  2. Acute rhabdomyolysis.  Likely from falls and statin medication. -recieved IV fluid hydration at lower rate , CPK normal  3. BPH.  Continue finasteride.  With Parkinson's Flomax is likely not a good medication for this patient.  PSA normal range.  Patient is noted to have some hematuria renal ultrasound shows no pathology likely related to chronic anticoagulation will need outpatient urology follow-up  4. Hypothyroidism unspecified  on levothyroxine.  TSH normal range.  5. Atrial fibrillation history rate control with metoprolol.  Continue Coumadin and pharmacy is dosing  6. Physical therapy evaluation: SNF.  7. Parkinson's disease on Requip  8. Hyperlipidemia unspecified.  Stopped simvastatin  due to severe rhabdomyolysis  Overall improving.  Patient does not require any more hemodialysis temporary catheter will be removed and will work on discharge planning to rehab.  Case discussed with Care Management/Social Worker. Management plans discussed with the patient, family and they are in agreement.  CODE STATUS: full  DVT Prophylaxis: *heparin*  TOTAL TIME TAKING CARE OF THIS PATIENT: *30* minutes.  >50% time spent on counselling and coordination of care  POSSIBLE D/C IN 1-2 DAYS, DEPENDING ON CLINICAL CONDITION.  Note: This dictation was prepared with Dragon dictation along with smaller phrase technology. Any transcriptional errors that result from this process are unintentional.  Fritzi Mandes M.D on 09/14/2017 at 7:44 PM  Between 7am to 6pm - Pager - [phone]  After 6pm go to [email or web address] - password [email or web address]  Office  [phone]  CC: Primary care physician; [name], MD

## 2017-09-14 NOTE — Progress Notes (Signed)
Post HD assessment  

## 2017-09-14 NOTE — Progress Notes (Signed)
HD tx start 

## 2017-09-14 NOTE — Progress Notes (Signed)
PT Cancellation Note  Patient Details Name: Douglas Silva. MRN: [id number] DOB: [birth year]   Cancelled Treatment:    Reason Eval/Treat Not Completed: Patient at procedure or test/unavailable.  Pt currently off floor at dialysis.  Will re-attempt PT treatment at a later date/time.  Leitha Bleak, PT 09/14/17, 11:28 AM [phone]

## 2017-09-15 LAB — BASIC METABOLIC PANEL
Anion gap: 9 (ref 5–15)
BUN: 35 mg/dL — AB (ref 6–20)
CO2: 28 mmol/L (ref 22–32)
CREATININE: 5.34 mg/dL — AB (ref 0.61–1.24)
Calcium: 8.2 mg/dL — ABNORMAL LOW (ref 8.9–10.3)
Chloride: 98 mmol/L — ABNORMAL LOW (ref 101–111)
GFR, EST AFRICAN AMERICAN: 12 mL/min — AB (ref >60)
GFR, EST NON AFRICAN AMERICAN: 10 mL/min — AB (ref >60)
Glucose, Bld: 128 mg/dL — ABNORMAL HIGH (ref 65–99)
Potassium: 4.5 mmol/L (ref 3.5–5.1)
SODIUM: 135 mmol/L (ref 135–145)

## 2017-09-15 LAB — PROTIME-INR
INR: 2.15
PROTHROMBIN TIME: 23.8 s — AB (ref 11.4–15.2)

## 2017-09-15 MED ORDER — WARFARIN SODIUM 6 MG PO TABS
6.0000 mg | ORAL_TABLET | Freq: Once | ORAL | Status: DC
  Filled 2017-09-15: qty 1

## 2017-09-15 NOTE — Progress Notes (Signed)
ANTICOAGULATION CONSULT NOTE - Follow Up Consult  Pharmacy Consult for Warfarin Indication: atrial fibrillation  No Known Allergies  Patient Measurements: Height: 5\' 11"  (180.3 cm) Weight: 198 lb 13.7 oz (90.2 kg) IBW/kg (Calculated) : 75.3 Heparin Dosing Weight: na  Vital Signs: Temp: 98.2 F (36.8 C) (11/22 0347) Temp Source: Oral (11/22 0347) BP: 136/95 (11/22 0347) Pulse Rate: 95 (11/22 0347)  Labs: Recent Labs    09/13/17 0515 09/13/17 0516 09/14/17 0519 09/14/17 1129 09/15/17 0536  LABPROT  --  28.6* 26.1*  --  23.8*  INR  --  2.72 2.42  --  2.15  CREATININE 6.03*  --  5.95* 6.20*  --   CKTOTAL  --  177  --   --   --     Estimated Creatinine Clearance: 12.7 mL/min (A) (by C-G formula based on SCr of 6.2 mg/dL (H)).  Assessment: Patient admitted for fall, is chronically anticoagulated w/ warfarin for afib. CT head with no acute intracranial hemorrhage. Pharmacist spoke with pt about warfarin who states he takes one whole tab daily of pink/peach color tablet (could not state mg strength so asked color of tablet); home dose listed as 5 mg daily on PTA med list.   Date    INR      Dose 11/11   3.05     Held 11/12   2.68     5 mg 11/13   2.29     5 mg 11/14   2.02     6 mg 11/15   2.03     6 mg 11/16   2.44     4 mg 11/17   3.12     3 mg 11/18   2.93     4 mg 11/19   3.01     3 mg 11/20   2.72     Dose held for possible permcath placement 11/21   2.42    4 mg  11/22  2.15        Goal of Therapy:  INR 2-3 Monitor platelets by anticoagulation protocol: Yes   Plan:  INR therapeutic but continues to have daily variation. Will give Warfarin 6 mg PO x 1 tonight. Will need to continue with daily INR checks until INR stablizes. Follow up INR in AM.  Larene Beach, PharmD  09/15/2017 8:32 AM

## 2017-09-15 NOTE — Progress Notes (Signed)
[facility], [place] 09/15/17  Subjective:  Urine output did drop a bit to 785 cc over the preceding 24 hours. Patient did undergo dialysis yesterday.   Objective:  Vital signs in last 24 hours:  Temp:  [97.7 F (36.5 C)-99.1 F (37.3 C)] 98.2 F (36.8 C) (11/22 0347) Pulse Rate:  [73-100] 95 (11/22 0347) Resp:  [16-28] 20 (11/22 0347) BP: (107-142)/(63-95) 136/95 (11/22 0347) SpO2:  [93 %-98 %] 96 % (11/22 0347) Weight:  [90.2 kg (198 lb 13.7 oz)-90.7 kg (199 lb 15.3 oz)] 90.2 kg (198 lb 13.7 oz) (11/21 1414)  Weight change: -0.3 kg (-10.6 oz) Filed Weights   09/14/17 0500 09/14/17 1045 09/14/17 1414  Weight: 89.4 kg (197 lb 3.2 oz) 90.7 kg (199 lb 15.3 oz) 90.2 kg (198 lb 13.7 oz)    Intake/Output:    Intake/Output Summary (Last 24 hours) at 09/15/2017 1035 Last data filed at 09/15/2017 0951 Gross per 24 hour  Intake 700 ml  Output 685 ml  Net 15 ml     Physical Exam: General:  No acute distress, laying in the bed  HEENT  anicteric, moist oral mucous membranes  Neck  supple  Pulm/lungs  scattered rhonchi, normal effort  CVS/Heart  S1S2 no rubs  Abdomen:   Soft, nontender  Extremities:  Trace edema  Neurologic:  Alert, oriented, Parkinson's tremors  Skin:  No acute rashes  Access:  Temporary dialysis catheter left femoral vein       Basic Metabolic Panel:  Recent Labs  Lab 09/10/17 0405 09/11/17 0420 09/13/17 0515 09/13/17 0920 09/14/17 0519 09/14/17 1129  NA 136 136 134*  --  137 134*  K 4.7 4.2 4.5  --  4.4 4.6  CL 99* 99* 98*  --  99* 96*  CO2 29 28 28   --  29 28  GLUCOSE 93 96 95  --  99 114*  BUN 38* 29* 42*  --  37* 44*  CREATININE 5.29* 4.92* 6.03*  --  5.95* 6.20*  CALCIUM 7.9* 8.1* 8.1*  --  8.1* 8.0*  PHOS  --  3.7 4.8* 5.1* 4.9* 4.8*     CBC: Recent Labs  Lab 09/09/17 0339 09/12/17 0355  WBC 9.9 10.3  HGB 11.8* 11.2*  HCT 35.2* 33.3*  MCV 91.4 91.3  PLT 134* 164      Lab Results  Component  Value Date   HEPBSAG Negative 09/06/2017      Microbiology:  No results found for this or any previous visit (from the past 240 hour(s)).  Coagulation Studies: Recent Labs    09/13/17 0516 09/14/17 0519 09/15/17 0536  LABPROT 28.6* 26.1* 23.8*  INR 2.72 2.42 2.15    Urinalysis: No results for input(s): COLORURINE, LABSPEC, PHURINE, GLUCOSEU, HGBUR, BILIRUBINUR, KETONESUR, PROTEINUR, UROBILINOGEN, NITRITE, LEUKOCYTESUR in the last 72 hours.  Invalid input(s): APPERANCEUR    Imaging: No results found.   Medications:    . finasteride  5 mg Oral Daily  . levothyroxine  100 mcg Oral QAC breakfast  . metoprolol succinate  25 mg Oral Daily  . multivitamin with minerals  1 tablet Oral Daily  . pneumococcal 23 valent vaccine  0.5 mL Intramuscular Tomorrow-1000  . polyethylene glycol  17 g Oral Daily  . rOPINIRole  0.5 mg Oral TID WC & HS  . sodium chloride flush  3 mL Intravenous Q12H  . warfarin  6 mg Oral ONCE-1800  . Warfarin - Pharmacist Dosing Inpatient   Does not apply [phone]  acetaminophen **OR** acetaminophen, alum & mag hydroxide-simeth, Camphor-Phenol, HYDROcodone-acetaminophen, ondansetron **OR** ondansetron (ZOFRAN) IV  Assessment/ Plan:  66 y.o. Caucasian male With Parkinson's, hypertension, hypothyroidism, hyperlipidemia, atrial fibrillation requiring anticoagulation with warfarin.  He was admitted on 11/10 status post fall and was found to have  acute renal failure and rhabdomyolysis  1.  Acute renal failure.  Baseline creatinine 0.87 2.  Rhabdomyolysis 3.  Hematuria  Acute renal failure is likely secondary to ATN and rhabdomyolysis.  -Patient was previously improving however urine output dropped to 785 cc yesterday.  We did not perform any significant ultrafiltration yesterday.  For now we will continue to monitor renal parameters closely.  No urgent indication to restart dialysis.  We have reordered follow-up renal  function testing.    LOS:  12 Westyn Driggers 11/22/201810:35 [place], [place]

## 2017-09-15 NOTE — Progress Notes (Signed)
Douglas Silva at [place] NAME: Douglas Silva    MR#:  [id number]  DATE OF BIRTH:  [birth year]  SUBJECTIVE:    Denies any complaints.   UOP yday >700 REVIEW OF SYSTEMS:   Review of Systems  Constitutional: Negative for chills, fever and weight loss.  HENT: Negative for ear discharge, ear pain and nosebleeds.   Eyes: Negative for blurred vision, pain and discharge.  Respiratory: Negative for sputum production, shortness of breath, wheezing and stridor.   Cardiovascular: Negative for chest pain, palpitations, orthopnea and PND.  Gastrointestinal: Negative for abdominal pain, diarrhea, nausea and vomiting.  Genitourinary: Negative for frequency and urgency.  Musculoskeletal: Negative for back pain and joint pain.  Neurological: Positive for weakness. Negative for sensory change, speech change and focal weakness.  Psychiatric/Behavioral: Negative for depression and hallucinations. The patient is not nervous/anxious.    Tolerating Diet: Yes Tolerating PT: Recommends rehab  DRUG ALLERGIES:  No Known Allergies  VITALS:  Blood pressure (!) 136/95, pulse 95, temperature 98.2 F (36.8 C), temperature source Oral, resp. rate 20, height 5\' 11"  (1.803 m), weight 90.2 kg (198 lb 13.7 oz), SpO2 96 %.  PHYSICAL EXAMINATION:   Physical Exam  GENERAL:  66 y.o.-year-old patient lying in the bed with no acute distress.  EYES: Pupils equal, round, reactive to light and accommodation. No scleral icterus. Extraocular muscles intact.  HEENT: Head atraumatic, normocephalic. Oropharynx and nasopharynx clear.  NECK:  Supple, no jugular venous distention. No thyroid enlargement, no tenderness.  LUNGS: Normal breath sounds bilaterally, no wheezing, rales, rhonchi. No use of accessory muscles of respiration.  CARDIOVASCULAR: S1, S2 normal. No murmurs, rubs, or gallops.  ABDOMEN: Soft, nontender, nondistended. Bowel sounds present. No organomegaly or mass.   EXTREMITIES: No cyanosis, clubbing or edema b/l.   Temporary dialysis catheter in the groin NEUROLOGIC: Cranial nerves II through XII are intact. No focal Motor or sensory deficits b/l.  Resting tremors PSYCHIATRIC:  patient is alert and oriented x 3.  SKIN: No obvious rash, lesion, or ulcer.   LABORATORY PANEL:  CBC Recent Labs  Lab 09/12/17 0355  WBC 10.3  HGB 11.2*  HCT 33.3*  PLT 164    Chemistries  Recent Labs  Lab 09/15/17 1018  NA 135  K 4.5  CL 98*  CO2 28  GLUCOSE 128*  BUN 35*  CREATININE 5.34*  CALCIUM 8.2*   Cardiac Enzymes No results for input(s): TROPONINI in the last 168 hours. RADIOLOGY:  No results found. ASSESSMENT AND PLAN:  Douglas Silva  is a 66 y.o. male with a known history per below presented to the emergency room via EMS for mechanical fall, occurred at his home as patient was using his walker, tripped and fell backwards resulting in back pain acutely, no loss of consciousness, ER workup noted for acute kidney injury with creatinine of 2 with baseline creatinine norma   1. Acute kidney injury felt to be due to ATN -hemodialysis again today. - plan for PermCath on hold since pt making good UOP---785 cc over 24hours  2. Acute rhabdomyolysis.  Likely from falls and statin medication. - CPK normal  3. BPH.  Continue finasteride.  With Parkinson's Flomax is likely not a good medication for this patient.  PSA normal range.  Patient is noted to have some hematuria renal ultrasound shows no pathology likely related to chronic anticoagulation will need outpatient urology follow-up  4. Hypothyroidism unspecified on levothyroxine.  TSH normal range.  5. Atrial  fibrillation history rate control with metoprolol.  Continue Coumadin and pharmacy is dosing  6. Physical therapy evaluation: SNF.  7. Parkinson's disease on Requip  8. Hyperlipidemia unspecified.  Stopped simvastatin due to severe rhabdomyolysis  Overall improving.  Patient does not  require any more hemodialysis temporary catheter will be removed and will work on discharge planning to rehab.  Case discussed with Care Management/Social Worker. Management plans discussed with the patient, family and they are in agreement.  CODE STATUS: full  DVT Prophylaxis: *heparin*  TOTAL TIME TAKING CARE OF THIS PATIENT: *30* minutes.  >50% time spent on counselling and coordination of care  POSSIBLE D/C IN 1-2 DAYS, DEPENDING ON CLINICAL CONDITION.  Note: This dictation was prepared with Dragon dictation along with smaller phrase technology. Any transcriptional errors that result from this process are unintentional.  Fritzi Mandes M.D on 09/15/2017 at 12:46 PM  Between 7am to 6pm - Pager - [phone]  After 6pm go to [email or web address] - password [email or web address] [place] [facility]  Office  [phone]  CC: Primary care physician; [name], MD

## 2017-09-16 LAB — PROTIME-INR
INR: 1.91
Prothrombin Time: 21.7 seconds — ABNORMAL HIGH (ref 11.4–15.2)

## 2017-09-16 MED ORDER — ADULT MULTIVITAMIN W/MINERALS CH: 1.0000 | ORAL_TABLET | Freq: Every day | ORAL | 0 refills | Status: DC

## 2017-09-16 MED ORDER — FINASTERIDE 5 MG PO TABS: 5.0000 mg | ORAL_TABLET | Freq: Every day | ORAL | 0 refills | Status: DC

## 2017-09-16 MED ORDER — WARFARIN SODIUM 5 MG PO TABS: 5.0000 mg | ORAL_TABLET | Freq: Once | ORAL | Status: DC

## 2017-09-16 MED ORDER — IBUPROFEN 400 MG PO TABS: 400.0000 mg | ORAL_TABLET | Freq: Three times a day (TID) | ORAL | 0 refills | Status: DC | PRN

## 2017-09-16 MED ORDER — POLYETHYLENE GLYCOL 3350 17 G PO PACK: 17.0000 g | PACK | Freq: Every day | ORAL | 0 refills | Status: DC

## 2017-09-16 MED ORDER — SIMVASTATIN 20 MG PO TABS: 20.0000 mg | ORAL_TABLET | Freq: Every day | ORAL | 0 refills | Status: AC

## 2017-09-16 NOTE — Progress Notes (Signed)
Physical Therapy Treatment Patient Details Name: Douglas Silva. MRN: [id number] DOB: 29-Oct-1950 Today's Date: 09/16/2017    History of Present Illness Pt is a 66 y.o. male presenting to hospital s/p fall with back pain (pt walking with walker and lost footing d/t chronic movement issues with Parkinson's disease).  09/05/17 temporary R femoral HD catheter placed d/t acute kidney injury.  PMH includes a-fib, Parkinson's disease, htn, CAD.    PT Comments    Pt appearing in improved mood today (compared to last session) and eager to participate in PT.  Pt did well with allowable LE and UE exercises performed in bed.  L heelcord still mildly tighter than R so stretching performed to improve ROM.   D/t R temporary femoral HD catheter, (per hospital protocol) not able to perform OOB mobility. Will continue to focus on strengthening/ex's in bed until temporary femoral HD catheter is removed and pt is cleared to get OOB.   Follow Up Recommendations  SNF     Equipment Recommendations  Rolling walker with 5" wheels    Recommendations for Other Services OT consult     Precautions / Restrictions Precautions Precautions: Fall Precaution Comments: R temporary femoral HD catheter restrictions;  Aspiration Restrictions Weight Bearing Restrictions: No    Mobility  Bed Mobility               General bed mobility comments: Deferred d/t temporary R femoral HD catheter restrictions  Transfers                 General transfer comment: Deferred d/t temporary R femoral HD catheter restrictions  Ambulation/Gait             General Gait Details: Deferred d/t temporary R femoral HD catheter restrictions   Stairs            Wheelchair Mobility    Modified Rankin (Stroke Patients Only)       Balance                                            Cognition Arousal/Alertness: Awake/alert Behavior During Therapy: Flat affect Overall Cognitive  Status: Within Functional Limits for tasks assessed                                        Exercises Total Joint Exercises Ankle Circles/Pumps: AROM;Strengthening;Both;Supine;15 reps Quad Sets: AROM;Strengthening;Both;Supine;15 reps(vc's and tactile cues required for exercise) Short Arc Quad: AROM;Strengthening;Left;Supine(15 reps x2) Heel Slides: AROM;Strengthening;Left;Supine(15 reps x2) Hip ABduction/ADduction: AAROM;AROM;Strengthening;Left;Supine(15 reps x2) Straight Leg Raises: AAROM;Strengthening;Left;10 reps;Supine(15 reps x2) General Exercises - Upper Extremity Shoulder Flexion: AROM;Strengthening;Both;Supine(15 reps x2) Elbow Flexion: AROM;Strengthening;10 reps;Supine;Both(15 reps x2 (to 90 degrees flexion)) Elbow Extension: AROM;Strengthening;10 reps;Supine;Both(15 reps x2) Other Exercises Other Exercises: B hand grip squeezes 15 reps x2 (supine in bed) Other Exercises: 2x30 seconds B heelcord stretch (supine in bed)    General Comments General comments (skin integrity, edema, etc.): R temporary femoral HD catheter in place.  Nursing cleared pt for participation in physical therapy.  Pt agreeable to PT session.      Pertinent Vitals/Pain Pain Assessment: No/denies pain Pain Intervention(s): Monitored during session    Home Living                      Prior  Function            PT Goals (current goals can now be found in the care plan section) Acute Rehab PT Goals Patient Stated Goal: to be able to walk again PT Goal Formulation: With patient Time For Goal Achievement: 09/18/17 Potential to Achieve Goals: Fair Progress towards PT goals: Progressing toward goals(with strengthening)    Frequency    Min 2X/week      PT Plan Current plan remains appropriate    Co-evaluation              AM-PAC PT "6 Clicks" Daily Activity  Outcome Measure  Difficulty turning over in bed (including adjusting bedclothes, sheets and blankets)?:  Unable Difficulty moving from lying on back to sitting on the side of the bed? : Unable Difficulty sitting down on and standing up from a chair with arms (e.g., wheelchair, bedside commode, etc,.)?: Unable Help needed moving to and from a bed to chair (including a wheelchair)?: A Lot Help needed walking in hospital room?: Total Help needed climbing 3-5 steps with a railing? : Total 6 Click Score: 7    End of Session   Activity Tolerance: Patient tolerated treatment well Patient left: in bed;with call bell/phone within reach;with bed alarm set Nurse Communication: Mobility status;Precautions PT Visit Diagnosis: Other abnormalities of gait and mobility (R26.89);Repeated falls (R29.6);Muscle weakness (generalized) (M62.81)     Time: 2549-8264 PT Time Calculation (min) (ACUTE ONLY): 25 min  Charges:  $Therapeutic Exercise: 23-37 mins                    G CodesLeitha Bleak, PT 09/16/17, 9:41 AM [phone]

## 2017-09-16 NOTE — Care Management Important Message (Signed)
Important Message  Patient Details  Name: Douglas Silva. MRN: [id number] Date of Birth: [birth year]   Medicare Important Message Given:  Yes    Shelbie Ammons, RN 09/16/2017, 8:29 AM

## 2017-09-16 NOTE — Discharge Summary (Addendum)
[facility] at [place] NAME: Douglas Silva    MR#:  [id number]  DATE OF BIRTH:  [birth year]  DATE OF ADMISSION:  09/03/2017 ADMITTING PHYSICIAN: [name], MD  DATE OF DISCHARGE: 09/16/17  PRIMARY CARE PHYSICIAN: [name], MD    ADMISSION DIAGNOSIS:  Acute renal insufficiency [N28.9] AKI (acute kidney injury) ([place]) [N17.9] Fall, initial encounter [W19.XXXA]  DISCHARGE DIAGNOSIS:  Acute renal failure due to acute Rhabdomyolysis and ATN--pt required HD (temporary)  SECONDARY DIAGNOSIS:   Past Medical History:  Diagnosis Date  . Atrial fibrillation ([place])   . Cardiomyopathy ([name])   . Coronary artery disease   . Hypertension   . Hypothyroidism   . Parkinson's disease [facility])     HOSPITAL COURSE:  FrankRobertsonis a26 y.o.malewith a known history per below presented to the emergency room via EMS for mechanical fall, occurred at his home as patient was using his walker, tripped and fell backwards resulting in back pain acutely, no loss of consciousness, ER workup noted for acute kidney injury with creatinine of 2 with baseline creatinine norma  1.Acute kidney injury felt to be due to ATN -pt requi red temporary hemodialysis  -UOP 1450 cc over 24 hours -D/c temp HD cath and d/c foley. -d/w dr lateef--close out pt f/u with Dr [name]  2.Acute rhabdomyolysis. Likely from falls and statin medication. -CPK normal -PT recommendations noted  BPH. Continue finasteride.  PSA normal range.   4. Hypothyroidism unspecified on levothyroxine. TSH normal range.  5. Atrial fibrillation history rate control with metoprolol. Continue Coumadin and pharmacy is dosing (pt did not get last evening's ddose--?reason )  6. Physical therapy evaluation: SNF.  7. Parkinson's disease on Requip  8. Hyperlipidemia unspecified. Stopped simvastatin due to severe rhabdomyolysis  Overall improving.  Patient does  not require any more hemodialysis temporary catheter will be removed and will work on discharge planning to rehab [name] updated   CONSULTS OBTAINED:  Treatment Team:  [name], MD  DRUG ALLERGIES:  No Known Allergies  DISCHARGE MEDICATIONS:   Current Discharge Medication List    START taking these medications   Details  finasteride (PROSCAR) 5 MG tablet Take 1 tablet (5 mg total) by mouth daily. Qty: 30 tablet, Refills: 0    Multiple Vitamin (MULTIVITAMIN WITH MINERALS) TABS tablet Take 1 tablet by mouth daily. Qty: 30 tablet, Refills: 0    polyethylene glycol (MIRALAX / GLYCOLAX) packet Take 17 g by mouth daily. Qty: 14 each, Refills: 0      CONTINUE these medications which have CHANGED   Details  ibuprofen (ADVIL,MOTRIN) 400 MG tablet Take 1 tablet (400 mg total) by mouth every 8 (eight) hours as needed. Qty: 20 tablet, Refills: 0    simvastatin (ZOCOR) 20 MG tablet Take 1 tablet (20 mg total) by mouth daily. Qty: 30 tablet, Refills: 0      CONTINUE these medications which have NOT CHANGED   Details  levothyroxine (SYNTHROID, LEVOTHROID) 100 MCG tablet Take 100 mcg by mouth daily before breakfast.    metoprolol succinate (TOPROL-XL) 25 MG 24 hr tablet Take 25 mg by mouth daily.    rOPINIRole (REQUIP) 0.5 MG tablet Take 1 tablet 4 (four) times daily by mouth.    warfarin (COUMADIN) 5 MG tablet Take 5 mg by mouth daily. @@1700         If you experience worsening of your admission symptoms, develop shortness of breath, life threatening emergency, suicidal or homicidal thoughts you  must seek medical attention immediately by calling 911 or calling your MD immediately  if symptoms less severe.  You Must read complete instructions/literature along with all the possible adverse reactions/side effects for all the Medicines you take and that have been prescribed to you. Take any new Medicines after you have completely understood and accept all the possible  adverse reactions/side effects.   Please note  You were cared for by a hospitalist during your hospital stay. If you have any questions about your discharge medications or the care you received while you were in the hospital after you are discharged, you can call the unit and asked to speak with the hospitalist on call if the hospitalist that took care of you is not available. Once you are discharged, your primary care physician will handle any further medical issues. Please note that NO REFILLS for any discharge medications will be authorized once you are discharged, as it is imperative that you return to your primary care physician (or establish a relationship with a primary care physician if you do not have one) for your aftercare needs so that they can reassess your need for medications and monitor your lab values. Today   SUBJECTIVE    Doing well VITAL SIGNS:  Blood pressure 128/86, pulse 84, temperature 97.8 F (36.6 C), temperature source Oral, resp. rate 20, height 5\' 11"  (1.803 m), weight 90.2 kg (198 lb 13.7 oz), SpO2 99 %.  I/O:    Intake/Output Summary (Last 24 hours) at 09/16/2017 1000 Last data filed at 09/16/2017 0856 Gross per 24 hour  Intake 0 ml  Output 1990 ml  Net -1990 ml    PHYSICAL EXAMINATION:  GENERAL:  66 y.o.-year-old patient lying in the bed with no acute distress.  EYES: Pupils equal, round, reactive to light and accommodation. No scleral icterus. Extraocular muscles intact.  HEENT: Head atraumatic, normocephalic. Oropharynx and nasopharynx clear.  NECK:  Supple, no jugular venous distention. No thyroid enlargement, no tenderness.  LUNGS: Normal breath sounds bilaterally, no wheezing, rales,rhonchi or crepitation. No use of accessory muscles of respiration.  CARDIOVASCULAR: S1, S2 normal. No murmurs, rubs, or gallops.  ABDOMEN: Soft, non-tender, non-distended. Bowel sounds present. No organomegaly or mass. EXTREMITIES: No pedal edema, cyanosis, or  clubbing.  NEUROLOGIC: Cranial nerves II through XII are intact. Muscle strength 4/5 in all extremities. Sensation intact. Gait not checked. Rest tremors+ PSYCHIATRIC: The patient is alert and oriented x 3.  SKIN: No obvious rash, lesion, or ulcer.   DATA REVIEW:   CBC  Recent Labs  Lab 09/12/17 0355  WBC 10.3  HGB 11.2*  HCT 33.3*  PLT 164    Chemistries  Recent Labs  Lab 09/15/17 1018  NA 135  K 4.5  CL 98*  CO2 28  GLUCOSE 128*  BUN 35*  CREATININE 5.34*  CALCIUM 8.2*    Microbiology Results   No results found for this or any previous visit (from the past 240 hour(s)).  RADIOLOGY:  No results found.   Management plans discussed with the patient, family and they are in agreement.  CODE STATUS:     Code Status Orders  (From admission, onward)        Start     Ordered   09/04/17 0042  Full code  Continuous     09/04/17 0041    Code Status History    Date Active Date Inactive Code Status Order ID Comments User Context   06/01/2017 20:14 06/03/2017 15:06 Full Code [id number]  Dawson,  Richard, MD ED      TOTAL TIME TAKING CARE OF THIS PATIENT: *30* minutes.    Fritzi Mandes M.D on 09/16/2017 at 10:00 AM  Between 7am to 6pm - Pager - [phone] After 6pm go to [email or web address] - password [email or web address] [facility]  Office  [phone]  CC: Primary care physician; [name], MD

## 2017-09-16 NOTE — Progress Notes (Signed)
ANTICOAGULATION CONSULT NOTE - Follow Up Consult  Pharmacy Consult for Warfarin Indication: atrial fibrillation  No Known Allergies  Patient Measurements: Height: 5\' 11"  (180.3 cm) Weight: 198 lb 13.7 oz (90.2 kg) IBW/kg (Calculated) : 75.3 Heparin Dosing Weight: na  Vital Signs: Temp: 97.8 F (36.6 C) (11/23 0451) Temp Source: Oral (11/23 0451) BP: 128/86 (11/23 0451) Pulse Rate: 84 (11/23 0451)  Labs: Recent Labs    09/14/17 0519 09/14/17 1129 09/15/17 0536 09/15/17 1018 09/16/17 0350  LABPROT 26.1*  --  23.8*  --  21.7*  INR 2.42  --  2.15  --  1.91  CREATININE 5.95* 6.20*  --  5.34*  --     Estimated Creatinine Clearance: 14.7 mL/min (A) (by C-G formula based on SCr of 5.34 mg/dL (H)).  Assessment: Patient admitted for fall, is chronically anticoagulated w/ warfarin for afib. CT head with no acute intracranial hemorrhage. Pharmacist spoke with pt about warfarin who states he takes one whole tab daily of pink/peach color tablet (could not state mg strength so asked color of tablet); home dose listed as 5 mg daily on PTA med list.   Date    INR      Dose 11/11   3.05     Held 11/12   2.68     5 mg 11/13   2.29     5 mg 11/14   2.02     6 mg 11/15   2.03     6 mg 11/16   2.44     4 mg 11/17   3.12     3 mg 11/18   2.93     4 mg 11/19   3.01     3 mg 11/20   2.72     Dose held for possible permcath placement 11/21   2.42    4 mg  11/22  2.15      Dose not charted as given? 11/23  1.91  Goal of Therapy:  INR 2-3 Monitor platelets by anticoagulation protocol: Yes   Plan:  Warfarin not given last night per eMAR?Marland Kitchen  Will order Warfarin 5 mg x 1 tonight Will need to continue with daily INR checks until INR stablizes. Follow up INR in AM.  Chinita Greenland PharmD Clinical Pharmacist 09/16/2017

## 2017-09-16 NOTE — Clinical Social Work Placement (Signed)
   CLINICAL SOCIAL WORK PLACEMENT  NOTE  Date:  09/16/2017  Patient Details  Name: Douglas Silva. MRN: [id number] Date of Birth: [birth year]  Clinical Social Work is seeking post-discharge placement for this patient at the [place] level of care (*CSW will initial, date and re-position this form in  chart as items are completed):  Yes   Patient/family provided with [facility] Work Department's list of facilities offering this level of care within the geographic area requested by the patient (or if unable, by the patient's family).  Yes   Patient/family informed of their freedom to choose among providers that offer the needed level of care, that participate in Medicare, Medicaid or managed care program needed by the patient, have an available bed and are willing to accept the patient.  Yes   Patient/family informed of [facility]'s ownership interest in [facility] and [facility], as well as of the fact that they are under no obligation to receive care at these facilities.  PASRR submitted to EDS on       PASRR number received on       Existing PASRR number confirmed on 09/04/17     FL2 transmitted to all facilities in geographic area requested by pt/family on 09/04/17     FL2 transmitted to all facilities within larger geographic area on       Patient informed that his/her managed care company has contracts with or will negotiate with certain facilities, including the following:        Yes   Patient/family informed of bed offers received.  Patient chooses bed at [facility] )     Physician recommends and patient chooses bed at [facility])    Patient to be transferred to [facility]) on 09/16/17.  Patient to be transferred to facility by (EMS)     Patient family notified on 09/16/17 of transfer.  Name of family member notified:  (sister)     PHYSICIAN       Additional Comment:     _______________________________________________ Shela Leff, LCSW 09/16/2017, 10:47 AM

## 2017-09-16 NOTE — Progress Notes (Signed)
[facility], [place] 09/16/17  Subjective:  Urine output did improve yesterday to 1.4 L. However creatinine still remains high at 5.34. Temporary dialysis catheter removed.    Objective:  Vital signs in last 24 hours:  Temp:  [97.8 F (36.6 C)-98.4 F (36.9 C)] 97.8 F (36.6 C) (11/23 0451) Pulse Rate:  [84-102] 102 (11/23 1014) Resp:  [18-20] 20 (11/23 0451) BP: (126-135)/(51-93) 128/87 (11/23 1014) SpO2:  [97 %-99 %] 99 % (11/23 0451)  Weight change:  Filed Weights   09/14/17 0500 09/14/17 1045 09/14/17 1414  Weight: 89.4 kg (197 lb 3.2 oz) 90.7 kg (199 lb 15.3 oz) 90.2 kg (198 lb 13.7 oz)    Intake/Output:    Intake/Output Summary (Last 24 hours) at 09/16/2017 1155 Last data filed at 09/16/2017 0900 Gross per 24 hour  Intake 240 ml  Output 1990 ml  Net -1750 ml     Physical Exam: General:  No acute distress, laying in the bed  HEENT  anicteric, moist oral mucous membranes  Neck  supple  Pulm/lungs  scattered rhonchi, normal effort  CVS/Heart  S1S2 no rubs  Abdomen:   Soft, nontender  Extremities:  Trace edema  Neurologic:  Alert, oriented, Parkinson's tremors  Skin:  No acute rashes  Access:  none       Basic Metabolic Panel:  Recent Labs  Lab 09/11/17 0420 09/13/17 0515 09/13/17 0920 09/14/17 0519 09/14/17 1129 09/15/17 1018  NA 136 134*  --  137 134* 135  K 4.2 4.5  --  4.4 4.6 4.5  CL 99* 98*  --  99* 96* 98*  CO2 28 28  --  29 28 28   GLUCOSE 96 95  --  99 114* 128*  BUN 29* 42*  --  37* 44* 35*  CREATININE 4.92* 6.03*  --  5.95* 6.20* 5.34*  CALCIUM 8.1* 8.1*  --  8.1* 8.0* 8.2*  PHOS 3.7 4.8* 5.1* 4.9* 4.8*  --      CBC: Recent Labs  Lab 09/12/17 0355  WBC 10.3  HGB 11.2*  HCT 33.3*  MCV 91.3  PLT 164      Lab Results  Component Value Date   HEPBSAG Negative 09/06/2017      Microbiology:  No results found for this or any previous visit (from the past 240 hour(s)).  Coagulation  Studies: Recent Labs    09/14/17 0519 09/15/17 0536 09/16/17 0350  LABPROT 26.1* 23.8* 21.7*  INR 2.42 2.15 1.91    Urinalysis: No results for input(s): COLORURINE, LABSPEC, PHURINE, GLUCOSEU, HGBUR, BILIRUBINUR, KETONESUR, PROTEINUR, UROBILINOGEN, NITRITE, LEUKOCYTESUR in the last 72 hours.  Invalid input(s): APPERANCEUR    Imaging: No results found.   Medications:    . finasteride  5 mg Oral Daily  . levothyroxine  100 mcg Oral QAC breakfast  . metoprolol succinate  25 mg Oral Daily  . multivitamin with minerals  1 tablet Oral Daily  . pneumococcal 23 valent vaccine  0.5 mL Intramuscular Tomorrow-1000  . polyethylene glycol  17 g Oral Daily  . rOPINIRole  0.5 mg Oral TID WC & HS  . sodium chloride flush  3 mL Intravenous Q12H  . warfarin  5 mg Oral ONCE-1800  . Warfarin - Pharmacist Dosing Inpatient   Does not apply q1800   acetaminophen **OR** acetaminophen, alum & mag hydroxide-simeth, Camphor-Phenol, HYDROcodone-acetaminophen, ondansetron **OR** ondansetron (ZOFRAN) IV  Assessment/ Plan:  66 y.o. Caucasian male With Parkinson's, hypertension, hypothyroidism, hyperlipidemia, atrial fibrillation requiring anticoagulation with warfarin.  He was admitted on 11/10 status post fall and was found to have  acute renal failure and rhabdomyolysis  1.  Acute renal failure.  Baseline creatinine 0.87 2.  Rhabdomyolysis 3.  Hematuria  Acute renal failure is likely secondary to ATN and rhabdomyolysis.  -Urine output has improved over the course of the hospitalization.  Urine output was 1.5 L over the preceding 24 hours.  Creatinine slightly down to 5.34.  We will need to monitor his renal function very closely as an outpatient.  No urgent indication to restart dialysis at the moment.  We plan to see the patient back in the office sometime next week.  We will be in contact with the patient in the nursing home facility that he is at for follow-up.    LOS: [place],  Jovonne Wilton 11/23/201811:55 AM  [place], [place]

## 2017-09-16 NOTE — Progress Notes (Signed)
Gave report to Early Osmond, LPN at [facility] skilled nursing facility. Patient will be transported over by EMS. Discharge teaching given to patient and paper work sent to facility. Patient verbalized understanding of discharge teaching and voiced no questions or concerns.

## 2017-09-16 NOTE — Clinical Social Work Note (Signed)
MD states patient will not need outpatient dialysis. Patient is to discharge today and will go to [facility]. MD has spoken to patient and his sister and they are aware of discharge today and going to [facility]. Discharge information has been sent. Nurse to call report then EMS. Shela Leff MSW,LCSW [phone]

## 2018-06-01 ENCOUNTER — Other Ambulatory Visit: Payer: Self-pay

## 2018-06-01 ENCOUNTER — Inpatient Hospital Stay
Admission: EM | Admit: 2018-06-01 | Discharge: 2018-06-02 | DRG: 871 | Disposition: A | Discharge status: Home / Self Care | Payer: Medicare Other | Attending: Internal Medicine | Admitting: Internal Medicine

## 2018-06-01 ENCOUNTER — Emergency Department: Payer: Medicare Other

## 2018-06-01 DIAGNOSIS — Z7901 Long term (current) use of anticoagulants: Secondary | ICD-10-CM | POA: Diagnosis not present

## 2018-06-01 DIAGNOSIS — Z95828 Presence of other vascular implants and grafts: Secondary | ICD-10-CM

## 2018-06-01 DIAGNOSIS — R402360 Coma scale, best motor response, obeys commands, unspecified time: Secondary | ICD-10-CM | POA: Diagnosis present

## 2018-06-01 DIAGNOSIS — J189 Pneumonia, unspecified organism: Secondary | ICD-10-CM

## 2018-06-01 DIAGNOSIS — I10 Essential (primary) hypertension: Secondary | ICD-10-CM | POA: Diagnosis present

## 2018-06-01 DIAGNOSIS — I429 Cardiomyopathy, unspecified: Secondary | ICD-10-CM | POA: Diagnosis present

## 2018-06-01 DIAGNOSIS — N179 Acute kidney failure, unspecified: Secondary | ICD-10-CM | POA: Diagnosis present

## 2018-06-01 DIAGNOSIS — R402250 Coma scale, best verbal response, oriented, unspecified time: Secondary | ICD-10-CM | POA: Diagnosis present

## 2018-06-01 DIAGNOSIS — R402140 Coma scale, eyes open, spontaneous, unspecified time: Secondary | ICD-10-CM | POA: Diagnosis present

## 2018-06-01 DIAGNOSIS — I482 Chronic atrial fibrillation: Secondary | ICD-10-CM | POA: Diagnosis present

## 2018-06-01 DIAGNOSIS — Z79899 Other long term (current) drug therapy: Secondary | ICD-10-CM | POA: Diagnosis not present

## 2018-06-01 DIAGNOSIS — E039 Hypothyroidism, unspecified: Secondary | ICD-10-CM | POA: Diagnosis present

## 2018-06-01 DIAGNOSIS — R791 Abnormal coagulation profile: Secondary | ICD-10-CM | POA: Diagnosis present

## 2018-06-01 DIAGNOSIS — G20 Parkinson's disease: Secondary | ICD-10-CM | POA: Diagnosis present

## 2018-06-01 DIAGNOSIS — E860 Dehydration: Secondary | ICD-10-CM | POA: Diagnosis present

## 2018-06-01 DIAGNOSIS — A419 Sepsis, unspecified organism: Principal | ICD-10-CM

## 2018-06-01 DIAGNOSIS — I2510 Atherosclerotic heart disease of native coronary artery without angina pectoris: Secondary | ICD-10-CM | POA: Diagnosis present

## 2018-06-01 DIAGNOSIS — R531 Weakness: Secondary | ICD-10-CM | POA: Diagnosis present

## 2018-06-01 LAB — URINALYSIS, COMPLETE (UACMP) WITH MICROSCOPIC
BACTERIA UA: NONE SEEN
Bilirubin Urine: NEGATIVE
Glucose, UA: NEGATIVE mg/dL
HGB URINE DIPSTICK: NEGATIVE
KETONES UR: 5 mg/dL — AB
Leukocytes, UA: NEGATIVE
Nitrite: NEGATIVE
PROTEIN: NEGATIVE mg/dL
Specific Gravity, Urine: 1.026 (ref 1.005–1.030)
pH: 5 (ref 5.0–8.0)

## 2018-06-01 LAB — BASIC METABOLIC PANEL
Anion gap: 8 (ref 5–15)
BUN: 32 mg/dL — AB (ref 8–23)
CALCIUM: 8.8 mg/dL — AB (ref 8.9–10.3)
CO2: 22 mmol/L (ref 22–32)
CREATININE: 1.42 mg/dL — AB (ref 0.61–1.24)
Chloride: 111 mmol/L (ref 98–111)
GFR calc Af Amer: 58 mL/min — ABNORMAL LOW (ref >60)
GFR, EST NON AFRICAN AMERICAN: 50 mL/min — AB (ref >60)
Glucose, Bld: 160 mg/dL — ABNORMAL HIGH (ref 70–99)
POTASSIUM: 4 mmol/L (ref 3.5–5.1)
SODIUM: 141 mmol/L (ref 135–145)

## 2018-06-01 LAB — STREP PNEUMONIAE URINARY ANTIGEN: Strep Pneumo Urinary Antigen: NEGATIVE

## 2018-06-01 LAB — PROTIME-INR
INR: 3.51
PROTHROMBIN TIME: 34.9 s — AB (ref 11.4–15.2)

## 2018-06-01 LAB — CBC
HCT: 42.3 % (ref 40.0–52.0)
Hemoglobin: 14 g/dL (ref 13.0–18.0)
MCH: 31.5 pg (ref 26.0–34.0)
MCHC: 33.1 g/dL (ref 32.0–36.0)
MCV: 95.1 fL (ref 80.0–100.0)
PLATELETS: 222 10*3/uL (ref 150–440)
RBC: 4.45 MIL/uL (ref 4.40–5.90)
RDW: 14.1 % (ref 11.5–14.5)
WBC: 17.7 10*3/uL — AB (ref 3.8–10.6)

## 2018-06-01 LAB — LACTIC ACID, PLASMA
LACTIC ACID, VENOUS: 1.4 mmol/L (ref 0.5–1.9)
Lactic Acid, Venous: 3.1 mmol/L (ref 0.5–1.9)
Lactic Acid, Venous: 1.4 mmol/L (ref 0.5–1.9)

## 2018-06-01 LAB — TROPONIN I: Troponin I: <0.03 ng/mL (ref <0.03)

## 2018-06-01 MED ORDER — SODIUM CHLORIDE 0.9 % IV BOLUS
1000.0000 mL | Freq: Once | INTRAVENOUS | Status: AC
  Administered 2018-06-01: 1000 mL via INTRAVENOUS

## 2018-06-01 MED ORDER — AZITHROMYCIN 500 MG PO TABS: 500.0000 mg | ORAL_TABLET | ORAL | Status: DC

## 2018-06-01 MED ORDER — WARFARIN SODIUM 6 MG PO TABS: 6.0000 mg | ORAL_TABLET | Freq: Every day | ORAL | Status: DC

## 2018-06-01 MED ORDER — LEVOTHYROXINE SODIUM 100 MCG PO TABS
100.0000 ug | ORAL_TABLET | Freq: Every day | ORAL | Status: DC
  Administered 2018-06-02: 100 ug via ORAL
  Filled 2018-06-01: qty 1

## 2018-06-01 MED ORDER — CARBIDOPA-LEVODOPA 25-100 MG PO TABS
1.0000 | ORAL_TABLET | Freq: Four times a day (QID) | ORAL | Status: DC
  Administered 2018-06-01 – 2018-06-02 (×4): 1 via ORAL
  Filled 2018-06-01 (×7): qty 1

## 2018-06-01 MED ORDER — METOPROLOL SUCCINATE ER 25 MG PO TB24: 25.0000 mg | ORAL_TABLET | Freq: Every day | ORAL | Status: DC

## 2018-06-01 MED ORDER — AZITHROMYCIN 500 MG PO TABS
500.0000 mg | ORAL_TABLET | Freq: Once | ORAL | Status: AC
  Administered 2018-06-01: 500 mg via ORAL
  Filled 2018-06-01: qty 1

## 2018-06-01 MED ORDER — SIMVASTATIN 20 MG PO TABS
20.0000 mg | ORAL_TABLET | Freq: Every day | ORAL | Status: DC
  Administered 2018-06-01: 20 mg via ORAL
  Filled 2018-06-01: qty 1

## 2018-06-01 MED ORDER — ADULT MULTIVITAMIN W/MINERALS CH
1.0000 | ORAL_TABLET | Freq: Every day | ORAL | Status: DC
  Administered 2018-06-02: 1 via ORAL
  Filled 2018-06-01: qty 1

## 2018-06-01 MED ORDER — POLYETHYLENE GLYCOL 3350 17 G PO PACK: 17.0000 g | PACK | Freq: Every day | ORAL | Status: DC | PRN

## 2018-06-01 MED ORDER — FINASTERIDE 5 MG PO TABS
5.0000 mg | ORAL_TABLET | Freq: Every day | ORAL | Status: DC
  Administered 2018-06-02: 5 mg via ORAL
  Filled 2018-06-01: qty 1

## 2018-06-01 MED ORDER — SODIUM CHLORIDE 0.9 % IV SOLN
INTRAVENOUS | Status: DC
  Administered 2018-06-01 – 2018-06-02 (×2): via INTRAVENOUS

## 2018-06-01 MED ORDER — SODIUM CHLORIDE 0.9 % IV SOLN
1.0000 g | Freq: Once | INTRAVENOUS | Status: AC
  Administered 2018-06-01: 1 g via INTRAVENOUS
  Filled 2018-06-01: qty 10

## 2018-06-01 MED ORDER — METOPROLOL SUCCINATE ER 25 MG PO TB24
25.0000 mg | ORAL_TABLET | Freq: Every day | ORAL | Status: DC
  Administered 2018-06-01 – 2018-06-02 (×2): 25 mg via ORAL
  Filled 2018-06-01 (×2): qty 1

## 2018-06-01 MED ORDER — SODIUM CHLORIDE 0.9 % IV SOLN
1.0000 g | INTRAVENOUS | Status: DC
  Filled 2018-06-01: qty 10

## 2018-06-01 NOTE — ED Triage Notes (Signed)
Pt arrives via [facility] from home after caregiver could not get into his home, Pt states he was "too weak to get up to the door using his walker like usual." EMS reports pt reported dizziness upon standing up. Protocol initiated.

## 2018-06-01 NOTE — Progress Notes (Signed)
CODE SEPSIS - PHARMACY COMMUNICATION  **Broad Spectrum Antibiotics should be administered within 1 hour of Sepsis diagnosis**  Time Code Sepsis Called/Page Received: @1108   Antibiotics Ordered: Ceftriaxone   Time of 1st antibiotic administration: @1130   Additional action taken by pharmacy: NA  If necessary, Name of Provider/Nurse Contacted: NA   Pernell Dupre, PharmD, BCPS Clinical Pharmacist 06/01/2018 11:41 AM

## 2018-06-01 NOTE — H&P (Signed)
[place] at [place] NAME: Douglas Silva    MR#:  [id number]  DATE OF BIRTH:  07-18-1951  DATE OF ADMISSION:  06/01/2018  PRIMARY CARE PHYSICIAN: Remi Haggard, FNP   REQUESTING/REFERRING PHYSICIAN:   CHIEF COMPLAINT:   Chief Complaint  Patient presents with  . Weakness    HISTORY OF PRESENT ILLNESS: Douglas Silva  is a 67 y.o. male with a known history of per below presented to the emergency room with generalized weakness, fatigue, decreased energy level, dizziness, lightheadedness, in the emergency room patient was found to have tachycardia, tachypnea, white count of 17,000 with pneumonia chest x-ray, sepsis protocol initiated, patient evaluated in the emergency room, sister present, patient now be admitted for acute sepsis due to community-acquired pneumonia with acute kidney injury.  PAST MEDICAL HISTORY:   Past Medical History:  Diagnosis Date  . Atrial fibrillation ([place])   . Cardiomyopathy ([place])   . Coronary artery disease   . Hypertension   . Hypothyroidism   . Parkinson's disease ([place])     PAST SURGICAL HISTORY:  Past Surgical History:  Procedure Laterality Date  . COLONOSCOPY N/A 04/10/2015   Procedure: COLONOSCOPY;  Surgeon: Manya Silvas, MD;  Location: [facility] ENDOSCOPY;  Service: Endoscopy;  Laterality: N/A;  . CORONARY ANGIOPLASTY    . DIALYSIS/PERMA CATHETER INSERTION N/A 09/05/2017   Procedure: DIALYSIS/PERMA CATHETER INSERTION;  Surgeon: Algernon Huxley, MD;  Location: [facility];  Service: Cardiovascular;  Laterality: N/A;    SOCIAL HISTORY:  Social History   Tobacco Use  . Smoking status: Never Smoker  . Smokeless tobacco: Never Used  Substance Use Topics  . Alcohol use: No    FAMILY HISTORY:  Family History  Problem Relation Age of Onset  . Lung cancer Mother   . Lung cancer Father     DRUG ALLERGIES: No Known Allergies  REVIEW OF SYSTEMS:   CONSTITUTIONAL: No fever, fatigue or  weakness.  EYES: No blurred or double vision.  EARS, NOSE, AND THROAT: No tinnitus or ear pain.  RESPIRATORY: No cough, shortness of breath, wheezing or hemoptysis.  CARDIOVASCULAR: No chest pain, orthopnea, edema.  GASTROINTESTINAL: No nausea, vomiting, diarrhea or abdominal pain.  GENITOURINARY: No dysuria, hematuria.  ENDOCRINE: No polyuria, nocturia,  HEMATOLOGY: No anemia, easy bruising or bleeding SKIN: No rash or lesion. MUSCULOSKELETAL: No joint pain or arthritis.   NEUROLOGIC: No tingling, numbness, weakness.  PSYCHIATRY: No anxiety or depression.   MEDICATIONS AT HOME:  Prior to Admission medications   Medication Sig Start Date End Date Taking? Authorizing Provider  carbidopa-levodopa (SINEMET IR) 25-100 MG tablet Take 1 tablet by mouth 4 (four) times daily. 05/11/18  Yes [provider]  finasteride (PROSCAR) 5 MG tablet Take 1 tablet (5 mg total) by mouth daily. 09/16/17  Yes Fritzi Mandes, MD  levothyroxine (SYNTHROID, LEVOTHROID) 100 MCG tablet Take 100 mcg by mouth daily before breakfast.   Yes [provider]  metoprolol succinate (TOPROL-XL) 25 MG 24 hr tablet Take 25 mg by mouth daily. 05/20/17  Yes [provider]  Multiple Vitamin (MULTIVITAMIN WITH MINERALS) TABS tablet Take 1 tablet by mouth daily. 09/16/17  Yes Fritzi Mandes, MD  simvastatin (ZOCOR) 20 MG tablet Take 1 tablet (20 mg total) by mouth daily. 09/23/17  Yes Fritzi Mandes, MD  warfarin (COUMADIN) 6 MG tablet Take 6 mg by mouth daily. @@1700     Yes [provider]  ibuprofen (ADVIL,MOTRIN) 400 MG tablet Take 1 tablet (  400 mg total) by mouth every 8 (eight) hours as needed. 09/16/17   Fritzi Mandes, MD  polyethylene glycol [facility] / Floria Raveling) packet Take 17 g by mouth daily. 09/16/17   Fritzi Mandes, MD      PHYSICAL EXAMINATION:   VITAL SIGNS: Blood pressure 107/72, pulse 97, temperature 99.2 F (37.3 C), resp. rate (!) 24, height 5\' 11"  (1.803 m), weight 78 kg, SpO2 97  %.  GENERAL:  67 y.o.-year-old patient lying in the bed with no acute distress.  EYES: Pupils equal, round, reactive to light and accommodation. No scleral icterus. Extraocular muscles intact.  HEENT: Head atraumatic, normocephalic. Oropharynx and nasopharynx clear.  NECK:  Supple, no jugular venous distention. No thyroid enlargement, no tenderness.  LUNGS: Normal breath sounds bilaterally, no wheezing, rales,rhonchi or crepitation. No use of accessory muscles of respiration.  CARDIOVASCULAR: S1, S2 normal. No murmurs, rubs, or gallops.  ABDOMEN: Soft, nontender, nondistended. Bowel sounds present. No organomegaly or mass.  EXTREMITIES: No pedal edema, cyanosis, or clubbing.  NEUROLOGIC: Cranial nerves II through XII are intact. Muscle strength 5/5 in all extremities. Sensation intact. Gait not checked.  PSYCHIATRIC: The patient is alert and oriented x 3.  SKIN: No obvious rash, lesion, or ulcer.   LABORATORY PANEL:   CBC Recent Labs  Lab 06/01/18 0841  WBC 17.7*  HGB 14.0  HCT 42.3  PLT 222  MCV 95.1  MCH 31.5  MCHC 33.1  RDW 14.1   ------------------------------------------------------------------------------------------------------------------  Chemistries  Recent Labs  Lab 06/01/18 0841  NA 141  K 4.0  CL 111  CO2 22  GLUCOSE 160*  BUN 32*  CREATININE 1.42*  CALCIUM 8.8*   ------------------------------------------------------------------------------------------------------------------ estimated creatinine clearance is 55.2 mL/min (A) (by C-G formula based on SCr of 1.42 mg/dL (H)). ------------------------------------------------------------------------------------------------------------------ No results for input(s): TSH, T4TOTAL, T3FREE, THYROIDAB in the last 72 hours.  Invalid input(s): FREET3   Coagulation profile Recent Labs  Lab 06/01/18 1002  INR 3.51    ------------------------------------------------------------------------------------------------------------------- No results for input(s): DDIMER in the last 72 hours. -------------------------------------------------------------------------------------------------------------------  Cardiac Enzymes Recent Labs  Lab 06/01/18 0841  TROPONINI <0.03   ------------------------------------------------------------------------------------------------------------------ Invalid input(s): POCBNP  ---------------------------------------------------------------------------------------------------------------  Urinalysis    Component Value Date/Time   COLORURINE YELLOW (A) 06/01/2018 1005   APPEARANCEUR HAZY (A) 06/01/2018 1005   LABSPEC 1.026 06/01/2018 1005   PHURINE 5.0 06/01/2018 1005   GLUCOSEU NEGATIVE 06/01/2018 1005   HGBUR NEGATIVE 06/01/2018 1005   BILIRUBINUR NEGATIVE 06/01/2018 1005   KETONESUR 5 (A) 06/01/2018 1005   PROTEINUR NEGATIVE 06/01/2018 1005   NITRITE NEGATIVE 06/01/2018 1005   LEUKOCYTESUR NEGATIVE 06/01/2018 1005     RADIOLOGY: Dg Chest 2 View  Result Date: 06/01/2018 CLINICAL DATA:  New cough.  Dizziness with standing EXAM: CHEST - 2 VIEW COMPARISON:  09/03/2017 FINDINGS: Chronic cardiomegaly. Generalized fine interstitial opacity not seen previously. Mild scarring or atelectasis in the left lung. No effusion or pneumothorax. Stable aortic and hilar contours. Convexity of the lower right mediastinum likely present on prior and favoring hiatal hernia. IMPRESSION: Generalized interstitial opacity that is new from prior, favored bronchitic rather than congestive. Electronically Signed   By: Monte Fantasia M.D.   On: 06/01/2018 09:52    EKG: Orders placed or performed during the hospital encounter of 06/01/18  . EKG 12-Lead  . EKG 12-Lead  . ED EKG within 10 minutes  . ED EKG within 10 minutes    IMPRESSION AND PLAN: *Acute sepsis secondary to  community-acquired pneumonia *Community-acquired pneumonia, acute *Acute kidney  injury *Chronic A. Fib *Parkinson's disease  Admit to regular nursing for bed on our sepsis and pneumonia protocol, IV fluids for rehydration, empiric Rocephin/azithromycin, follow-up on cultures, supplemental oxygen wean as tolerated, and continue close medical monitoring      All the records are reviewed and case discussed with ED provider. Management plans discussed with the patient, family and they are in agreement.  CODE STATUS:full Code Status History    Date Active Date Inactive Code Status Order ID Comments User Context   09/04/2017 0041 09/16/2017 1524 Full Code [id number]  [name], MD Inpatient   06/01/2017 2014 06/03/2017 1506 Full Code [id number]  Loletha Grayer, MD ED       TOTAL TIME TAKING CARE OF THIS PATIENT: 45 minutes.    Avel Peace Adonai Selsor M.D on 06/01/2018   Between 7am to 6pm - Pager - [phone]  After 6pm go to [email or web address] - password [email or web address] [facility]  Office  [phone]  CC: Primary care physician; Remi Haggard, FNP   Note: This dictation was prepared with Dragon dictation along with smaller phrase technology. Any transcriptional errors that result from this process are unintentional.

## 2018-06-01 NOTE — Progress Notes (Signed)
Family Meeting Note  Advance Directive:yes  Today a meeting took place with the Patient.  Patient is able to participate   The following clinical team members were present during this meeting:MD  The following were discussed:Patient's diagnosis: Sepsis, Parkinson's disease, Patient's progosis: Unable to determine and Goals for treatment: Full Code  Additional follow-up to be provided: prn  Time spent during discussion:20 minutes  [name], MD

## 2018-06-01 NOTE — ED Notes (Signed)
Report to [place], [place]

## 2018-06-01 NOTE — ED Notes (Signed)
Patient transported to room 142

## 2018-06-01 NOTE — ED Provider Notes (Signed)
[facility] Emergency Department Provider Note  ____________________________________________  Time seen: Approximately 10:08 AM  I have reviewed the triage vital signs and the nursing notes.   HISTORY  Chief Complaint Weakness   HPI Elishua Radford. is a 67 y.o. male with a history of Parkinson's disease, atrial fibrillation, CAD, hypertension, hypothyroidism who presents for evaluation of generalized weakness.  Patient reports that he has been feeling progressively weaker since yesterday.  This morning he was unable to get up from bed.  Home health nurse came to check on him and could not get in the house because patient could not open the door.  She called 911.  Patient was found in bed complaining of severe weakness.  He reports urinary frequency and had 2 episodes of incontinence last night due to inability to get up from the bed to go to the bathroom.  He has had chills and nausea.  No fever, no abdominal pain, no flank pain, no chest pain, no cough, no shortness of breath.  Patient had a low-grade fever per EMS of 56F  Past Medical History:  Diagnosis Date  . Atrial fibrillation ([place])   . Cardiomyopathy ([place])   . Coronary artery disease   . Hypertension   . Hypothyroidism   . Parkinson's disease [facility])     Patient Active Problem List   Diagnosis Date Noted  . AKI (acute kidney injury) ([place]) 09/03/2017  . Cellulitis 06/01/2017    Past Surgical History:  Procedure Laterality Date  . COLONOSCOPY N/A 04/10/2015   Procedure: COLONOSCOPY;  Surgeon: Manya Silvas, MD;  Location: [facility] ENDOSCOPY;  Service: Endoscopy;  Laterality: N/A;  . CORONARY ANGIOPLASTY    . DIALYSIS/PERMA CATHETER INSERTION N/A 09/05/2017   Procedure: DIALYSIS/PERMA CATHETER INSERTION;  Surgeon: Algernon Huxley, MD;  Location: [place] CV LAB;  Service: Cardiovascular;  Laterality: N/A;    Prior to Admission medications   Medication Sig Start Date End Date Taking?  Authorizing Provider  carbidopa-levodopa (SINEMET IR) 25-100 MG tablet Take 1 tablet by mouth 4 (four) times daily. 05/11/18  Yes [provider]  finasteride (PROSCAR) 5 MG tablet Take 1 tablet (5 mg total) by mouth daily. 09/16/17  Yes Fritzi Mandes, MD  levothyroxine (SYNTHROID, LEVOTHROID) 100 MCG tablet Take 100 mcg by mouth daily before breakfast.   Yes [provider]  metoprolol succinate (TOPROL-XL) 25 MG 24 hr tablet Take 25 mg by mouth daily. 05/20/17  Yes [provider]  Multiple Vitamin (MULTIVITAMIN WITH MINERALS) TABS tablet Take 1 tablet by mouth daily. 09/16/17  Yes Fritzi Mandes, MD  simvastatin (ZOCOR) 20 MG tablet Take 1 tablet (20 mg total) by mouth daily. 09/23/17  Yes Fritzi Mandes, MD  warfarin (COUMADIN) 6 MG tablet Take 6 mg by mouth daily. @@1700     Yes [provider]  ibuprofen (ADVIL,MOTRIN) 400 MG tablet Take 1 tablet (400 mg total) by mouth every 8 (eight) hours as needed. 09/16/17   Fritzi Mandes, MD  polyethylene glycol [facility]ing) packet Take 17 g by mouth daily. 09/16/17   Fritzi Mandes, MD    Allergies Patient has no known allergies.  Family History  Problem Relation Age of Onset  . Lung cancer Mother   . Lung cancer Father     Social History Social History   Tobacco Use  . Smoking status: Never Smoker  . Smokeless tobacco: Never Used  Substance Use Topics  . Alcohol use: No  . Drug use: No  Review of Systems  Constitutional: Negative for fever. + chills and generalized weakness Eyes: Negative for visual changes. ENT: Negative for sore throat. Neck: No neck pain  Cardiovascular: Negative for chest pain. Respiratory: Negative for shortness of breath. Gastrointestinal: Negative for abdominal pain, vomiting or diarrhea. Genitourinary: Negative for dysuria. + frequency Musculoskeletal: Negative for back pain. Skin: Negative for rash. Neurological: Negative for headaches, weakness or numbness. Psych: No  SI or HI  ____________________________________________   PHYSICAL EXAM:  VITAL SIGNS: ED Triage Vitals  Enc Vitals Group     BP 06/01/18 0835 110/82     Pulse Rate 06/01/18 0900 94     Resp 06/01/18 0835 (!) 21     Temp 06/01/18 0837 99.2 F (37.3 C)     Temp src --      SpO2 06/01/18 0900 97 %     Weight 06/01/18 0838 172 lb (78 kg)     Height 06/01/18 0838 5\' 11"  (1.803 m)     Head Circumference --      Peak Flow --      Pain Score 06/01/18 0838 0     Pain Loc --      Pain Edu? --      Excl. in [place]? --     Constitutional: Alert and oriented. Well appearing and in no apparent distress. HEENT:      Head: Normocephalic and atraumatic.         Eyes: Conjunctivae are normal. Sclera is non-icteric.       Mouth/Throat: Mucous membranes are moist.       Neck: Supple with no signs of meningismus. Cardiovascular: Regular rate and rhythm. No murmurs, gallops, or rubs. 2+ symmetrical distal pulses are present in all extremities. No JVD. Respiratory: Normal respiratory effort. Lungs are clear to auscultation bilaterally. No wheezes, crackles, or rhonchi.  Gastrointestinal: Soft, non tender, and non distended with positive bowel sounds. No rebound or guarding. Musculoskeletal: Nontender with normal range of motion in all extremities. No edema, cyanosis, or erythema of extremities. Neurologic: Normal speech and language. Face is symmetric. Moving all extremities. No gross focal neurologic deficits are appreciated. Skin: Skin is warm, dry and intact. No rash noted. Psychiatric: Mood and affect are normal. Speech and behavior are normal.  ____________________________________________   LABS (all labs ordered are listed, but only abnormal results are displayed)  Labs Reviewed  BASIC METABOLIC PANEL - Abnormal; Notable for the following components:      Result Value   Glucose, Bld 160 (*)    BUN 32 (*)    Creatinine, Ser 1.42 (*)    Calcium 8.8 (*)    GFR calc non Af Amer 50 (*)     GFR calc Af Amer 58 (*)    All other components within normal limits  CBC - Abnormal; Notable for the following components:   WBC 17.7 (*)    All other components within normal limits  PROTIME-INR - Abnormal; Notable for the following components:   Prothrombin Time 34.9 (*)    All other components within normal limits  LACTIC ACID, PLASMA - Abnormal; Notable for the following components:   Lactic Acid, Venous 3.1 (*)    All other components within normal limits  URINALYSIS, COMPLETE (UACMP) WITH MICROSCOPIC - Abnormal; Notable for the following components:   Color, Urine YELLOW (*)    APPearance HAZY (*)    Ketones, ur 5 (*)    All other components within normal limits  CULTURE, BLOOD (ROUTINE X 2)  CULTURE, BLOOD (ROUTINE X 2)  TROPONIN I  LACTIC ACID, PLASMA  LACTIC ACID, PLASMA   ____________________________________________  EKG  ED ECG REPORT I, Rudene Re, the attending physician, personally viewed and interpreted this ECG.  Atrial fibrillation, rate of 103, normal QRS and QTC, normal axis, no ST elevations or depressions.  No prior for comparison. ____________________________________________  RADIOLOGY  I have personally reviewed the images performed during this visit and I agree with the Radiologist's read.   Interpretation by Radiologist:  Dg Chest 2 View  Result Date: 06/01/2018 CLINICAL DATA:  New cough.  Dizziness with standing EXAM: CHEST - 2 VIEW COMPARISON:  09/03/2017 FINDINGS: Chronic cardiomegaly. Generalized fine interstitial opacity not seen previously. Mild scarring or atelectasis in the left lung. No effusion or pneumothorax. Stable aortic and hilar contours. Convexity of the lower right mediastinum likely present on prior and favoring hiatal hernia. IMPRESSION: Generalized interstitial opacity that is new from prior, favored bronchitic rather than congestive. Electronically Signed   By: Monte Fantasia M.D.   On: 06/01/2018 09:52       ____________________________________________   PROCEDURES  Procedure(s) performed: None Procedures Critical Care performed: yes  CRITICAL CARE Performed by: Rudene Re  ?  Total critical care time: 35 min  Critical care time was exclusive of separately billable procedures and treating other patients.  Critical care was necessary to treat or prevent imminent or life-threatening deterioration.  Critical care was time spent personally by me on the following activities: development of treatment plan with patient and/or surrogate as well as nursing, discussions with consultants, evaluation of patient's response to treatment, examination of patient, obtaining history from patient or surrogate, ordering and performing treatments and interventions, ordering and review of laboratory studies, ordering and review of radiographic studies, pulse oximetry and re-evaluation of patient's condition.  ____________________________________________   INITIAL IMPRESSION / ASSESSMENT AND PLAN / ED COURSE   67 y.o. male with a history of Parkinson's disease, atrial fibrillation, CAD, hypertension, hypothyroidism who presents for evaluation of generalized weakness.  Patient with a low-grade temp of 99.2, tachycardic with pulse in the low 100s, currently in atrial fibrillation.  Lungs are clear to auscultation, abdomen is soft with no tenderness.  Labs show a leukocytosis with white count of 17.7.  Patient complaining of urinary symptoms.  Most likely UTI versus early urosepsis.  Patient has not taken his metoprolol at this morning which could be contributing to his tachycardia therefore we will give his dose right now.  We will give IV fluids.  UA is pending.    _________________________ 11:48 AM on 06/01/2018 -----------------------------------------  Work-up consistent with sepsis with tachycardia, low-grade fever, elevated lactic, white count of 17.7.  UA is actually clean.  Chest x-ray showing  generalized interstitial opacity most likely from community-acquired pneumonia.  Patient was given Rocephin and azithromycin.  Will admit to the hospitalist service for   As part of my medical decision making, I reviewed the following data within the [place] notes reviewed and incorporated, Labs reviewed , EKG interpreted , Old chart reviewed, Radiograph reviewed , Discussed with admitting physician , Notes from prior ED visits and [place] Controlled Substance Database    Pertinent labs & imaging results that were available during my care of the patient were reviewed by me and considered in my medical decision making (see chart for details).    ____________________________________________   FINAL CLINICAL IMPRESSION(S) / ED DIAGNOSES  Final diagnoses:  Sepsis, due to unspecified organism [facility])  Community  acquired pneumonia, unspecified laterality      NEW MEDICATIONS STARTED DURING THIS VISIT:  ED Discharge Orders    None       Note:  This document was prepared using Dragon voice recognition software and may include unintentional dictation errors.    Alfred Levins, [place], MD 06/01/18 1149

## 2018-06-01 NOTE — Progress Notes (Signed)
ANTICOAGULATION CONSULT NOTE - Initial Consult  Pharmacy Consult for Warfarin  Indication: atrial fibrillation  No Known Allergies  Patient Measurements: Height: 5\' 11"  (180.3 cm) Weight: 172 lb (78 kg) IBW/kg (Calculated) : 75.3 Heparin Dosing Weight:   Vital Signs: Temp: 99.2 F (37.3 C) (08/08 0837) BP: 108/70 (08/08 1230) Pulse Rate: 100 (08/08 1230)  Labs: Recent Labs    06/01/18 0841 06/01/18 1002  HGB 14.0  --   HCT 42.3  --   PLT 222  --   LABPROT  --  34.9*  INR  --  3.51  CREATININE 1.42*  --   TROPONINI <0.03  --     Estimated Creatinine Clearance: 55.2 mL/min (A) (by C-G formula based on SCr of 1.42 mg/dL (H)).   Medical History: Past Medical History:  Diagnosis Date  . Atrial fibrillation ([place])   . Cardiomyopathy ([place])   . Coronary artery disease   . Hypertension   . Hypothyroidism   . Parkinson's disease ([place])     Medications:  Medications Prior to Admission  Medication Sig Dispense Refill Last Dose  . carbidopa-levodopa (SINEMET IR) 25-100 MG tablet Take 1 tablet by mouth 4 (four) times daily.   06/01/2018 at Unknown time  . finasteride (PROSCAR) 5 MG tablet Take 1 tablet (5 mg total) by mouth daily. 30 tablet 0 06/01/2018 at Unknown time  . levothyroxine (SYNTHROID, LEVOTHROID) 100 MCG tablet Take 100 mcg by mouth daily before breakfast.   06/01/2018 at Unknown time  . metoprolol succinate (TOPROL-XL) 25 MG 24 hr tablet Take 25 mg by mouth daily.   06/01/2018 at Unknown time  . Multiple Vitamin (MULTIVITAMIN WITH MINERALS) TABS tablet Take 1 tablet by mouth daily. 30 tablet 0 06/01/2018 at Unknown time  . simvastatin (ZOCOR) 20 MG tablet Take 1 tablet (20 mg total) by mouth daily. 30 tablet 0 05/31/2018 at Unknown time  . warfarin (COUMADIN) 6 MG tablet Take 6 mg by mouth daily. @@1700     05/31/2018 at Unknown time  . ibuprofen (ADVIL,MOTRIN) 400 MG tablet Take 1 tablet (400 mg total) by mouth every 8 (eight) hours as needed. 20 tablet 0 PRN at PRN  .  polyethylene glycol (MIRALAX / GLYCOLAX) packet Take 17 g by mouth daily. 14 each 0 PRN at PRN    Assessment: Pharmacy consulted to dose warfarin in this 67 year old male with Afib.   Pt was on warfarin 6 mg PO daily at home. 8/8 INR =  3.51   Goal of Therapy:  INR 2-3   Plan:  Will hold warfarin on 8/8 and recheck INR on 8/9 with AM labs.   Chyler Creely D 06/01/2018,2:26 PM

## 2018-06-02 DIAGNOSIS — A419 Sepsis, unspecified organism: Secondary | ICD-10-CM | POA: Diagnosis not present

## 2018-06-02 LAB — BASIC METABOLIC PANEL
Anion gap: 4 — ABNORMAL LOW (ref 5–15)
BUN: 28 mg/dL — AB (ref 8–23)
CHLORIDE: 111 mmol/L (ref 98–111)
CO2: 28 mmol/L (ref 22–32)
CREATININE: 1.13 mg/dL (ref 0.61–1.24)
Calcium: 8.7 mg/dL — ABNORMAL LOW (ref 8.9–10.3)
GFR calc Af Amer: >60 mL/min (ref >60)
GFR calc non Af Amer: >60 mL/min (ref >60)
GLUCOSE: 98 mg/dL (ref 70–99)
POTASSIUM: 4.1 mmol/L (ref 3.5–5.1)
SODIUM: 143 mmol/L (ref 135–145)

## 2018-06-02 LAB — CBC
HEMATOCRIT: 39.4 % — AB (ref 40.0–52.0)
HEMOGLOBIN: 13.3 g/dL (ref 13.0–18.0)
MCH: 32.2 pg (ref 26.0–34.0)
MCHC: 33.9 g/dL (ref 32.0–36.0)
MCV: 95.3 fL (ref 80.0–100.0)
Platelets: 197 10*3/uL (ref 150–440)
RBC: 4.14 MIL/uL — AB (ref 4.40–5.90)
RDW: 14 % (ref 11.5–14.5)
WBC: 10 10*3/uL (ref 3.8–10.6)

## 2018-06-02 LAB — LEGIONELLA PNEUMOPHILA SEROGP 1 UR AG: L. pneumophila Serogp 1 Ur Ag: NEGATIVE

## 2018-06-02 LAB — PROTIME-INR
INR: 3.95
Prothrombin Time: 38.3 seconds — ABNORMAL HIGH (ref 11.4–15.2)

## 2018-06-02 MED ORDER — WARFARIN SODIUM 6 MG PO TABS: 6.0000 mg | ORAL_TABLET | Freq: Every day | ORAL | Status: DC

## 2018-06-02 MED ORDER — CEFUROXIME AXETIL 500 MG PO TABS: 500.0000 mg | ORAL_TABLET | Freq: Two times a day (BID) | ORAL | 0 refills | Status: DC

## 2018-06-02 MED ORDER — AZITHROMYCIN 250 MG PO TABS: 250.0000 mg | ORAL_TABLET | ORAL | Status: DC

## 2018-06-02 MED ORDER — AZITHROMYCIN 250 MG PO TABS: ORAL_TABLET | ORAL | 0 refills | Status: AC

## 2018-06-02 NOTE — Progress Notes (Signed)
Patient ready for discharge home with caregiver.  Reviewed all discharge instructions including f/u appointments and prescriptions. Pt will discharge with family

## 2018-06-02 NOTE — Plan of Care (Signed)

## 2018-06-02 NOTE — Discharge Summary (Signed)
[place] [facility] [place] NAME: Douglas Silva    MR#:  [id number]  DATE OF BIRTH:  Oct 19, 1951  DATE OF ADMISSION:  06/01/2018 ADMITTING PHYSICIAN: [name], MD  DATE OF DISCHARGE: 06/02/2018  PRIMARY CARE PHYSICIAN: Remi Haggard, FNP    ADMISSION DIAGNOSIS:  Sepsis, due to unspecified organism ([place]) [A41.9] Community acquired pneumonia, unspecified laterality [J18.9]  DISCHARGE DIAGNOSIS:  Active Problems:   Sepsis ([place])   SECONDARY DIAGNOSIS:   Past Medical History:  Diagnosis Date  . Atrial fibrillation ([place])   . Cardiomyopathy ([place])   . Coronary artery disease   . Hypertension   . Hypothyroidism   . Parkinson's disease [facility])     HOSPITAL COURSE:   67 year old male with a history of Parkinson's disease and chronic atrial fibrillation who presented to the ER generalized weakness.  1.  Sepsis: Patient presented with tachycardia, tachypnea and leukocytosis along with elevated lactic acid level.  Sepsis is due from community-acquired pneumonia.  Sepsis has resolved.  2.  Community acquired pneumonia: Patient was on ceftriaxone and azithromycin and will be transitioned to oral Ceftin and azithromycin.  3.  Acute kidney injury in the setting of generalized weakness and dehydration from pneumonia which has improved with IV fluids.  4.  Parkinson's disease: Patient will continue outpatient regimen.  5.  Chronic atrial fibrillation: INR is elevated and therefore patient is instructed to hold Coumadin Friday Saturday and Sunday.  He will have INR checked on Sunday and will have his PCP on Monday restart Coumadin if indicated.  6.  Essential hypertension: Patient will resume outpatient medications  7.  Hypothyroidism: Continue Synthroid   DISCHARGE CONDITIONS AND DIET:   Stable for discharge on heart healthy diet  CONSULTS OBTAINED:    DRUG ALLERGIES:  No Known Allergies  DISCHARGE MEDICATIONS:   Allergies as of  06/02/2018   No Known Allergies     Medication List    TAKE these medications   azithromycin 250 MG tablet Commonly known as:  ZITHROMAX Take 1 tablet daily for 4 days   carbidopa-levodopa 25-100 MG tablet Commonly known as:  SINEMET IR Take 1 tablet by mouth 4 (four) times daily.   cefUROXime 500 MG tablet Commonly known as:  CEFTIN Take 1 tablet (500 mg total) by mouth 2 (two) times daily with a meal.   finasteride 5 MG tablet Commonly known as:  PROSCAR Take 1 tablet (5 mg total) by mouth daily.   ibuprofen 400 MG tablet Commonly known as:  ADVIL,MOTRIN Take 1 tablet (400 mg total) by mouth every 8 (eight) hours as needed.   levothyroxine 100 MCG tablet Commonly known as:  SYNTHROID, LEVOTHROID Take 100 mcg by mouth daily before breakfast.   metoprolol succinate 25 MG 24 hr tablet Commonly known as:  TOPROL-XL Take 25 mg by mouth daily.   multivitamin with minerals Tabs tablet Take 1 tablet by mouth daily.   polyethylene glycol packet Commonly known as:  MIRALAX / GLYCOLAX Take 17 g by mouth daily.   simvastatin 20 MG tablet Commonly known as:  ZOCOR Take 1 tablet (20 mg total) by mouth daily.   warfarin 6 MG tablet Commonly known as:  COUMADIN Take 1 tablet (6 mg total) by mouth daily. @@1700    Please do not take this on Friday, Saturday and Sunday 8/9-8/11. See PCP on Monday for INR check to restart What changed:  additional instructions         Today  CHIEF COMPLAINT:  Patient is doing well this morning.  No fever chills.   VITAL SIGNS:  Blood pressure (!) 101/54, pulse 89, temperature 97.6 F (36.4 C), temperature source Oral, resp. rate 18, height 5\' 11"  (1.803 m), weight 78 kg, SpO2 100 %.   REVIEW OF SYSTEMS:  Review of Systems  Constitutional: Negative.  Negative for chills, fever and malaise/fatigue.  HENT: Negative.  Negative for ear discharge, ear pain, hearing loss, nosebleeds and sore throat.   Eyes: Negative.  Negative for  blurred vision and pain.  Respiratory: Negative.  Negative for cough, hemoptysis, shortness of breath and wheezing.   Cardiovascular: Negative.  Negative for chest pain, palpitations and leg swelling.  Gastrointestinal: Negative.  Negative for abdominal pain, blood in stool, diarrhea, nausea and vomiting.  Genitourinary: Negative.  Negative for dysuria.  Musculoskeletal: Negative.  Negative for back pain.  Skin: Negative.   Neurological: Negative for dizziness, tremors, speech change, focal weakness, seizures and headaches.       Parkinson's disease  Endo/Heme/Allergies: Negative.  Does not bruise/bleed easily.  Psychiatric/Behavioral: Negative.  Negative for depression, hallucinations and suicidal ideas.     PHYSICAL EXAMINATION:  GENERAL:  67 y.o.-year-old patient lying in the bed with no acute distress.  NECK:  Supple, no jugular venous distention. No thyroid enlargement, no tenderness.  LUNGS: Normal breath sounds bilaterally, no wheezing, rales,rhonchi  No use of accessory muscles of respiration.  CARDIOVASCULAR: Regular,. No murmurs, rubs, or gallops.  ABDOMEN: Soft, non-tender, non-distended. Bowel sounds present. No organomegaly or mass.  EXTREMITIES: No pedal edema, cyanosis, or clubbing.  PSYCHIATRIC: The patient is alert and oriented x 3.  SKIN: No obvious rash, lesion, or ulcer.   DATA REVIEW:   CBC Recent Labs  Lab 06/02/18 0533  WBC 10.0  HGB 13.3  HCT 39.4*  PLT 197    Chemistries  Recent Labs  Lab 06/02/18 0534  NA 143  K 4.1  CL 111  CO2 28  GLUCOSE 98  BUN 28*  CREATININE 1.13  CALCIUM 8.7*    Cardiac Enzymes Recent Labs  Lab 06/01/18 0841  TROPONINI <0.03    Microbiology Results  @[id number] @  RADIOLOGY:  Dg Chest 2 View  Result Date: 06/01/2018 CLINICAL DATA:  New cough.  Dizziness with standing EXAM: CHEST - 2 VIEW COMPARISON:  09/03/2017 FINDINGS: Chronic cardiomegaly. Generalized fine interstitial opacity not seen previously. Mild  scarring or atelectasis in the left lung. No effusion or pneumothorax. Stable aortic and hilar contours. Convexity of the lower right mediastinum likely present on prior and favoring hiatal hernia. IMPRESSION: Generalized interstitial opacity that is new from prior, favored bronchitic rather than congestive. Electronically Signed   By: Monte Fantasia M.D.   On: 06/01/2018 09:52      Allergies as of 06/02/2018   No Known Allergies     Medication List    TAKE these medications   azithromycin 250 MG tablet Commonly known as:  ZITHROMAX Take 1 tablet daily for 4 days   carbidopa-levodopa 25-100 MG tablet Commonly known as:  SINEMET IR Take 1 tablet by mouth 4 (four) times daily.   cefUROXime 500 MG tablet Commonly known as:  CEFTIN Take 1 tablet (500 mg total) by mouth 2 (two) times daily with a meal.   finasteride 5 MG tablet Commonly known as:  PROSCAR Take 1 tablet (5 mg total) by mouth daily.   ibuprofen 400 MG tablet Commonly known as:  ADVIL,MOTRIN Take 1 tablet (400 mg total) by mouth every 8 (  eight) hours as needed.   levothyroxine 100 MCG tablet Commonly known as:  SYNTHROID, LEVOTHROID Take 100 mcg by mouth daily before breakfast.   metoprolol succinate 25 MG 24 hr tablet Commonly known as:  TOPROL-XL Take 25 mg by mouth daily.   multivitamin with minerals Tabs tablet Take 1 tablet by mouth daily.   polyethylene glycol packet Commonly known as:  MIRALAX / GLYCOLAX Take 17 g by mouth daily.   simvastatin 20 MG tablet Commonly known as:  ZOCOR Take 1 tablet (20 mg total) by mouth daily.   warfarin 6 MG tablet Commonly known as:  COUMADIN Take 1 tablet (6 mg total) by mouth daily. @@1700    Please do not take this on Friday, Saturday and Sunday 8/9-8/11. See PCP on Monday for INR check to restart What changed:  additional instructions          Management plans discussed with the patient and he is in agreement. Stable for discharge   Patient should  follow up with pcp for inr check  CODE STATUS:     Code Status Orders  (From admission, onward)         Start     Ordered   06/01/18 1413  Full code  Continuous     08 /08/19 1412        Code Status History    Date Active Date Inactive Code Status Order ID Comments User Context   09/04/2017 0041 09/16/2017 1524 Full Code [id number]  [name], MD Inpatient   06/01/2017 2014 06/03/2017 1506 Full Code [id number]  Loletha Grayer, MD ED    Advance Directive Documentation     Most Recent Value  Type of Advance Directive  Healthcare Power of Attorney  Pre-existing out of facility DNR order (yellow form or pink MOST form)  -  "MOST" Form in Place?  -      TOTAL TIME TAKING CARE OF THIS PATIENT: 38 minutes.    Note: This dictation was prepared with Dragon dictation along with smaller phrase technology. Any transcriptional errors that result from this process are unintentional.  Atavia Poppe M.D on 06/02/2018 at 9:02 AM  Between 7am to 6pm - Pager - [phone] After 6pm go to [email or web address] - password [email or web address] [place] [facility]  Office  [phone]  CC: Primary care physician; Remi Haggard, FNP

## 2018-06-02 NOTE — Progress Notes (Signed)
ANTICOAGULATION CONSULT NOTE - Initial Consult  Pharmacy Consult for Warfarin  Indication: atrial fibrillation  No Known Allergies  Patient Measurements: Height: 5\' 11"  (180.3 cm) Weight: 172 lb (78 kg) IBW/kg (Calculated) : 75.3 Heparin Dosing Weight:   Vital Signs: Temp: 98 F (36.7 C) (08/08 2309) Temp Source: Oral (08/08 2309) BP: 111/72 (08/08 2309) Pulse Rate: 76 (08/08 2309)  Labs: Recent Labs    06/01/18 0841 06/01/18 1002 06/02/18 0533  HGB 14.0  --  13.3  HCT 42.3  --  39.4*  PLT 222  --  197  LABPROT  --  34.9* 38.3*  INR  --  3.51 3.95  CREATININE 1.42*  --   --   TROPONINI <0.03  --   --     Estimated Creatinine Clearance: 55.2 mL/min (A) (by C-G formula based on SCr of 1.42 mg/dL (H)).   Medical History: Past Medical History:  Diagnosis Date  . Atrial fibrillation ([place])   . Cardiomyopathy ([place])   . Coronary artery disease   . Hypertension   . Hypothyroidism   . Parkinson's disease ([place])     Medications:  Medications Prior to Admission  Medication Sig Dispense Refill Last Dose  . carbidopa-levodopa (SINEMET IR) 25-100 MG tablet Take 1 tablet by mouth 4 (four) times daily.   06/01/2018 at Unknown time  . finasteride (PROSCAR) 5 MG tablet Take 1 tablet (5 mg total) by mouth daily. 30 tablet 0 06/01/2018 at Unknown time  . levothyroxine (SYNTHROID, LEVOTHROID) 100 MCG tablet Take 100 mcg by mouth daily before breakfast.   06/01/2018 at Unknown time  . metoprolol succinate (TOPROL-XL) 25 MG 24 hr tablet Take 25 mg by mouth daily.   06/01/2018 at Unknown time  . Multiple Vitamin (MULTIVITAMIN WITH MINERALS) TABS tablet Take 1 tablet by mouth daily. 30 tablet 0 06/01/2018 at Unknown time  . simvastatin (ZOCOR) 20 MG tablet Take 1 tablet (20 mg total) by mouth daily. 30 tablet 0 05/31/2018 at Unknown time  . warfarin (COUMADIN) 6 MG tablet Take 6 mg by mouth daily. @@1700     05/31/2018 at Unknown time  . ibuprofen (ADVIL,MOTRIN) 400 MG tablet Take 1 tablet (400 mg  total) by mouth every 8 (eight) hours as needed. 20 tablet 0 PRN at PRN  . polyethylene glycol (MIRALAX / GLYCOLAX) packet Take 17 g by mouth daily. 14 each 0 PRN at PRN    Assessment: Pharmacy consulted to dose warfarin in this 67 year old male with Afib.   Pt was on warfarin 6 mg PO daily at home.  DATE INR DOSE 8/8  3.51 None at [facility] 8/9 3.95 Hold  Goal of Therapy:  INR 2-3   Plan:  Will hold warfarin on 8/9 and recheck INR on 8/10 with AM labs.   Laural Benes, Pharm.D., BCPS Clinical Pharmacist 06/02/2018,7:11 AM

## 2018-06-03 LAB — HIV ANTIBODY (ROUTINE TESTING W REFLEX): HIV Screen 4th Generation wRfx: NONREACTIVE

## 2018-06-05 ENCOUNTER — Ambulatory Visit: Admit: 2018-06-05 | Payer: Medicare Other | Admitting: Unknown Physician Specialty

## 2018-06-05 SURGERY — COLONOSCOPY WITH PROPOFOL
Anesthesia: General

## 2018-06-06 LAB — CULTURE, BLOOD (ROUTINE X 2)
CULTURE: NO GROWTH
Culture: NO GROWTH
SPECIAL REQUESTS: ADEQUATE
Special Requests: ADEQUATE

## 2018-08-03 IMAGING — CT CT HEAD W/O CM
4 of 6 series · 14 of 47 positions shown, 15 images · non-contrast
Comparison: None.

CLINICAL DATA: Advanced Parkinson's disease. Fall. Low back pain
and neck pain.

EXAM:
CT HEAD WITHOUT CONTRAST
CT CERVICAL SPINE WITHOUT CONTRAST
TECHNIQUE: Multidetector CT imaging of the head and cervical spine was
performed following the standard protocol without intravenous
contrast. Multiplanar CT image reconstructions of the cervical spine
were also generated.

[Series 3: head wo · axial · 0.47mm/px · z∈[+302,+392]mm · 4 of 30 slices shown, 5 images]
[im 6/30  brain]
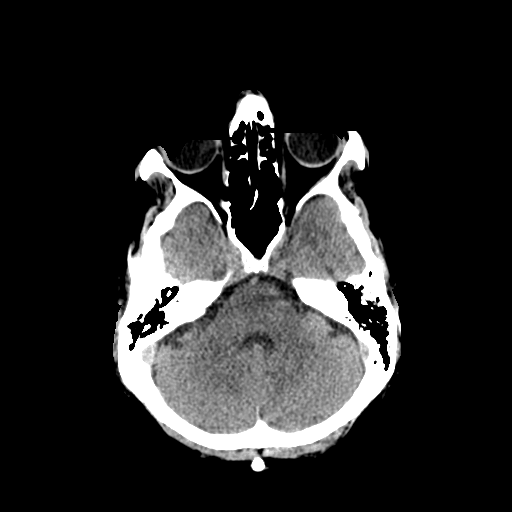
[im 6/30  bone]
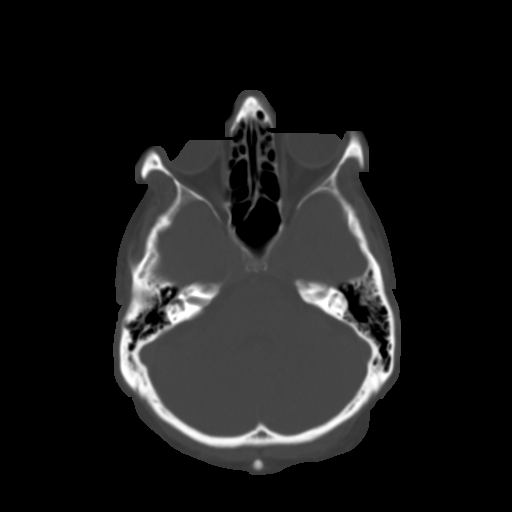
[im 12/30  brain]
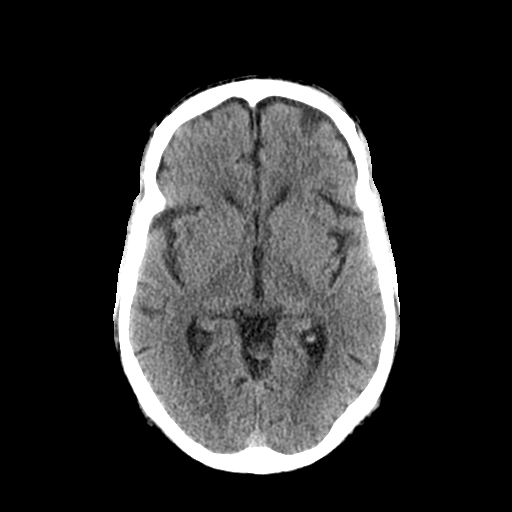
[im 18/30  brain]
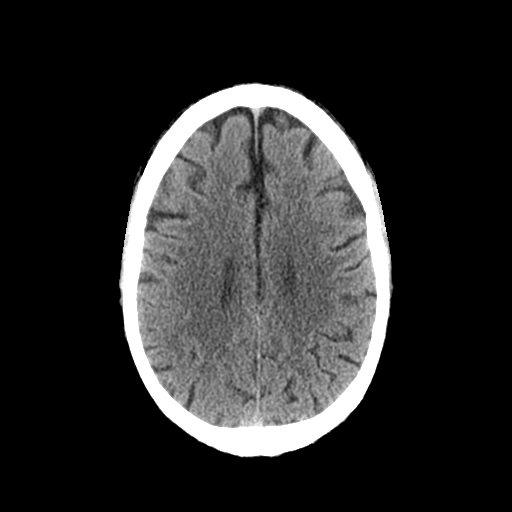
[im 24/30  brain]
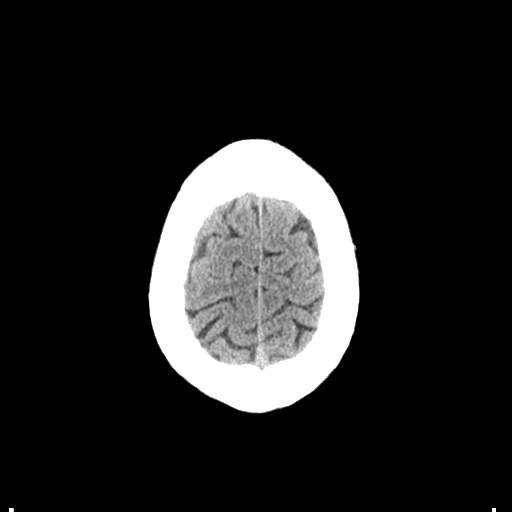

[Series 8: coronal soft tissue · coronal · 0.28mm/px · 3 of 70 slices shown]
[im 24/70  brain]
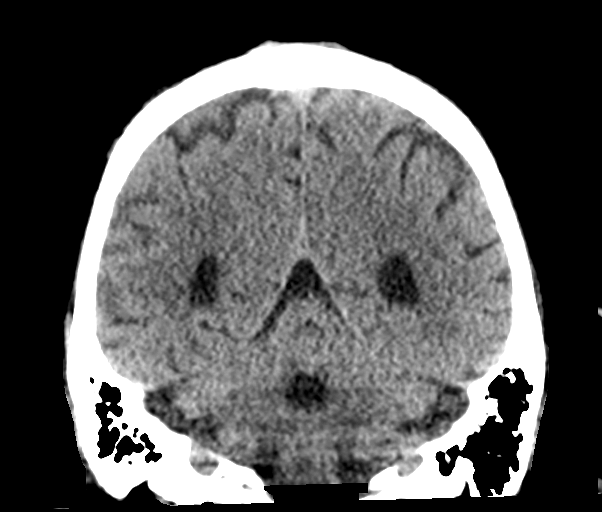
[im 31/70  brain]
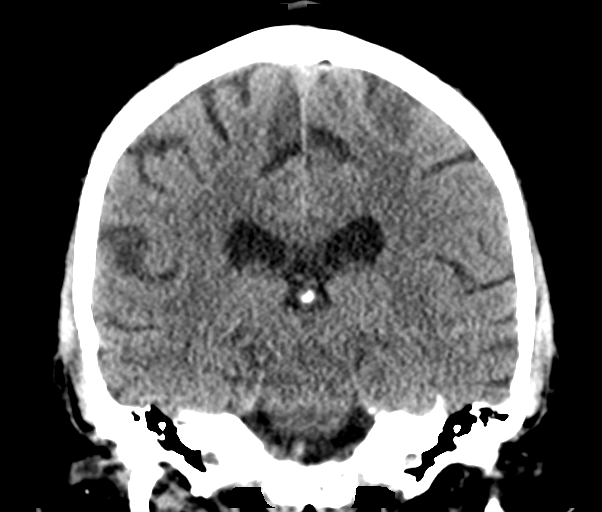
[im 39/70  brain]
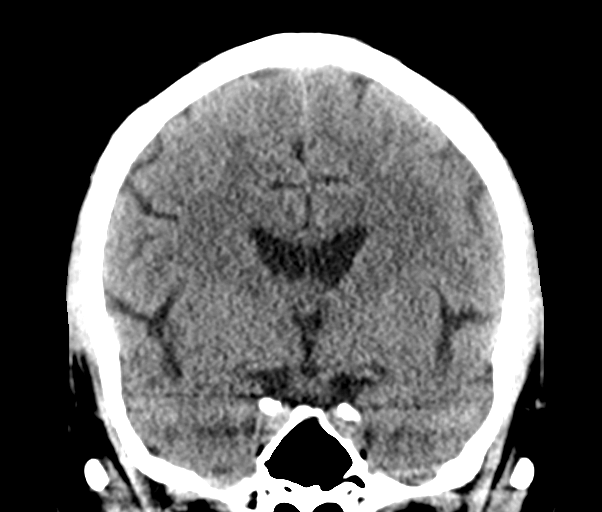

[Series 9: sagittal soft tissue · sagittal · 0.28mm/px · 1 of 56 slices shown]
[im 28/56  brain]
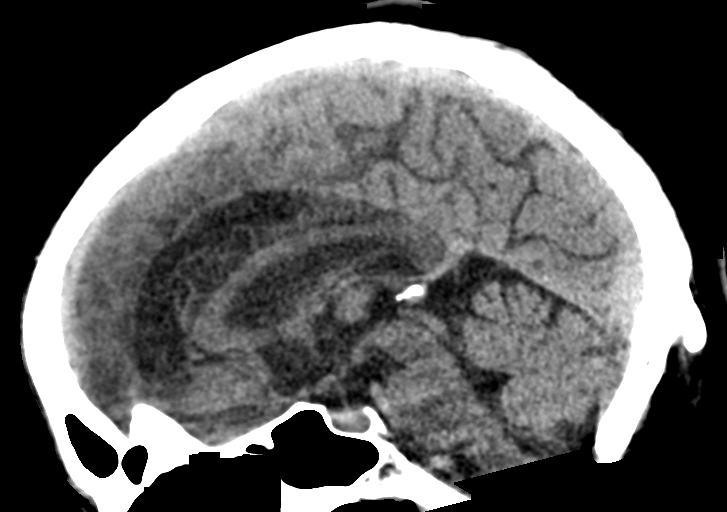

[Series 11: coronal bone · axial · 0.27mm/px · z∈[+252,+307]mm · 6 of 63 slices shown]
[im 6/63  bone]
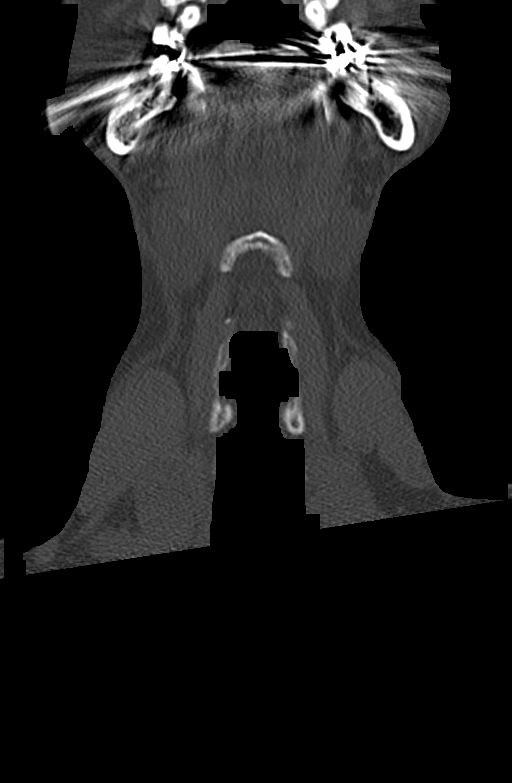
[im 12/63  bone]
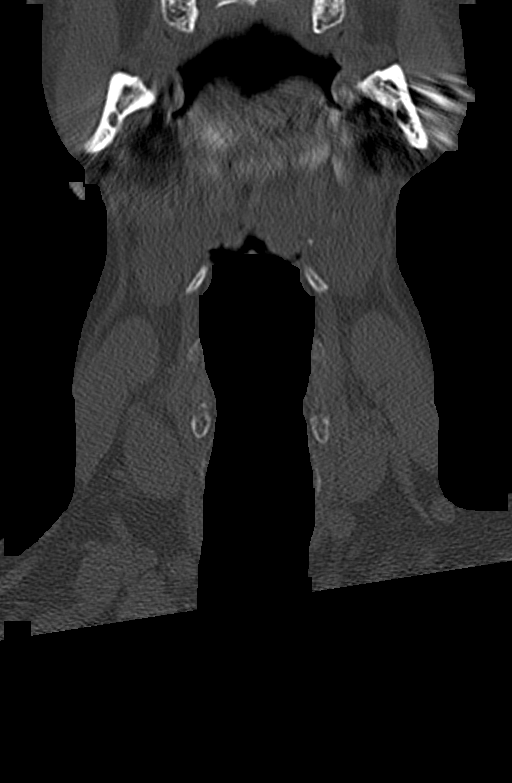
[im 23/63  bone]
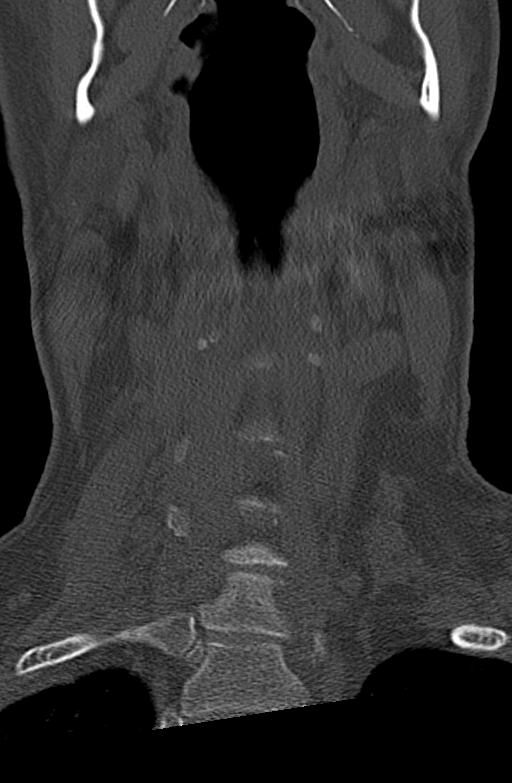
[im 29/63  bone]
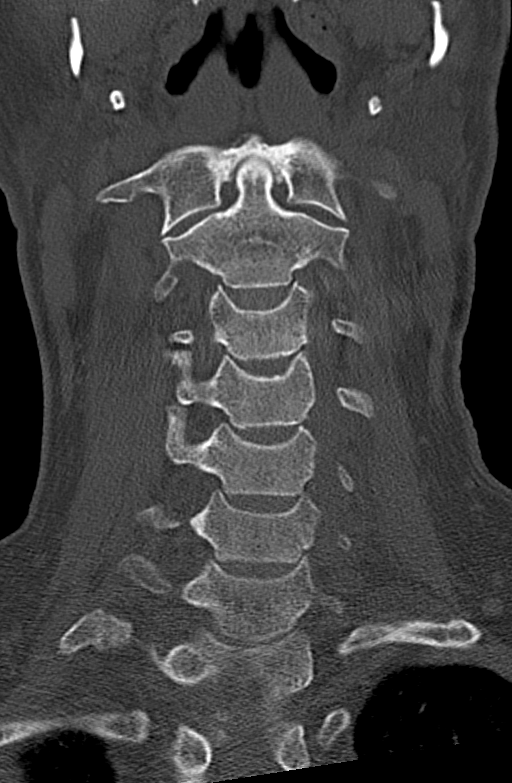
[im 34/63  bone]
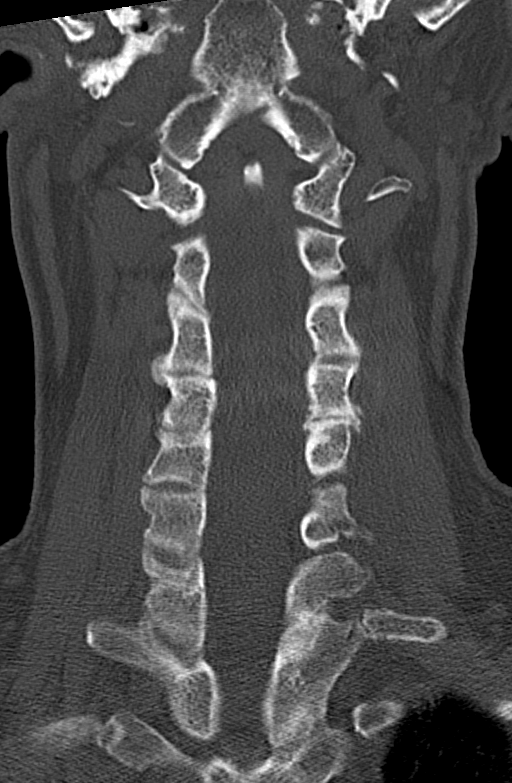
[im 40/63  bone]
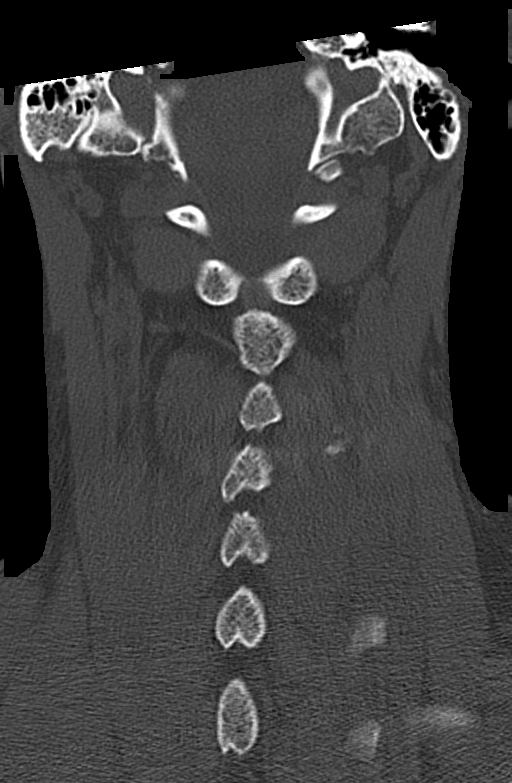

[14 of 47 positions shown; findings below may reference images not displayed]

FINDINGS: CT HEAD FINDINGS

Brain: No acute intracranial hemorrhage. No focal mass lesion. No CT
evidence of acute infarction. No midline shift or mass effect. No
hydrocephalus. Basilar cisterns are patent.

Vascular: No hyperdense vessel or unexpected calcification.

Skull: Normal. Negative for fracture or focal lesion.

Sinuses/Orbits: Paranasal sinuses and mastoid air cells are clear.
Orbits are clear.

Other: None.

CT CERVICAL SPINE FINDINGS

Alignment: Reversal normal cervical lordosis.  No subluxation.

Skull base and vertebrae: Normal craniocervical junction. No loss of
bowel vertebral body height or disc height. Normal facet
articulation. No evidence of fracture.

Soft tissues and spinal canal: No prevertebral soft tissue swelling.
No perispinal or epidural hematoma.

Disc levels:  Unremarkable

Upper chest: Clear

Other: None
IMPRESSION: 1. No intracranial trauma.
2. No cervical spine fracture.
3. Straightening of the normal cervical lordosis may be secondary to
position, muscle spasm, or ligamentous injury.

## 2018-08-21 ENCOUNTER — Emergency Department
Admission: EM | Admit: 2018-08-21 | Discharge: 2018-08-21 | Disposition: A | Discharge status: Home / Self Care | Payer: Medicare Other | Attending: Emergency Medicine | Admitting: Emergency Medicine

## 2018-08-21 ENCOUNTER — Emergency Department: Payer: Medicare Other

## 2018-08-21 ENCOUNTER — Other Ambulatory Visit: Payer: Self-pay

## 2018-08-21 DIAGNOSIS — F0390 Unspecified dementia without behavioral disturbance: Secondary | ICD-10-CM | POA: Insufficient documentation

## 2018-08-21 DIAGNOSIS — Z79899 Other long term (current) drug therapy: Secondary | ICD-10-CM | POA: Diagnosis not present

## 2018-08-21 DIAGNOSIS — I2510 Atherosclerotic heart disease of native coronary artery without angina pectoris: Secondary | ICD-10-CM | POA: Insufficient documentation

## 2018-08-21 DIAGNOSIS — G20 Parkinson's disease: Secondary | ICD-10-CM | POA: Insufficient documentation

## 2018-08-21 DIAGNOSIS — E039 Hypothyroidism, unspecified: Secondary | ICD-10-CM | POA: Diagnosis not present

## 2018-08-21 DIAGNOSIS — Z7901 Long term (current) use of anticoagulants: Secondary | ICD-10-CM | POA: Insufficient documentation

## 2018-08-21 DIAGNOSIS — R531 Weakness: Secondary | ICD-10-CM

## 2018-08-21 DIAGNOSIS — E860 Dehydration: Secondary | ICD-10-CM | POA: Insufficient documentation

## 2018-08-21 LAB — COMPREHENSIVE METABOLIC PANEL
ALT: 6 U/L (ref 0–44)
ANION GAP: 8 (ref 5–15)
AST: 132 U/L — AB (ref 15–41)
Albumin: 3.9 g/dL (ref 3.5–5.0)
Alkaline Phosphatase: 46 U/L (ref 38–126)
BUN: 38 mg/dL — AB (ref 8–23)
CHLORIDE: 105 mmol/L (ref 98–111)
CO2: 28 mmol/L (ref 22–32)
Calcium: 9.3 mg/dL (ref 8.9–10.3)
Creatinine, Ser: 1.28 mg/dL — ABNORMAL HIGH (ref 0.61–1.24)
GFR calc Af Amer: >60 mL/min (ref >60)
GFR calc non Af Amer: 57 mL/min — ABNORMAL LOW (ref >60)
Glucose, Bld: 105 mg/dL — ABNORMAL HIGH (ref 70–99)
POTASSIUM: 3.6 mmol/L (ref 3.5–5.1)
SODIUM: 141 mmol/L (ref 135–145)
Total Bilirubin: 1 mg/dL (ref 0.3–1.2)
Total Protein: 6.7 g/dL (ref 6.5–8.1)

## 2018-08-21 LAB — CBC WITH DIFFERENTIAL/PLATELET
ABS IMMATURE GRANULOCYTES: 0.06 10*3/uL (ref 0.00–0.07)
BASOS ABS: 0 10*3/uL (ref 0.0–0.1)
Basophils Relative: 0 %
EOS ABS: 0.1 10*3/uL (ref 0.0–0.5)
Eosinophils Relative: 1 %
HCT: 42 % (ref 39.0–52.0)
Hemoglobin: 13.7 g/dL (ref 13.0–17.0)
IMMATURE GRANULOCYTES: 0 %
Lymphocytes Relative: 12 %
Lymphs Abs: 1.6 10*3/uL (ref 0.7–4.0)
MCH: 30.9 pg (ref 26.0–34.0)
MCHC: 32.6 g/dL (ref 30.0–36.0)
MCV: 94.8 fL (ref 80.0–100.0)
MONOS PCT: 10 %
Monocytes Absolute: 1.4 10*3/uL — ABNORMAL HIGH (ref 0.1–1.0)
NEUTROS ABS: 10.7 10*3/uL — AB (ref 1.7–7.7)
NEUTROS PCT: 77 %
NRBC: 0 % (ref 0.0–0.2)
Platelets: 209 10*3/uL (ref 150–400)
RBC: 4.43 MIL/uL (ref 4.22–5.81)
RDW: 13.3 % (ref 11.5–15.5)
WBC: 13.8 10*3/uL — ABNORMAL HIGH (ref 4.0–10.5)

## 2018-08-21 LAB — URINALYSIS, COMPLETE (UACMP) WITH MICROSCOPIC
Bilirubin Urine: NEGATIVE
GLUCOSE, UA: NEGATIVE mg/dL
Ketones, ur: 5 mg/dL — AB
LEUKOCYTES UA: NEGATIVE
NITRITE: NEGATIVE
PROTEIN: 100 mg/dL — AB
Specific Gravity, Urine: 1.031 — ABNORMAL HIGH (ref 1.005–1.030)
pH: 5 (ref 5.0–8.0)

## 2018-08-21 LAB — TROPONIN I: Troponin I: <0.03 ng/mL (ref <0.03)

## 2018-08-21 LAB — LACTIC ACID, PLASMA: Lactic Acid, Venous: 1.4 mmol/L (ref 0.5–1.9)

## 2018-08-21 LAB — PROTIME-INR
INR: 2.95
PROTHROMBIN TIME: 30.3 s — AB (ref 11.4–15.2)

## 2018-08-21 MED ORDER — SODIUM CHLORIDE 0.9 % IV BOLUS
1000.0000 mL | Freq: Once | INTRAVENOUS | Status: AC
  Administered 2018-08-21: 1000 mL via INTRAVENOUS

## 2018-08-21 NOTE — ED Notes (Signed)
Pt discharged home after verbalizing understanding of discharge instructions; nad noted. 

## 2018-08-21 NOTE — ED Notes (Signed)
Pt given breakfast tray

## 2018-08-21 NOTE — ED Triage Notes (Signed)
Pt arrived from home via EMS with complaints of generalized weakness. Pt states that he was trying to walk from one side of his home to the other when he felt weak and then eased down to his knees. Pt has Hx of Parkinsons disease. VS per EMS were WNL. BP-165/93 HR-93 R-18 temp-97.6 oral. Pt alert and oriented x 4. PT denies any pain at this time

## 2018-08-21 NOTE — Care Management Note (Signed)
Case Management Note  Patient Details  Name: Douglas Silva. MRN: [id number] Date of Birth: [birth year]  Subjective/Objective:         Patient is being seen in the ED after falling last night.  His aide with [facility] found him when she came in for her shift last night at 9 pm.  The patient has personal aide services in during the day until 5pm and then the night aide comes in at 9pm and stays all night.  RNCM seeing patient to set up [place], PT and aide.  Patient has no preference of agency, he agreed to be set up with [facility].  Floydene Flock with [facility] aware and will set up [facility]     PCP verified as Dr. Threasa Alpha, he reports he saw her about a month ago and follows up about every 2 months for blood work.  Pharm[facility].  Address and contact info verified as correct in Epic.  Next of kin is his twin sister  Kandace Parkins who lives in [place].  Patient has a walker with 4 wheels and a seat.  He also has a shower chair.  He reports that he gets around well with the walker.  He did fall last night trying to get from one room to the next, he states that he does not fall often.  [facility] helps with ADL's bathing, dressing, preparing food and driving him to appointments.          Action/Plan: Discharge patient home with [facility] PT and aide. [facility] aware.   Expected Discharge Date:                  Expected Discharge Plan:  [place]  In-House Referral:  Clinical Social Work  Discharge planning Services  CM Consult  Post Acute Care Choice:    Choice offered to:     DME Arranged:    DME Agency:     HH Arranged:  PT, Nurse's Aide [place] Agency:  [place]  Status of Service:  Completed, signed off  If discussed at [place] of Stay Meetings, dates discussed:    Additional Comments:  Shelbie Hutching, RN 08/21/2018, 9:23 AM

## 2018-08-21 NOTE — Progress Notes (Addendum)
Clinical Education officer, museum (CSW) received consult for SNF placement. Patient has medicare and has not had a 3 night qualifying inpatient stay in a hospital in the past 30 days, so medicare will not pay for SNF. CSW also contacted [facility] to see if he would qualify for the 3 day waiver program. Per Crystal with [facility] patient does not qualify for that program. CSW contacted MD and made him aware of above and stated that patient could return home with home health PT and possibly an aide. RN case manager aware of above. Please reconsult if future social work needs arise. CSW signing off.   [employer], LCSW [phone]

## 2018-08-21 NOTE — ED Provider Notes (Signed)
We will plan for discharge, patient has Medicare and cannot be placed into a nursing facility at this time.  We have arranged for home physical therapy and nurses aide.   Earleen Newport, MD 08/21/18 [phone]

## 2018-08-21 NOTE — Progress Notes (Signed)
PT Cancellation Note  Patient Details Name: Douglas Silva. MRN: [id number] DOB: [birth year]   Cancelled Treatment:    Reason Eval/Treat Not Completed: Other (comment).  Spoke with Dr. Jimmye Norman who reports pt will be set up with HHPT at d/c and PT order will be discontinued.  Received discontinue order, PT signing off.    Collie Siad PT, DPT 08/21/2018, 10:46 AM

## 2018-08-21 NOTE — ED Provider Notes (Signed)
[facility] Emergency Department Provider Note   ____________________________________________   First MD Initiated Contact with Silva 08/21/18 0244     (approximate)  I have reviewed Douglas triage vital signs and [facility] nursing notes.   HISTORY  Chief Complaint Generalized weakness  Level V caveat: Limited by dementia; caregiver at bedside  HPI Douglas Silva. is a 67 y.o. male brought to [facility] ED from home via EMS with a chief complaint of generalized weakness.  Silva has a history of Parkinson's disease, CAD, atrial fibrillation on warfarin who was trying to walk from one side of his home to [facility] other when he felt generally weak and eased himself to his knees.  Caregiver was unable to get him back up.  Denies striking head or LOC.  Caregiver says last time this happened Silva had sepsis.  Silva denies recent fever, chills, chest pain, shortness of breath, abdominal pain, nausea or vomiting.  Has had a slight cough this weekend.  Denies recent travel or trauma.   Past Medical History:  Diagnosis Date  . Atrial fibrillation ([place])   . Cardiomyopathy ([place])   . Coronary artery disease   . Hypertension   . Hypothyroidism   . Parkinson's disease [facility])     Silva Active Problem List   Diagnosis Date Noted  . Sepsis ([place]) 06/01/2018  . AKI (acute kidney injury) ([place]) 09/03/2017  . Cellulitis 06/01/2017    Past Surgical History:  Procedure Laterality Date  . COLONOSCOPY N/A 04/10/2015   Procedure: COLONOSCOPY;  Surgeon: Manya Silvas, MD;  Location: [facility] ENDOSCOPY;  Service: Endoscopy;  Laterality: N/A;  . CORONARY ANGIOPLASTY    . DIALYSIS/PERMA CATHETER INSERTION N/A 09/05/2017   Procedure: DIALYSIS/PERMA CATHETER INSERTION;  Surgeon: Algernon Huxley, MD;  Location: [facility] CV LAB;  Service: Cardiovascular;  Laterality: N/A;    Prior to Admission medications   Medication Sig Start Date End Date Taking? Authorizing Provider    carbidopa-levodopa (SINEMET IR) 25-100 MG tablet Take 1 tablet by mouth 4 (four) times daily. 05/11/18   [provider]  cefUROXime (CEFTIN) 500 MG tablet Take 1 tablet (500 mg total) by mouth 2 (two) times daily with a meal. 06/02/18   Bettey Costa, MD  finasteride (PROSCAR) 5 MG tablet Take 1 tablet (5 mg total) by mouth daily. 09/16/17   Fritzi Mandes, MD  ibuprofen (ADVIL,MOTRIN) 400 MG tablet Take 1 tablet (400 mg total) by mouth every 8 (eight) hours as needed. 09/16/17   Fritzi Mandes, MD  levothyroxine (SYNTHROID, LEVOTHROID) 100 MCG tablet Take 100 mcg by mouth daily before breakfast.    [provider]  metoprolol succinate (TOPROL-XL) 25 MG 24 hr tablet Take 25 mg by mouth daily. 05/20/17   [provider]  Multiple Vitamin (MULTIVITAMIN WITH MINERALS) TABS tablet Take 1 tablet by mouth daily. 09/16/17   Fritzi Mandes, MD  polyethylene glycol [facility] / Floria Raveling) packet Take 17 g by mouth daily. 09/16/17   Fritzi Mandes, MD  simvastatin (ZOCOR) 20 MG tablet Take 1 tablet (20 mg total) by mouth daily. 09/23/17   Fritzi Mandes, MD  warfarin (COUMADIN) 6 MG tablet Take 1 tablet (6 mg total) by mouth daily. @@1700    Please do not take this on Friday, Saturday and Sunday 8/9-8/11. See PCP on Monday for INR check to restart 06/02/18   Bettey Costa, MD    Allergies Silva has no known allergies.  Family History  Problem Relation Age of Onset  . Lung cancer  Mother   . Lung cancer Father     Social History Social History   Tobacco Use  . Smoking status: Never Smoker  . Smokeless tobacco: Never Used  Substance Use Topics  . Alcohol use: No  . Drug use: No    Review of Systems  Constitutional: Positive for generalized weakness.  No fever/chills Eyes: No visual changes. ENT: No sore throat. Cardiovascular: Denies chest pain. Respiratory: Positive for cough.  Denies shortness of breath. Gastrointestinal: No abdominal pain.  No nausea, no vomiting.  No diarrhea.   No constipation. Genitourinary: Negative for dysuria. Musculoskeletal: Negative for back pain. Skin: Negative for rash. Neurological: Negative for headaches, focal weakness or numbness.   ____________________________________________   PHYSICAL EXAM:  VITAL SIGNS: ED Triage Vitals  Enc Vitals Group     BP 08/21/18 0241 129/90     Pulse Rate 08/21/18 0241 (!) 110     Resp 08/21/18 0241 18     Temp 08/21/18 0241 98.3 F (36.8 C)     Temp Source 08/21/18 0241 Oral     SpO2 08/21/18 0241 100 %     Weight 08/21/18 0242 170 lb (77.1 kg)     Height 08/21/18 0242 6' (1.829 m)     Head Circumference --      Peak Flow --      Pain Score 08/21/18 0242 0     Pain Loc --      Pain Edu? --      Excl. in [place]? --     Constitutional: Alert and oriented.  Frail appearing and in no acute distress. Eyes: Conjunctivae are normal. PERRL. EOMI. Head: Atraumatic. Nose: No congestion/rhinnorhea. Mouth/Throat: Mucous membranes are moist.  Oropharynx non-erythematous. Neck: No stridor.  Supple neck without meningismus. Cardiovascular: Tachycardic rate, irregular rhythm. Grossly normal heart sounds.  Good peripheral circulation. Respiratory: Normal respiratory effort.  No retractions. Lungs CTAB. Gastrointestinal: Soft and nontender. No distention. No abdominal bruits. No CVA tenderness. Musculoskeletal: No lower extremity tenderness nor edema.  No joint effusions. Neurologic: Alert and oriented to person and place which is baseline for Silva.  Normal speech and language. No gross focal neurologic deficits are appreciated. MAEx4.  Flat facies. Skin:  Skin is warm, dry and intact. No rash noted. Psychiatric: Mood and affect are normal. Speech and behavior are normal.  ____________________________________________   LABS (all labs ordered are listed, but only abnormal results are displayed)  Labs Reviewed  CBC WITH DIFFERENTIAL/PLATELET - Abnormal; Notable for Douglas following components:       Result Value   WBC 13.8 (*)    Neutro Abs 10.7 (*)    Monocytes Absolute 1.4 (*)    All other components within normal limits  COMPREHENSIVE METABOLIC PANEL - Abnormal; Notable for Douglas following components:   Glucose, Bld 105 (*)    BUN 38 (*)    Creatinine, Ser 1.28 (*)    AST 132 (*)    GFR calc non Af Amer 57 (*)    All other components within normal limits  URINALYSIS, COMPLETE (UACMP) WITH MICROSCOPIC - Abnormal; Notable for Douglas following components:   Color, Urine AMBER (*)    APPearance HAZY (*)    Specific Gravity, Urine 1.031 (*)    Hgb urine dipstick LARGE (*)    Ketones, ur 5 (*)    Protein, ur 100 (*)    Bacteria, UA RARE (*)    All other components within normal limits  PROTIME-INR - Abnormal; Notable for Douglas following components:  Prothrombin Time 30.3 (*)    All other components within normal limits  URINE CULTURE  CULTURE, BLOOD (ROUTINE X 2)  CULTURE, BLOOD (ROUTINE X 2)  TROPONIN I  LACTIC ACID, PLASMA   ____________________________________________  EKG  ED ECG REPORT I, Rickell Wiehe J, Douglas attending physician, personally viewed and interpreted this ECG.   Date: 08/21/2018  EKG Time: 0239  Rate: 109  Rhythm: atrial fibrillation, rate 109  Axis: Normal  Intervals:none  ST&T Change: Nonspecific  ____________________________________________  RADIOLOGY  ED MD interpretation: No acute cardiopulmonary process  Official radiology report(s): Dg Chest Port 1 View  Result Date: 08/21/2018 CLINICAL DATA:  67 year old male with weakness. EXAM: PORTABLE CHEST 1 VIEW COMPARISON:  Chest radiograph dated 06/01/2018 FINDINGS: Minimal left lung base atelectasis/scarring. No focal consolidation, pleural effusion, or pneumothorax. Stable cardiac silhouette. Probable small hiatal hernia. No acute osseous pathology. IMPRESSION: No active disease. Electronically Signed   By: Anner Crete M.D.   On: 08/21/2018 04:39     ____________________________________________   PROCEDURES  Procedure(s) performed: None  Procedures  Critical Care performed: No  ____________________________________________   INITIAL IMPRESSION / ASSESSMENT AND PLAN / ED COURSE  As part of my medical decision making, I reviewed Douglas following data within Douglas electronic MEDICAL RECORD NUMBER History obtained from family, Nursing notes reviewed and incorporated, Labs reviewed, EKG interpreted, Old chart reviewed, Radiograph reviewed and Notes from prior ED visits   68 year old male with Parkinson's, atrial fibrillation on warfarin who presents with generalized weakness.  History of sepsis.  Differential diagnosis includes but is not limited to CAD, CVA, infectious, metabolic etiologies, etc.  Given Silva's history of sepsis with similar presentation, will obtain blood cultures, lactate.  In addition to screening lab work, check PT/INR as Silva is on warfarin.  Obtain urinalysis and chest x-ray.  Clinical Course as of Aug 21 629  Mon Aug 21, 2018  0450 Encouraged Silva to urinate which he cannot do yet.  IV fluids infusing.   [JS]  0609 Updated Silva and caregiver of all test results.  There is no admission criteria.  However, according to Silva and caregiver, he was too weak to get up at home and she could not get him up by herself.  Will keep Silva for clinical social work and physical therapy evaluations this morning for potential placement.   [JS]    Clinical Course User Index [JS] Paulette Blanch, MD     ____________________________________________   FINAL CLINICAL IMPRESSION(S) / ED DIAGNOSES  Final diagnoses:  Generalized weakness  Dehydration     ED Discharge Orders    None       Note:  This document was prepared using Dragon voice recognition software and may include unintentional dictation errors.    Paulette Blanch, MD 08/21/18 [phone]

## 2018-08-21 NOTE — ED Notes (Signed)
[facility] called waiting on transport home

## 2018-08-22 LAB — URINE CULTURE: Culture: NO GROWTH

## 2018-08-26 LAB — CULTURE, BLOOD (ROUTINE X 2)
CULTURE: NO GROWTH
Culture: NO GROWTH
Special Requests: ADEQUATE
Special Requests: ADEQUATE

## 2018-09-11 ENCOUNTER — Ambulatory Visit: Admit: 2018-09-11 | Payer: Medicare Other | Admitting: Unknown Physician Specialty

## 2018-09-11 SURGERY — COLONOSCOPY WITH PROPOFOL
Anesthesia: General

## 2018-10-11 ENCOUNTER — Emergency Department: Payer: Medicare Other

## 2018-10-11 ENCOUNTER — Inpatient Hospital Stay
Admission: EM | Admit: 2018-10-11 | Discharge: 2018-10-17 | DRG: 641 | Disposition: A | Discharge status: Skilled Nursing Facility | Payer: Medicare Other | Attending: Internal Medicine | Admitting: Internal Medicine

## 2018-10-11 ENCOUNTER — Other Ambulatory Visit: Payer: Self-pay

## 2018-10-11 DIAGNOSIS — I959 Hypotension, unspecified: Secondary | ICD-10-CM | POA: Diagnosis present

## 2018-10-11 DIAGNOSIS — E039 Hypothyroidism, unspecified: Secondary | ICD-10-CM | POA: Diagnosis present

## 2018-10-11 DIAGNOSIS — I2510 Atherosclerotic heart disease of native coronary artery without angina pectoris: Secondary | ICD-10-CM | POA: Diagnosis present

## 2018-10-11 DIAGNOSIS — Z9861 Coronary angioplasty status: Secondary | ICD-10-CM

## 2018-10-11 DIAGNOSIS — W19XXXA Unspecified fall, initial encounter: Secondary | ICD-10-CM | POA: Diagnosis present

## 2018-10-11 DIAGNOSIS — N400 Enlarged prostate without lower urinary tract symptoms: Secondary | ICD-10-CM | POA: Diagnosis present

## 2018-10-11 DIAGNOSIS — Y92009 Unspecified place in unspecified non-institutional (private) residence as the place of occurrence of the external cause: Secondary | ICD-10-CM

## 2018-10-11 DIAGNOSIS — Z7901 Long term (current) use of anticoagulants: Secondary | ICD-10-CM

## 2018-10-11 DIAGNOSIS — I951 Orthostatic hypotension: Secondary | ICD-10-CM | POA: Diagnosis present

## 2018-10-11 DIAGNOSIS — G20 Parkinson's disease: Secondary | ICD-10-CM | POA: Diagnosis present

## 2018-10-11 DIAGNOSIS — E860 Dehydration: Secondary | ICD-10-CM | POA: Diagnosis not present

## 2018-10-11 DIAGNOSIS — R7989 Other specified abnormal findings of blood chemistry: Secondary | ICD-10-CM | POA: Diagnosis present

## 2018-10-11 DIAGNOSIS — I129 Hypertensive chronic kidney disease with stage 1 through stage 4 chronic kidney disease, or unspecified chronic kidney disease: Secondary | ICD-10-CM | POA: Diagnosis present

## 2018-10-11 DIAGNOSIS — N183 Chronic kidney disease, stage 3 (moderate): Secondary | ICD-10-CM | POA: Diagnosis present

## 2018-10-11 DIAGNOSIS — Z79899 Other long term (current) drug therapy: Secondary | ICD-10-CM

## 2018-10-11 DIAGNOSIS — R55 Syncope and collapse: Secondary | ICD-10-CM

## 2018-10-11 DIAGNOSIS — I429 Cardiomyopathy, unspecified: Secondary | ICD-10-CM | POA: Diagnosis present

## 2018-10-11 DIAGNOSIS — R531 Weakness: Secondary | ICD-10-CM

## 2018-10-11 DIAGNOSIS — R778 Other specified abnormalities of plasma proteins: Secondary | ICD-10-CM

## 2018-10-11 DIAGNOSIS — N179 Acute kidney failure, unspecified: Secondary | ICD-10-CM | POA: Diagnosis present

## 2018-10-11 DIAGNOSIS — I482 Chronic atrial fibrillation, unspecified: Secondary | ICD-10-CM | POA: Diagnosis present

## 2018-10-11 DIAGNOSIS — R269 Unspecified abnormalities of gait and mobility: Secondary | ICD-10-CM | POA: Diagnosis present

## 2018-10-11 HISTORY — DX: Benign prostatic hyperplasia without lower urinary tract symptoms: N40.0

## 2018-10-11 LAB — CBC WITH DIFFERENTIAL/PLATELET
Abs Immature Granulocytes: 0.05 10*3/uL (ref 0.00–0.07)
Basophils Absolute: 0 10*3/uL (ref 0.0–0.1)
Basophils Relative: 0 %
Eosinophils Absolute: 0.1 10*3/uL (ref 0.0–0.5)
Eosinophils Relative: 1 %
HCT: 43 % (ref 39.0–52.0)
Hemoglobin: 14.1 g/dL (ref 13.0–17.0)
Immature Granulocytes: 1 %
Lymphocytes Relative: 16 %
Lymphs Abs: 1.7 10*3/uL (ref 0.7–4.0)
MCH: 31.4 pg (ref 26.0–34.0)
MCHC: 32.8 g/dL (ref 30.0–36.0)
MCV: 95.8 fL (ref 80.0–100.0)
Monocytes Absolute: 1.3 10*3/uL — ABNORMAL HIGH (ref 0.1–1.0)
Monocytes Relative: 12 %
Neutro Abs: 7.2 10*3/uL (ref 1.7–7.7)
Neutrophils Relative %: 70 %
Platelets: 229 10*3/uL (ref 150–400)
RBC: 4.49 MIL/uL (ref 4.22–5.81)
RDW: 13.2 % (ref 11.5–15.5)
WBC: 10.3 10*3/uL (ref 4.0–10.5)
nRBC: 0 % (ref 0.0–0.2)

## 2018-10-11 LAB — COMPREHENSIVE METABOLIC PANEL
ALT: 40 U/L (ref 0–44)
ANION GAP: 8 (ref 5–15)
AST: 164 U/L — ABNORMAL HIGH (ref 15–41)
Albumin: 4.1 g/dL (ref 3.5–5.0)
Alkaline Phosphatase: 51 U/L (ref 38–126)
BUN: 36 mg/dL — ABNORMAL HIGH (ref 8–23)
CALCIUM: 9.3 mg/dL (ref 8.9–10.3)
CO2: 26 mmol/L (ref 22–32)
Chloride: 105 mmol/L (ref 98–111)
Creatinine, Ser: 1.44 mg/dL — ABNORMAL HIGH (ref 0.61–1.24)
GFR calc Af Amer: 58 mL/min — ABNORMAL LOW (ref >60)
GFR, EST NON AFRICAN AMERICAN: 50 mL/min — AB (ref >60)
Glucose, Bld: 102 mg/dL — ABNORMAL HIGH (ref 70–99)
POTASSIUM: 3.7 mmol/L (ref 3.5–5.1)
Sodium: 139 mmol/L (ref 135–145)
TOTAL PROTEIN: 7.3 g/dL (ref 6.5–8.1)
Total Bilirubin: 1.4 mg/dL — ABNORMAL HIGH (ref 0.3–1.2)

## 2018-10-11 LAB — TROPONIN I
Troponin I: 0.03 ng/mL (ref <0.03)
Troponin I: <0.03 ng/mL (ref <0.03)
Troponin I: <0.03 ng/mL (ref <0.03)

## 2018-10-11 LAB — URINALYSIS, COMPLETE (UACMP) WITH MICROSCOPIC
Bacteria, UA: NONE SEEN
Bilirubin Urine: NEGATIVE
Glucose, UA: NEGATIVE mg/dL
Hgb urine dipstick: NEGATIVE
Ketones, ur: 20 mg/dL — AB
Leukocytes, UA: NEGATIVE
Nitrite: NEGATIVE
Protein, ur: NEGATIVE mg/dL
Specific Gravity, Urine: >1.046 — ABNORMAL HIGH (ref 1.005–1.030)
Squamous Epithelial / LPF: NONE SEEN (ref 0–5)
pH: 5 (ref 5.0–8.0)

## 2018-10-11 LAB — PROTIME-INR
INR: 3.27
Prothrombin Time: 32.8 seconds — ABNORMAL HIGH (ref 11.4–15.2)

## 2018-10-11 MED ORDER — ACETAMINOPHEN 650 MG RE SUPP: 650.0000 mg | Freq: Four times a day (QID) | RECTAL | Status: DC | PRN

## 2018-10-11 MED ORDER — DOCUSATE SODIUM 100 MG PO CAPS
100.0000 mg | ORAL_CAPSULE | Freq: Two times a day (BID) | ORAL | Status: DC
  Administered 2018-10-11 – 2018-10-17 (×12): 100 mg via ORAL
  Filled 2018-10-11 (×12): qty 1

## 2018-10-11 MED ORDER — SIMVASTATIN 20 MG PO TABS
20.0000 mg | ORAL_TABLET | Freq: Every day | ORAL | Status: DC
  Administered 2018-10-11 – 2018-10-16 (×6): 20 mg via ORAL
  Filled 2018-10-11 (×4): qty 1
  Filled 2018-10-11: qty 2
  Filled 2018-10-11 (×2): qty 1

## 2018-10-11 MED ORDER — ONDANSETRON HCL 4 MG/2ML IJ SOLN: 4.0000 mg | Freq: Four times a day (QID) | INTRAMUSCULAR | Status: DC | PRN

## 2018-10-11 MED ORDER — WARFARIN SODIUM 4 MG PO TABS
4.0000 mg | ORAL_TABLET | Freq: Once | ORAL | Status: AC
  Administered 2018-10-11: 4 mg via ORAL
  Filled 2018-10-11: qty 1

## 2018-10-11 MED ORDER — SODIUM CHLORIDE 0.9 % IV BOLUS
1000.0000 mL | Freq: Once | INTRAVENOUS | Status: AC
  Administered 2018-10-11: 1000 mL via INTRAVENOUS

## 2018-10-11 MED ORDER — LEVOTHYROXINE SODIUM 100 MCG PO TABS
100.0000 ug | ORAL_TABLET | Freq: Every day | ORAL | Status: DC
  Administered 2018-10-12 – 2018-10-17 (×6): 100 ug via ORAL
  Filled 2018-10-11 (×2): qty 1
  Filled 2018-10-11: qty 2
  Filled 2018-10-11 (×2): qty 1
  Filled 2018-10-11: qty 2
  Filled 2018-10-11: qty 1

## 2018-10-11 MED ORDER — CARBIDOPA-LEVODOPA 25-250 MG PO TABS
1.0000 | ORAL_TABLET | ORAL | Status: AC
  Administered 2018-10-11: 1 via ORAL
  Filled 2018-10-11: qty 1

## 2018-10-11 MED ORDER — IOPAMIDOL (ISOVUE-370) INJECTION 76%
75.0000 mL | Freq: Once | INTRAVENOUS | Status: AC | PRN
  Administered 2018-10-11: 75 mL via INTRAVENOUS

## 2018-10-11 MED ORDER — WARFARIN - PHARMACIST DOSING INPATIENT
Freq: Every day | Status: DC
  Administered 2018-10-11 – 2018-10-16 (×4)
  Filled 2018-10-11: qty 1

## 2018-10-11 MED ORDER — SODIUM CHLORIDE 0.9 % IV SOLN
INTRAVENOUS | Status: DC
  Administered 2018-10-11 – 2018-10-15 (×9): via INTRAVENOUS

## 2018-10-11 MED ORDER — FINASTERIDE 5 MG PO TABS
5.0000 mg | ORAL_TABLET | Freq: Every day | ORAL | Status: DC
  Administered 2018-10-11 – 2018-10-17 (×7): 5 mg via ORAL
  Filled 2018-10-11 (×7): qty 1

## 2018-10-11 MED ORDER — ONDANSETRON HCL 4 MG PO TABS: 4.0000 mg | ORAL_TABLET | Freq: Four times a day (QID) | ORAL | Status: DC | PRN

## 2018-10-11 MED ORDER — ACETAMINOPHEN 325 MG PO TABS
650.0000 mg | ORAL_TABLET | Freq: Four times a day (QID) | ORAL | Status: DC | PRN
  Administered 2018-10-13: 650 mg via ORAL
  Filled 2018-10-11: qty 2

## 2018-10-11 MED ORDER — CARBIDOPA-LEVODOPA 25-100 MG PO TABS
1.0000 | ORAL_TABLET | Freq: Four times a day (QID) | ORAL | Status: DC
  Administered 2018-10-11 – 2018-10-17 (×23): 1 via ORAL
  Filled 2018-10-11 (×28): qty 1

## 2018-10-11 NOTE — ED Notes (Signed)
STELLA PRIDE (CAREGIVER) [phone]

## 2018-10-11 NOTE — ED Provider Notes (Signed)
  Physical Exam  BP 90/65   Pulse 87   Temp (!) 97.5 F (36.4 C) (Oral)   Resp 15   Ht 6' (1.829 m)   Wt 77.1 kg   SpO2 97%   BMI 23.06 kg/m   Physical Exam  ED Course/Procedures     Procedures  MDM  Patient care assumed at 7 am.  Patient here with possible syncope.  Patient is from home and the visiting aide came to visit him this morning and found him on the floor.  Patient told me that he walks to the bathroom and may have passed out but patient is a very poor historian.  He was noted to be hypotensive initially.  Signed out pending labs and CT head.  7:30 am Patient's CT showed possible L frontal lobe bleed. I called Dr. Cari Caraway from neurosurgery, who recommend CTA head and likely repeat CT head w/o contrast in 6 hours.   9:03 AM CTA showed no bleed. Thought previous imaging was likely artifact. I talked to Dr. Cari Caraway again and he states that patient is cleared from neurosurgical perspective. Trop 0.03. I think patient likely has syncope from hypotension. WBC nl, CXR nl. UA pending. BP improved with IVF. Hospitalist to admit for syncope, hypotension.   CRITICAL CARE Performed by: Wandra Arthurs   Total critical care time: 30 minutes  Critical care time was exclusive of separately billable procedures and treating other patients.  Critical care was necessary to treat or prevent imminent or life-threatening deterioration.  Critical care was time spent personally by me on the following activities: development of treatment plan with patient and/or surrogate as well as nursing, discussions with consultants, evaluation of patient's response to treatment, examination of patient, obtaining history from patient or surrogate, ordering and performing treatments and interventions, ordering and review of laboratory studies, ordering and review of radiographic studies, pulse oximetry and re-evaluation of patient's condition.        Drenda Freeze, MD 10/11/18 [phone]

## 2018-10-11 NOTE — ED Notes (Signed)
Patient transported to CT 

## 2018-10-11 NOTE — Progress Notes (Signed)
PT Cancellation Note  Patient Details Name: Douglas Silva. MRN: [id number] DOB: 1951-02-15   Cancelled Treatment:    Reason Eval/Treat Not Completed: Medical issues which prohibited therapy;Other (comment)(PT spoke with nursing, pt currently very orthostatic with any upright positioning, would like PT to hold until AM. PT spoke with pt, pt also would like PT to attempt tomorrow. PT will follow up as able.)   Lieutenant Diego PT, DPT 3:44 PM,10/11/18 [phone]

## 2018-10-11 NOTE — Consult Note (Signed)
Patient reviewed with ER Dr. Darl Householder.  67 yo male with history of parkinson disease who presents with likely fall, but unclear history.  Found to have small R F IPH potentially traumatic versus amyloid angiopathy.  At baseline condition per report.  Plan)  CTA Head neck to eval for vascular lesion Repeat HCT around 1300 If requires admission, would be appropriate for Medicine admission with NSGY following as consult  Full note to follow  Meade Maw MD

## 2018-10-11 NOTE — H&P (Signed)
[facility] [place] NAME: Douglas Silva    MR#:  [id number]  DATE OF BIRTH:  28-Jun-1951  DATE OF ADMISSION:  10/11/2018  PRIMARY CARE PHYSICIAN: Remi Haggard, FNP   REQUESTING/REFERRING PHYSICIAN: Dr Lance Sell  CHIEF COMPLAINT:   Chief Complaint  Patient presents with  . Weakness    HISTORY OF PRESENT ILLNESS:  Douglas Silva  is a 67 y.o. male with a known history of Parkinson's presents after a fall.  He said he was trying to get back into the bed.  He normally walks with a walker.  He has caregivers almost 24/7.  He states that his caregiver was trying to guide him down the ground and she fell also.  No loss of consciousness.  Some pain with his elbow with the fall.  Patient states that he was recently started on Flomax.  In the ER, he was found to be hypotensive initially with a blood pressure in the 80s which improved with some IV fluids.  PAST MEDICAL HISTORY:   Past Medical History:  Diagnosis Date  . Atrial fibrillation ([place])   . BPH (benign prostatic hyperplasia)   . Cardiomyopathy ([place])   . Coronary artery disease   . Hypertension   . Hypothyroidism   . Parkinson's disease ([place])     PAST SURGICAL HISTORY:   Past Surgical History:  Procedure Laterality Date  . COLONOSCOPY N/A 04/10/2015   Procedure: COLONOSCOPY;  Surgeon: Manya Silvas, MD;  Location: [facility] ENDOSCOPY;  Service: Endoscopy;  Laterality: N/A;  . CORONARY ANGIOPLASTY    . DIALYSIS/PERMA CATHETER INSERTION N/A 09/05/2017   Procedure: DIALYSIS/PERMA CATHETER INSERTION;  Surgeon: Algernon Huxley, MD;  Location: [facility];  Service: Cardiovascular;  Laterality: N/A;    SOCIAL HISTORY:   Social History   Tobacco Use  . Smoking status: Never Smoker  . Smokeless tobacco: Never Used  Substance Use Topics  . Alcohol use: No    FAMILY HISTORY:   Family History  Problem Relation Age of Onset  . Lung cancer Mother   .  Esophageal cancer Father     DRUG ALLERGIES:  No Known Allergies  REVIEW OF SYSTEMS:  CONSTITUTIONAL: No fever, fatigue or weakness.  EYES: No blurred or double vision.  EARS, NOSE, AND THROAT: No tinnitus or ear pain. No sore throat.  Positive for runny nose RESPIRATORY: Some cough and shortness of breath.  No wheezing or hemoptysis.  CARDIOVASCULAR: No chest pain, orthopnea, edema.  GASTROINTESTINAL: No nausea, vomiting, diarrhea or abdominal pain. No blood in bowel movements GENITOURINARY: No dysuria, hematuria.  ENDOCRINE: No polyuria, nocturia,  HEMATOLOGY: No anemia, easy bruising or bleeding SKIN: No rash or lesion. MUSCULOSKELETAL: Some elbow pain. NEUROLOGIC: No tingling, numbness, weakness.  PSYCHIATRY: No anxiety or depression.   MEDICATIONS AT HOME:   Prior to Admission medications   Medication Sig Start Date End Date Taking? Authorizing Provider  carbidopa-levodopa (SINEMET IR) 25-100 MG tablet Take 1 tablet by mouth 4 (four) times daily. 05/11/18  Yes [provider]  ibuprofen (ADVIL,MOTRIN) 400 MG tablet Take 1 tablet (400 mg total) by mouth every 8 (eight) hours as needed. Patient taking differently: Take 400 mg by mouth every 8 (eight) hours as needed for mild pain or moderate pain.  09/16/17  Yes Fritzi Mandes, MD  levothyroxine (SYNTHROID, LEVOTHROID) 100 MCG tablet Take 100 mcg by mouth daily before breakfast.   Yes [provider]  metoprolol succinate (TOPROL-XL) 25 MG  24 hr tablet Take 25 mg by mouth daily. 05/20/17  Yes [provider]  simvastatin (ZOCOR) 20 MG tablet Take 1 tablet (20 mg total) by mouth daily. Patient taking differently: Take 20 mg by mouth at bedtime.  09/23/17  Yes Fritzi Mandes, MD  tamsulosin (FLOMAX) 0.4 MG CAPS capsule Take 0.4 mg by mouth daily.   Yes [provider]  warfarin (COUMADIN) 5 MG tablet Take 5 mg by mouth at bedtime.    Yes [provider]  cefUROXime (CEFTIN) 500 MG tablet Take 1  tablet (500 mg total) by mouth 2 (two) times daily with a meal. Patient not taking: Reported on 08/21/2018 06/02/18   Bettey Costa, MD  finasteride (PROSCAR) 5 MG tablet Take 1 tablet (5 mg total) by mouth daily. Patient not taking: Reported on 08/21/2018 09/16/17   Fritzi Mandes, MD  warfarin (COUMADIN) 6 MG tablet Take 1 tablet (6 mg total) by mouth daily. @@1700    Please do not take this on Friday, Saturday and Sunday 8/9-8/11. See PCP on Monday for INR check to restart Patient not taking: Reported on 08/21/2018 06/02/18   Bettey Costa, MD   Medication reconciliation still undergoing  VITAL SIGNS:  Blood pressure (!) 89/66, pulse 86, temperature (!) 97.5 F (36.4 C), temperature source Oral, resp. rate 14, height 6' (1.829 m), weight 77.1 kg, SpO2 98 %. Last blood pressure when I was in the room was 105/72 PHYSICAL EXAMINATION:  GENERAL:  67 y.o.-year-old patient lying in the bed with no acute distress.  EYES: Pupils equal, round, reactive to light and accommodation. No scleral icterus. Extraocular muscles intact.  HEENT: Head atraumatic, normocephalic. Oropharynx and nasopharynx clear.  NECK:  Supple, no jugular venous distention. No thyroid enlargement, no tenderness.  LUNGS: Normal breath sounds bilaterally, no wheezing, rales,rhonchi or crepitation. No use of accessory muscles of respiration.  CARDIOVASCULAR: S1, S2 irregularly irregular. No murmurs, rubs, or gallops.  ABDOMEN: Soft, nontender, nondistended. Bowel sounds present. No organomegaly or mass.  EXTREMITIES: No pedal edema, cyanosis, or clubbing.  NEUROLOGIC: Cranial nerves II through XII are intact. Muscle strength 5/5 in all extremities. Sensation intact. Gait not checked.  Resting tremor PSYCHIATRIC: The patient is alert and oriented x 3.  SKIN: No rash, lesion, or ulcer.   LABORATORY PANEL:   CBC Recent Labs  Lab 10/11/18 0537  WBC 10.3  HGB 14.1  HCT 43.0  PLT 229    ------------------------------------------------------------------------------------------------------------------  Chemistries  Recent Labs  Lab 10/11/18 0537  NA 139  K 3.7  CL 105  CO2 26  GLUCOSE 102*  BUN 36*  CREATININE 1.44*  CALCIUM 9.3  AST 164*  ALT 40  ALKPHOS 51  BILITOT 1.4*   ------------------------------------------------------------------------------------------------------------------  Cardiac Enzymes Recent Labs  Lab 10/11/18 0537  TROPONINI 0.03*   ------------------------------------------------------------------------------------------------------------------  RADIOLOGY:  Ct Angio Head W Or Wo Contrast  Result Date: 10/11/2018 CLINICAL DATA:  Fall.  Intracranial hemorrhage on CT EXAM: CT ANGIOGRAPHY HEAD AND NECK TECHNIQUE: Multidetector CT imaging of the head and neck was performed using the standard protocol during bolus administration of intravenous contrast. Multiplanar CT image reconstructions and MIPs were obtained to evaluate the vascular anatomy. Carotid stenosis measurements (when applicable) are obtained utilizing NASCET criteria, using the distal internal carotid diameter as the denominator. CONTRAST:  32mL ISOVUE-370 IOPAMIDOL (ISOVUE-370) INJECTION 76% COMPARISON:  CT head 10/11/2018 FINDINGS: CTA NECK FINDINGS Aortic arch: Standard branching. Imaged portion shows no evidence of aneurysm or dissection. No significant stenosis of the major arch vessel  origins. Right carotid system: Normal right carotid. Negative for stenosis or atherosclerotic disease Left carotid system: Normal left carotid. Negative for stenosis or atherosclerotic disease Vertebral arteries: Both vertebral arteries are normal and patent to the basilar. Skeleton: Cervical kyphosis likely positional. No acute skeletal abnormality. Other neck: Negative for mass or adenopathy Upper chest: Lung apices clear bilaterally. Review of the MIP images confirms the above findings CTA HEAD  FINDINGS Anterior circulation: Cavernous carotid widely patent. Negative for aneurysm or stenosis. No significant atherosclerotic disease. Anterior and middle cerebral arteries are widely patent bilaterally. Negative for intracranial hemorrhage. The hyperdensity in the right lower frontal lobe on the earlier CT is consistent with streak artifact from the adjacent bony structures. Posterior circulation: Both vertebral arteries widely patent to the basilar. Basilar widely patent. PICA, superior cerebellar, and posterior cerebral arteries are normal bilaterally. No stenosis or aneurysm. Venous sinuses: Patent Anatomic variants: None Delayed phase: Normal enhancement on delayed hemorrhage imaging. Negative for hemorrhage mass or infarction. Review of the MIP images confirms the above findings IMPRESSION: 1. Negative for intracranial hemorrhage. Review of the recent head CT demonstrates streak artifact in the right frontal lobe but no hemorrhage 2. Negative CTA head neck. Negative for intracranial or extracranial stenosis. Negative for vascular malformation. Electronically Signed   By: Franchot Gallo M.D.   On: 10/11/2018 08:33   Dg Chest 1 View  Result Date: 10/11/2018 CLINICAL DATA:  Altered mental status.  Hypertension. EXAM: CHEST  1 VIEW COMPARISON:  August 21, 2018 FINDINGS: There is mild atelectatic change and scarring in the left base. There is no edema or consolidation. Heart is borderline enlarged with pulmonary vascularity normal. No adenopathy. There is a probable hiatal hernia. IMPRESSION: Slight scarring and atelectasis left base. No edema or consolidation. Stable cardiac prominence. Apparent hiatal hernia. Electronically Signed   By: Lowella Grip III M.D.   On: 10/11/2018 07:23   Ct Head Wo Contrast  Result Date: 10/11/2018 CLINICAL DATA:  Altered mental status.  Parkinson's disease EXAM: CT HEAD WITHOUT CONTRAST TECHNIQUE: Contiguous axial images were obtained from the base of the skull  through the vertex without intravenous contrast. COMPARISON:  None. FINDINGS: Brain: There is age related volume loss. There is a focal area of hemorrhage in the anterior inferior right frontal lobe, best appreciated on coronal and sagittal images. No other hemorrhage is evident. There is no mass, extra-axial fluid collection, or midline shift. There is mild small vessel disease in the centra semiovale bilaterally. Brain parenchyma elsewhere appears unremarkable. Vascular: No hyperdense vessel. There is no appreciable vascular calcification. Skull: Bony calvarium appears intact. Sinuses/Orbits: There is mucosal thickening in the right maxillary antrum. There is mucosal thickening in several ethmoid air cells. Orbits appear symmetric bilaterally. There is evidence of cataract removals bilaterally. Other: Mastoid air cells are clear. IMPRESSION: Focal hemorrhage inferior right frontal lobe region. Suspect amyloid angiopathy given this appearance. No other foci of hemorrhage evident. There is age related volume loss with mild periventricular small vessel disease. There is mucosal thickening in several paranasal sinus regions. Critical Value/emergent results were called by telephone at the time of interpretation on 10/11/2018 at 7:21 am to Dr. Lurline Hare , who verbally acknowledged these results. Electronically Signed   By: Lowella Grip III M.D.   On: 10/11/2018 07:21   Ct Angio Neck W And/or Wo Contrast  Result Date: 10/11/2018 CLINICAL DATA:  Fall.  Intracranial hemorrhage on CT EXAM: CT ANGIOGRAPHY HEAD AND NECK TECHNIQUE: Multidetector CT imaging of the head and  neck was performed using the standard protocol during bolus administration of intravenous contrast. Multiplanar CT image reconstructions and MIPs were obtained to evaluate the vascular anatomy. Carotid stenosis measurements (when applicable) are obtained utilizing NASCET criteria, using the distal internal carotid diameter as the denominator.  CONTRAST:  68mL ISOVUE-370 IOPAMIDOL (ISOVUE-370) INJECTION 76% COMPARISON:  CT head 10/11/2018 FINDINGS: CTA NECK FINDINGS Aortic arch: Standard branching. Imaged portion shows no evidence of aneurysm or dissection. No significant stenosis of the major arch vessel origins. Right carotid system: Normal right carotid. Negative for stenosis or atherosclerotic disease Left carotid system: Normal left carotid. Negative for stenosis or atherosclerotic disease Vertebral arteries: Both vertebral arteries are normal and patent to the basilar. Skeleton: Cervical kyphosis likely positional. No acute skeletal abnormality. Other neck: Negative for mass or adenopathy Upper chest: Lung apices clear bilaterally. Review of the MIP images confirms the above findings CTA HEAD FINDINGS Anterior circulation: Cavernous carotid widely patent. Negative for aneurysm or stenosis. No significant atherosclerotic disease. Anterior and middle cerebral arteries are widely patent bilaterally. Negative for intracranial hemorrhage. The hyperdensity in the right lower frontal lobe on the earlier CT is consistent with streak artifact from the adjacent bony structures. Posterior circulation: Both vertebral arteries widely patent to the basilar. Basilar widely patent. PICA, superior cerebellar, and posterior cerebral arteries are normal bilaterally. No stenosis or aneurysm. Venous sinuses: Patent Anatomic variants: None Delayed phase: Normal enhancement on delayed hemorrhage imaging. Negative for hemorrhage mass or infarction. Review of the MIP images confirms the above findings IMPRESSION: 1. Negative for intracranial hemorrhage. Review of the recent head CT demonstrates streak artifact in the right frontal lobe but no hemorrhage 2. Negative CTA head neck. Negative for intracranial or extracranial stenosis. Negative for vascular malformation. Electronically Signed   By: Franchot Gallo M.D.   On: 10/11/2018 08:33    EKG:   Atrial fibrillation 97  bpm  IMPRESSION AND PLAN:   1.  Hypotension.  Give IV fluid hydration.  Hold Flomax.  Check orthostatic vital signs.  With Parkinson's disease, the Flomax may not be a good medication for him. 2.  Parkinson's disease.  Give a stat dose of Sinemet because he did not take it this morning.  Continue his Sinemet 3.  Fall and possible syncope.  Watch on telemetry overnight.  Give IV fluids.  Physical therapy evaluation. 4.  Atrial fibrillation.  Monitor on telemetry.  Pharmacy to dose Coumadin. 5.  BPH.  Use Proscar instead. 6.  Initial CT scan of the head could not rule out a bleed.  ER physician spoke with neurosurgery and they recommended a CT Angie of the head and neck which was negative for hemorrhage. 7.  Dehydration with acute kidney injury on chronic kidney disease stage III.  Monitor with IV fluids.  All the records are reviewed and case discussed with ED provider. Management plans discussed with the patient, and he is in agreement.  CODE STATUS: Full code  TOTAL TIME TAKING CARE OF THIS PATIENT: 50 minutes, including ACP time.    Loletha Grayer M.D on 10/11/2018 at 9:27 AM  Between 7am to 6pm - Pager - [phone]  After 6pm call admission pager [phone]  [facility] Office  [phone]  CC: Primary care physician; Remi Haggard, FNP

## 2018-10-11 NOTE — ED Provider Notes (Signed)
[facility] Emergency Department Provider Note   ____________________________________________   First MD Initiated Contact with Patient 10/11/18 [phone]     (approximate)  I have reviewed the triage vital signs and the nursing notes.   HISTORY  Chief Complaint Weakness    HPI Douglas Haik. is a 67 y.o. male who presents to the emergency department from home with a chief complaint of generalized weakness. Patient has home health but with gaps of 4 hours in the morning and evening.  His nighttime nurse came at 9 PM and found him on the floor. Patient had fallen 1 hour prior due to generalized weakness.  Complains of buttocks pain.  Denies striking head or LOC.  Denies fever, chills, headache, vision changes, neck pain, chest pain, shortness breath, abdominal pain, nausea or vomiting.     Past Medical History:  Diagnosis Date  . Atrial fibrillation ([place])   . Cardiomyopathy ([place])   . Coronary artery disease   . Hypertension   . Hypothyroidism   . Parkinson's disease [facility])     Patient Active Problem List   Diagnosis Date Noted  . Sepsis ([place]) 06/01/2018  . AKI (acute kidney injury) ([place]) 09/03/2017  . Cellulitis 06/01/2017    Past Surgical History:  Procedure Laterality Date  . COLONOSCOPY N/A 04/10/2015   Procedure: COLONOSCOPY;  Surgeon: Manya Silvas, MD;  Location: [facility] ENDOSCOPY;  Service: Endoscopy;  Laterality: N/A;  . CORONARY ANGIOPLASTY    . DIALYSIS/PERMA CATHETER INSERTION N/A 09/05/2017   Procedure: DIALYSIS/PERMA CATHETER INSERTION;  Surgeon: Algernon Huxley, MD;  Location: [facility] LAB;  Service: Cardiovascular;  Laterality: N/A;    Prior to Admission medications   Medication Sig Start Date End Date Taking? Authorizing Provider  carbidopa-levodopa (SINEMET IR) 25-100 MG tablet Take 1 tablet by mouth 4 (four) times daily. 05/11/18   [provider]  cefUROXime (CEFTIN) 500 MG tablet Take 1 tablet (500 mg total)  by mouth 2 (two) times daily with a meal. Patient not taking: Reported on 08/21/2018 06/02/18   Bettey Costa, MD  finasteride (PROSCAR) 5 MG tablet Take 1 tablet (5 mg total) by mouth daily. Patient not taking: Reported on 08/21/2018 09/16/17   Fritzi Mandes, MD  ibuprofen (ADVIL,MOTRIN) 400 MG tablet Take 1 tablet (400 mg total) by mouth every 8 (eight) hours as needed. Patient taking differently: Take 400 mg by mouth every 8 (eight) hours as needed for mild pain or moderate pain.  09/16/17   Fritzi Mandes, MD  levothyroxine (SYNTHROID, LEVOTHROID) 100 MCG tablet Take 100 mcg by mouth daily before breakfast.    [provider]  metoprolol succinate (TOPROL-XL) 25 MG 24 hr tablet Take 25 mg by mouth daily. 05/20/17   [provider]  simvastatin (ZOCOR) 20 MG tablet Take 1 tablet (20 mg total) by mouth daily. Patient taking differently: Take 20 mg by mouth at bedtime.  09/23/17   Fritzi Mandes, MD  tamsulosin (FLOMAX) 0.4 MG CAPS capsule Take 0.4 mg by mouth daily.    [provider]  warfarin (COUMADIN) 4 MG tablet Take 4 mg by mouth at bedtime.    [provider]  warfarin (COUMADIN) 6 MG tablet Take 1 tablet (6 mg total) by mouth daily. @@1700    Please do not take this on Friday, Saturday and Sunday 8/9-8/11. See PCP on Monday for INR check to restart Patient not taking: Reported on 08/21/2018 06/02/18   Bettey Costa, MD    Allergies Patient has  no known allergies.  Family History  Problem Relation Age of Onset  . Lung cancer Mother   . Lung cancer Father     Social History Social History   Tobacco Use  . Smoking status: Never Smoker  . Smokeless tobacco: Never Used  Substance Use Topics  . Alcohol use: No  . Drug use: No    Review of Systems  Constitutional: Positive for generalized weakness.  No fever/chills Eyes: No visual changes. ENT: No sore throat. Cardiovascular: Denies chest pain. Respiratory: Denies shortness of  breath. Gastrointestinal: No abdominal pain.  No nausea, no vomiting.  No diarrhea.  No constipation. Genitourinary: Negative for dysuria. Musculoskeletal: Negative for back pain. Skin: Negative for rash. Neurological: Negative for headaches, focal weakness or numbness.   ____________________________________________   PHYSICAL EXAM:  VITAL SIGNS: ED Triage Vitals  Enc Vitals Group     BP 10/11/18 0515 (!) 83/42     Pulse Rate 10/11/18 0515 75     Resp 10/11/18 0515 18     Temp 10/11/18 0515 (!) 97.5 F (36.4 C)     Temp Source 10/11/18 0515 Oral     SpO2 10/11/18 0515 97 %     Weight 10/11/18 0514 170 lb (77.1 kg)     Height 10/11/18 0514 6' (1.829 m)     Head Circumference --      Peak Flow --      Pain Score 10/11/18 0514 5     Pain Loc --      Pain Edu? --      Excl. in [place]? --     Constitutional: Alert and oriented. Well appearing and in no acute distress. Eyes: Conjunctivae are normal. PERRL. EOMI. Head: Atraumatic. Nose: No congestion/rhinnorhea. Mouth/Throat: Mucous membranes are moist.  Oropharynx non-erythematous. Neck: No stridor.  No cervical spine tenderness to palpation. Cardiovascular: Normal rate, regular rhythm. Grossly normal heart sounds.  Good peripheral circulation. Respiratory: Normal respiratory effort.  No retractions. Lungs CTAB. Gastrointestinal: Soft and nontender. No distention. No abdominal bruits. No CVA tenderness. Musculoskeletal: No spinal tenderness to palpation.  No lower extremity tenderness nor edema.  No joint effusions. Neurologic:  Normal speech and language. No gross focal neurologic deficits are appreciated.  Skin:  Skin is warm, dry and intact. No rash noted. Psychiatric: Mood and affect are normal. Speech and behavior are normal.  ____________________________________________   LABS (all labs ordered are listed, but only abnormal results are displayed)  Labs Reviewed  CBC WITH DIFFERENTIAL/PLATELET - Abnormal; Notable for  the following components:      Result Value   Monocytes Absolute 1.3 (*)    All other components within normal limits  COMPREHENSIVE METABOLIC PANEL  TROPONIN I  URINALYSIS, COMPLETE (UACMP) WITH MICROSCOPIC  PROTIME-INR   ____________________________________________  EKG  ED ECG REPORT I, SUNG,JADE J, the attending physician, personally viewed and interpreted this ECG.   Date: 10/11/2018  EKG Time: 0523  Rate: 97  Rhythm: atrial fibrillation, rate 97  Axis: Normal  Intervals:none  ST&T Change: Nonspecific  ____________________________________________  RADIOLOGY  ED MD interpretation: Pending  Official radiology report(s): No results found.  ____________________________________________   PROCEDURES  Procedure(s) performed: None  Procedures  Critical Care performed: No  ____________________________________________   INITIAL IMPRESSION / ASSESSMENT AND PLAN / ED COURSE  As part of my medical decision making, I reviewed the following data within the [facility] notes reviewed and incorporated, Labs reviewed, EKG interpreted, Old chart reviewed, Patient signed out to Dr.  Darl Householder and Notes from prior ED visits   67 year old male who presents with generalized weakness.  Differential diagnosis includes but is not limited to infectious, metabolic, neurologic, cardiac etiologies, etc.  Will obtain work-up to include troponin.  CT head, chest x-ray, urinalysis.  Discussed with patient and caretaker that if he does not meet hospitalization criteria, then we may consult clinical social work and physical therapy to evaluate patient for possible placement.  This time care is transferred to Dr. Darl Householder pending all test results and disposition.      ____________________________________________   FINAL CLINICAL IMPRESSION(S) / ED DIAGNOSES  Final diagnoses:  Generalized weakness     ED Discharge Orders    None       Note:  This document was  prepared using Dragon voice recognition software and may include unintentional dictation errors.    Paulette Blanch, MD 10/11/18 [phone]

## 2018-10-11 NOTE — Progress Notes (Signed)
[place] for warfarin Indication: atrial fibrillation  No Known Allergies  Patient Measurements: Height: 6' (182.9 cm) Weight: 170 lb (77.1 kg) IBW/kg (Calculated) : 77.6  Vital Signs: Temp: 97.5 F (36.4 C) (12/18 0515) Temp Source: Oral (12/18 0515) BP: 89/66 (12/18 0900) Pulse Rate: 86 (12/18 0900)  Labs: Recent Labs    10/11/18 0537 10/11/18 0738  HGB 14.1  --   HCT 43.0  --   PLT 229  --   LABPROT  --  32.8*  INR  --  3.27  CREATININE 1.44*  --   TROPONINI 0.03*  --     Estimated Creatinine Clearance: 55 mL/min (A) (by C-G formula based on SCr of 1.44 mg/dL (H)).   Medical History: Past Medical History:  Diagnosis Date  . Atrial fibrillation ([place])   . BPH (benign prostatic hyperplasia)   . Cardiomyopathy ([place])   . Coronary artery disease   . Hypertension   . Hypothyroidism   . Parkinson's disease [facility])    Assessment: 67 year old male with h/o atrial fibrillation on warfarin PTA. Per patient, he takes 5 mg daily. He took his dose 12/17 pm. Patient presented 12/18 for a fall. Initial concern for possible left frontal lobe bleed on CT, but this was subsequently ruled out by CTA. Patient has been cleared by Neurosurgery per ED MD. Pharmacy has been consulted for warfarin dosing.   Date INR Dose 12/18 3.27  Goal of Therapy:  INR 2-3 Monitor platelets by anticoagulation protocol: Yes   Plan:  Patient has been cleared of possible head bleed. With slightly supratherapeutic INR, will give a reduced dose of warfarin tonight. Warfarin 4 mg to be given tonight at 1800. INR with morning labs.  Tawnya Crook, PharmD Pharmacy Resident  10/11/2018 9:57 AM

## 2018-10-11 NOTE — Progress Notes (Signed)
Pt with no BM for 3 days, MD paged, Dr. Posey Pronto gave orders for colace 100mg  BID. Will give & continue to monitor. Conley Simmonds, RN, BSN

## 2018-10-11 NOTE — Progress Notes (Signed)
Patient ID: Douglas Silva., male   DOB: December 09, 1950, 67 y.o.   MRN: [id number]  ACP note  Patient present  Diagnosis: Hypotension, Parkinson's disease, fall and possible syncope, atrial fibrillation which is chronic, BPH, dehydration and acute kidney injury.  CODE STATUS discussed and patient wishes to be a full code.  Plan.  IV fluid hydration.  Check orthostatic vital signs.  Continue to monitor on telemetry.  Check labs tomorrow.  Physical therapy evaluation.  Time spent on ACP discussion 17 minutes Dr. Loletha Grayer

## 2018-10-11 NOTE — Progress Notes (Signed)
Patient orthostatic on admission and symptomatic.  Ensure bed alarm stays on, call bell within reach.

## 2018-10-11 NOTE — ED Triage Notes (Signed)
Pt in with co weakness since yesterday co pain to buttocks, brought in by caretaker.

## 2018-10-12 DIAGNOSIS — R531 Weakness: Secondary | ICD-10-CM | POA: Diagnosis present

## 2018-10-12 DIAGNOSIS — R269 Unspecified abnormalities of gait and mobility: Secondary | ICD-10-CM | POA: Diagnosis present

## 2018-10-12 DIAGNOSIS — R7989 Other specified abnormal findings of blood chemistry: Secondary | ICD-10-CM | POA: Diagnosis present

## 2018-10-12 DIAGNOSIS — Z7901 Long term (current) use of anticoagulants: Secondary | ICD-10-CM | POA: Diagnosis not present

## 2018-10-12 DIAGNOSIS — Z79899 Other long term (current) drug therapy: Secondary | ICD-10-CM | POA: Diagnosis not present

## 2018-10-12 DIAGNOSIS — I951 Orthostatic hypotension: Secondary | ICD-10-CM | POA: Diagnosis present

## 2018-10-12 DIAGNOSIS — G20 Parkinson's disease: Secondary | ICD-10-CM | POA: Diagnosis present

## 2018-10-12 DIAGNOSIS — E039 Hypothyroidism, unspecified: Secondary | ICD-10-CM | POA: Diagnosis present

## 2018-10-12 DIAGNOSIS — I482 Chronic atrial fibrillation, unspecified: Secondary | ICD-10-CM | POA: Diagnosis present

## 2018-10-12 DIAGNOSIS — I2510 Atherosclerotic heart disease of native coronary artery without angina pectoris: Secondary | ICD-10-CM | POA: Diagnosis present

## 2018-10-12 DIAGNOSIS — Z9861 Coronary angioplasty status: Secondary | ICD-10-CM | POA: Diagnosis not present

## 2018-10-12 DIAGNOSIS — Y92009 Unspecified place in unspecified non-institutional (private) residence as the place of occurrence of the external cause: Secondary | ICD-10-CM | POA: Diagnosis not present

## 2018-10-12 DIAGNOSIS — W19XXXA Unspecified fall, initial encounter: Secondary | ICD-10-CM | POA: Diagnosis present

## 2018-10-12 DIAGNOSIS — N400 Enlarged prostate without lower urinary tract symptoms: Secondary | ICD-10-CM | POA: Diagnosis present

## 2018-10-12 DIAGNOSIS — E860 Dehydration: Secondary | ICD-10-CM | POA: Diagnosis present

## 2018-10-12 DIAGNOSIS — N179 Acute kidney failure, unspecified: Secondary | ICD-10-CM | POA: Diagnosis present

## 2018-10-12 DIAGNOSIS — I429 Cardiomyopathy, unspecified: Secondary | ICD-10-CM | POA: Diagnosis present

## 2018-10-12 DIAGNOSIS — N183 Chronic kidney disease, stage 3 (moderate): Secondary | ICD-10-CM | POA: Diagnosis present

## 2018-10-12 DIAGNOSIS — I129 Hypertensive chronic kidney disease with stage 1 through stage 4 chronic kidney disease, or unspecified chronic kidney disease: Secondary | ICD-10-CM | POA: Diagnosis present

## 2018-10-12 LAB — CBC
HCT: 39.6 % (ref 39.0–52.0)
HEMOGLOBIN: 12.5 g/dL — AB (ref 13.0–17.0)
MCH: 30.6 pg (ref 26.0–34.0)
MCHC: 31.6 g/dL (ref 30.0–36.0)
MCV: 97.1 fL (ref 80.0–100.0)
Platelets: 210 10*3/uL (ref 150–400)
RBC: 4.08 MIL/uL — ABNORMAL LOW (ref 4.22–5.81)
RDW: 13.3 % (ref 11.5–15.5)
WBC: 9.6 10*3/uL (ref 4.0–10.5)
nRBC: 0 % (ref 0.0–0.2)

## 2018-10-12 LAB — BASIC METABOLIC PANEL
Anion gap: 5 (ref 5–15)
BUN: 31 mg/dL — ABNORMAL HIGH (ref 8–23)
CO2: 26 mmol/L (ref 22–32)
Calcium: 8.7 mg/dL — ABNORMAL LOW (ref 8.9–10.3)
Chloride: 110 mmol/L (ref 98–111)
Creatinine, Ser: 1.08 mg/dL (ref 0.61–1.24)
GFR calc Af Amer: >60 mL/min (ref >60)
GLUCOSE: 113 mg/dL — AB (ref 70–99)
Potassium: 3.8 mmol/L (ref 3.5–5.1)
Sodium: 141 mmol/L (ref 135–145)

## 2018-10-12 LAB — PROTIME-INR
INR: 3.2
Prothrombin Time: 32.3 seconds — ABNORMAL HIGH (ref 11.4–15.2)

## 2018-10-12 MED ORDER — WARFARIN SODIUM 3 MG PO TABS
3.0000 mg | ORAL_TABLET | Freq: Once | ORAL | Status: AC
  Administered 2018-10-12: 3 mg via ORAL
  Filled 2018-10-12: qty 1

## 2018-10-12 NOTE — Progress Notes (Signed)
[place] for warfarin Indication: atrial fibrillation  No Known Allergies  Patient Measurements: Height: 6' (182.9 cm) Weight: 171 lb 11.2 oz (77.9 kg) IBW/kg (Calculated) : 77.6  Vital Signs: Temp: 97.7 F (36.5 C) (12/19 0818) Temp Source: Oral (12/19 0330) BP: 126/76 (12/19 0818) Pulse Rate: 52 (12/19 0818)  Labs: Recent Labs    10/11/18 0537 10/11/18 0738 10/11/18 1131 10/11/18 1527 10/12/18 0337  HGB 14.1  --   --   --  12.5*  HCT 43.0  --   --   --  39.6  PLT 229  --   --   --  210  LABPROT  --  32.8*  --   --  32.3*  INR  --  3.27  --   --  3.20  CREATININE 1.44*  --   --   --  1.08  TROPONINI 0.03*  --  <0.03 <0.03  --     Estimated Creatinine Clearance: 73.8 mL/min (by C-G formula based on SCr of 1.08 mg/dL).   Medical History: Past Medical History:  Diagnosis Date  . Atrial fibrillation ([address])   . BPH (benign prostatic hyperplasia)   . Cardiomyopathy ([place])   . Coronary artery disease   . Hypertension   . Hypothyroidism   . Parkinson's disease [facility])    Assessment: 67 year old male with h/o atrial fibrillation on warfarin PTA. Per patient, he takes 5 mg daily. He took his dose 12/17 pm. Patient presented 12/18 for a fall. Initial concern for possible left frontal lobe bleed on CT, but this was subsequently ruled out by CTA. Patient has been cleared by Neurosurgery per ED MD. Pharmacy has been consulted for warfarin dosing.   Date INR Dose 12/18 3.27 4 mg 12/19 3.20  Goal of Therapy:  INR 2-3 Monitor platelets by anticoagulation protocol: Yes   Plan:  Patient has been cleared of possible head bleed. With slightly supratherapeutic INR, will give a reduced dose of warfarin tonight. Hgb 12.5 plt 210 Warfarin 3 mg to be given tonight at 1800. INR with morning labs.  Chinita Greenland PharmD Clinical Pharmacist 10/12/2018

## 2018-10-12 NOTE — Care Management Obs Status (Signed)
[facility] NOTIFICATION   Patient Details  Name: Douglas Silva. MRN: [id number] Date of Birth: May 16, 1951   Medicare Observation Status Notification Given:  Yes    Elza Rafter, RN 10/12/2018, 9:22 AM

## 2018-10-12 NOTE — Evaluation (Signed)
Physical Therapy Evaluation Patient Details Name: Douglas Silva. MRN: [id number] DOB: 11-07-1950 Today's Date: 10/12/2018   History of Present Illness  Pt is 67 yo male admitted for hypotension and s/p fall, dehydration. PMH of Parkinson's disease, afib, HTN, CAD.  Clinical Impression  Patient A&Ox4 at start of session, no complaints of pain. Orthostatic vitals assessed, see vitals flowsheet, pt asymptomatic during mobility. Pt reported living in 1 story home with 24/7 care from [place] [facility], at baseline able to ambulate with [phone] with varying levels of physical assistance needed for household distances, intermittent assistance for ADLs, dependent for IADLs.   Pt demonstrated bed mobility modAx1, able to sit EOB with poor-fair balance, improved with verbal cues and feet supported. Sit <> stand with CGAx2 and heavy use of RW. Pt able to stand with CGA-minAx1 and RW for at least 1.5 minutes for BP to be assessed, pt quickly fatigued. Able to take shuffling steps with RW and minAx1, CGAx1, and physical assist for AD management. The patient demonstrated limitations (see "PT Problem List") that impede pt's ability to perform functional activities and differ from PLOF. The patient would benefit from further skilled PT to return patient to PLOF and to maximize mobility, safety, and independence. Recommendation is STR due to current level of assistance needed for mobility, inaccessible home environment, and safety concerns.    Follow Up Recommendations SNF    Equipment Recommendations  Other (comment);None recommended by PT(Pt reported having 4WW and RW at home)    Recommendations for Other Services       Precautions / Restrictions Precautions Precautions: Fall Restrictions Weight Bearing Restrictions: No      Mobility  Bed Mobility Overal bed mobility: Needs Assistance Bed Mobility: Supine to Sit     Supine to sit: Mod assist        Transfers Overall transfer level:  Needs assistance Equipment used: Rolling walker (2 wheeled) Transfers: Sit to/from Stand Sit to Stand: Min guard            Ambulation/Gait Ambulation/Gait assistance: Min assist;+2 physical assistance;+2 safety/equipment Gait Distance (Feet): 3 Feet Assistive device: Rolling walker (2 wheeled) Gait Pattern/deviations: Shuffle Gait velocity: decreased   General Gait Details: Pt most challenged with foot clearance for LLE. Needed physical assistance for AD management as well. minAx1, and CGA.  Stairs            Wheelchair Mobility    Modified Rankin (Stroke Patients Only)       Balance Overall balance assessment: Needs assistance Sitting-balance support: Feet supported Sitting balance-Leahy Scale: Fair Sitting balance - Comments: Pt intermittently needed CGA to maintain seated balance, improved with feet supported and verbal cues Postural control: Posterior lean   Standing balance-Leahy Scale: Poor                               Pertinent Vitals/Pain Pain Assessment: No/denies pain    Home Living Family/patient expects to be discharged to:: Private residence Living Arrangements: Alone;Other (Comment)(x3 people to provide 24/7 supervision) Available Help at Discharge: Personal care attendant Type of Home: House Home Access: Stairs to enter   Entrance Stairs-Number of Steps: 2+1 no railing Home Layout: One level Home Equipment: Grab bars - toilet;Grab bars - tub/shower;Shower seat;Walker - 4 wheels;Walker - 2 wheels      Prior Function Level of Independence: Needs assistance   Gait / Transfers Assistance Needed: Pt ambulates with varying degrees of physical assistance  and rollator, phsical assist needed for stair navigation as well  ADL's / Homemaking Assistance Needed: needs assistance with IADLs, varying degrees of ADL assistance needed, depending on the day  Comments: Pt denies at least a few recent falls     Hand Dominance         Extremity/Trunk Assessment   Upper Extremity Assessment Upper Extremity Assessment: Overall WFL for tasks assessed    Lower Extremity Assessment Lower Extremity Assessment: Generalized weakness    Cervical / Trunk Assessment Cervical / Trunk Assessment: Other exceptions Cervical / Trunk Exceptions: Pt with resting tremors throughout session  Communication   Communication: No difficulties  Cognition Arousal/Alertness: Awake/alert Behavior During Therapy: WFL for tasks assessed/performed Overall Cognitive Status: Within Functional Limits for tasks assessed                                        General Comments      Exercises Other Exercises Other Exercises: Pt able to sit EOB for several minutes from CGA to supervision with verbal cues to attend to sitting balance. Other Exercises: Pt able to stand 1-1.5 minutes to assess BP with CGAx2 and RW. Became fatigued   Assessment/Plan    PT Assessment Patient needs continued PT services  PT Problem List Decreased strength;Decreased activity tolerance;Decreased balance;Decreased safety awareness;Decreased mobility;Decreased knowledge of precautions       PT Treatment Interventions DME instruction;Therapeutic exercise;Gait training;Balance training;Stair training;Neuromuscular re-education;Functional mobility training;Therapeutic activities;Patient/family education    PT Goals (Current goals can be found in the Care Plan section)  Acute Rehab PT Goals Patient Stated Goal: To go home PT Goal Formulation: With patient Time For Goal Achievement: 10/26/18 Potential to Achieve Goals: Good    Frequency Min 2X/week   Barriers to discharge Inaccessible home environment      Co-evaluation               AM-PAC PT "6 Clicks" Mobility  Outcome Measure Help needed turning from your back to your side while in a flat bed without using bedrails?: A Lot Help needed moving from lying on your back to sitting on the  side of a flat bed without using bedrails?: A Lot Help needed moving to and from a bed to a chair (including a wheelchair)?: A Lot Help needed standing up from a chair using your arms (e.g., wheelchair or bedside chair)?: A Little Help needed to walk in hospital room?: A Lot Help needed climbing 3-5 steps with a railing? : Total 6 Click Score: 12    End of Session Equipment Utilized During Treatment: Gait belt Activity Tolerance: Patient limited by fatigue Patient left: with chair alarm set;in chair;with SCD's reapplied;with family/visitor present;with call bell/phone within reach Nurse Communication: Mobility status;Other (comment)(orthostatic vitals) PT Visit Diagnosis: Unsteadiness on feet (R26.81);Difficulty in walking, not elsewhere classified (R26.2);Muscle weakness (generalized) (M62.81);History of falling (Z91.81);Other abnormalities of gait and mobility (R26.89)    Time: 6213-0865 PT Time Calculation (min) (ACUTE ONLY): 40 min   Charges:   PT Evaluation $PT Eval Moderate Complexity: 1 Mod PT Treatments $Therapeutic Activity: 23-37 mins       Lieutenant Diego PT, DPT 1:50 PM,10/12/18 [phone]

## 2018-10-12 NOTE — Plan of Care (Signed)
  Problem: Activity: Goal: Risk for activity intolerance will decrease Outcome: Progressing Note:  Up to chair and back to bed with 2 assist, using urinal   Problem: Nutrition: Goal: Adequate nutrition will be maintained Outcome: Progressing   Problem: Coping: Goal: Level of anxiety will decrease Outcome: Progressing   Problem: Elimination: Goal: Will not experience complications related to urinary retention Outcome: Progressing   Problem: Pain Managment: Goal: General experience of comfort will improve Outcome: Progressing Note:  No complaints of pain this shift   Problem: Safety: Goal: Ability to remain free from injury will improve Outcome: Progressing Note:  Pt placed on low bed   Problem: Education: Goal: Knowledge of General Education information will improve Description Including pain rating scale, medication(s)/side effects and non-pharmacologic comfort measures Outcome: Completed/Met

## 2018-10-12 NOTE — Progress Notes (Signed)
[name] NAME: Douglas Silva    MR#:  [id number]  DATE OF BIRTH:  Mar 06, 1951  SUBJECTIVE:  CHIEF COMPLAINT:   Chief Complaint  Patient presents with  . Weakness  Patient seen and evaluated today Had fallen blood pressure when he stands up today with the help of physical therapy Needed assistance to transfer to the recliner Able to ambulate with the help of a walker but needs a lot of assistance  REVIEW OF SYSTEMS:    ROS  CONSTITUTIONAL: No documented fever. No fatigue, weakness. No weight gain, no weight loss.  EYES: No blurry or double vision.  ENT: No tinnitus. No postnasal drip. No redness of the oropharynx.  RESPIRATORY: No cough, no wheeze, no hemoptysis. No dyspnea.  CARDIOVASCULAR: No chest pain. No orthopnea. No palpitations. No syncope.  GASTROINTESTINAL: No nausea, no vomiting or diarrhea. No abdominal pain. No melena or hematochezia.  GENITOURINARY: No dysuria or hematuria.  ENDOCRINE: No polyuria or nocturia. No heat or cold intolerance.  HEMATOLOGY: No anemia. No bruising. No bleeding.  INTEGUMENTARY: No rashes. No lesions.  MUSCULOSKELETAL: No arthritis. No swelling. No gout.  Has tremor in the upper extremities from Parkinson's disease NEUROLOGIC: No numbness, tingling, or ataxia. No seizure-type activity.  PSYCHIATRIC: No anxiety. No insomnia. No ADD.   DRUG ALLERGIES:  No Known Allergies  VITALS:  Blood pressure 126/76, pulse (!) 52, temperature 97.7 F (36.5 C), resp. rate 18, height 6' (1.829 m), weight 77.9 kg, SpO2 96 %.  PHYSICAL EXAMINATION:   Physical Exam  GENERAL:  67 y.o.-year-old patient lying in the bed with no acute distress.  EYES: Pupils equal, round, reactive to light and accommodation. No scleral icterus. Extraocular muscles intact.  HEENT: Head atraumatic, normocephalic. Oropharynx and nasopharynx clear.  NECK:  Supple, no jugular venous distention. No thyroid enlargement, no  tenderness.  LUNGS: Normal breath sounds bilaterally, no wheezing, rales, rhonchi. No use of accessory muscles of respiration.  CARDIOVASCULAR: S1, S2 normal. No murmurs, rubs, or gallops.  ABDOMEN: Soft, nontender, nondistended. Bowel sounds present. No organomegaly or mass.  EXTREMITIES: No cyanosis, clubbing or edema b/l.   Has tremor in the upper extremities NEUROLOGIC: Cranial nerves II through XII are intact. No focal Motor or sensory deficits b/l.   PSYCHIATRIC: The patient is alert and oriented x 3.  SKIN: No obvious rash, lesion, or ulcer.   LABORATORY PANEL:   CBC Recent Labs  Lab 10/12/18 0337  WBC 9.6  HGB 12.5*  HCT 39.6  PLT 210   ------------------------------------------------------------------------------------------------------------------ Chemistries  Recent Labs  Lab 10/11/18 0537 10/12/18 0337  NA 139 141  K 3.7 3.8  CL 105 110  CO2 26 26  GLUCOSE 102* 113*  BUN 36* 31*  CREATININE 1.44* 1.08  CALCIUM 9.3 8.7*  AST 164*  --   ALT 40  --   ALKPHOS 51  --   BILITOT 1.4*  --    ------------------------------------------------------------------------------------------------------------------  Cardiac Enzymes Recent Labs  Lab 10/11/18 1527  TROPONINI <0.03   ------------------------------------------------------------------------------------------------------------------  RADIOLOGY:  Ct Angio Head W Or Wo Contrast  Result Date: 10/11/2018 CLINICAL DATA:  Fall.  Intracranial hemorrhage on CT EXAM: CT ANGIOGRAPHY HEAD AND NECK TECHNIQUE: Multidetector CT imaging of the head and neck was performed using the standard protocol during bolus administration of intravenous contrast. Multiplanar CT image reconstructions and MIPs were obtained to evaluate the vascular anatomy. Carotid stenosis measurements (when applicable) are obtained utilizing NASCET criteria, using the distal  internal carotid diameter as the denominator. CONTRAST:  35mL ISOVUE-370  IOPAMIDOL (ISOVUE-370) INJECTION 76% COMPARISON:  CT head 10/11/2018 FINDINGS: CTA NECK FINDINGS Aortic arch: Standard branching. Imaged portion shows no evidence of aneurysm or dissection. No significant stenosis of the major arch vessel origins. Right carotid system: Normal right carotid. Negative for stenosis or atherosclerotic disease Left carotid system: Normal left carotid. Negative for stenosis or atherosclerotic disease Vertebral arteries: Both vertebral arteries are normal and patent to the basilar. Skeleton: Cervical kyphosis likely positional. No acute skeletal abnormality. Other neck: Negative for mass or adenopathy Upper chest: Lung apices clear bilaterally. Review of the MIP images confirms the above findings CTA HEAD FINDINGS Anterior circulation: Cavernous carotid widely patent. Negative for aneurysm or stenosis. No significant atherosclerotic disease. Anterior and middle cerebral arteries are widely patent bilaterally. Negative for intracranial hemorrhage. The hyperdensity in the right lower frontal lobe on the earlier CT is consistent with streak artifact from the adjacent bony structures. Posterior circulation: Both vertebral arteries widely patent to the basilar. Basilar widely patent. PICA, superior cerebellar, and posterior cerebral arteries are normal bilaterally. No stenosis or aneurysm. Venous sinuses: Patent Anatomic variants: None Delayed phase: Normal enhancement on delayed hemorrhage imaging. Negative for hemorrhage mass or infarction. Review of the MIP images confirms the above findings IMPRESSION: 1. Negative for intracranial hemorrhage. Review of the recent head CT demonstrates streak artifact in the right frontal lobe but no hemorrhage 2. Negative CTA head neck. Negative for intracranial or extracranial stenosis. Negative for vascular malformation. Electronically Signed   By: Franchot Gallo M.D.   On: 10/11/2018 08:33   Dg Chest 1 View  Result Date: 10/11/2018 CLINICAL DATA:   Altered mental status.  Hypertension. EXAM: CHEST  1 VIEW COMPARISON:  August 21, 2018 FINDINGS: There is mild atelectatic change and scarring in the left base. There is no edema or consolidation. Heart is borderline enlarged with pulmonary vascularity normal. No adenopathy. There is a probable hiatal hernia. IMPRESSION: Slight scarring and atelectasis left base. No edema or consolidation. Stable cardiac prominence. Apparent hiatal hernia. Electronically Signed   By: Lowella Grip III M.D.   On: 10/11/2018 07:23   Ct Head Wo Contrast  Result Date: 10/11/2018 CLINICAL DATA:  Altered mental status.  Parkinson's disease EXAM: CT HEAD WITHOUT CONTRAST TECHNIQUE: Contiguous axial images were obtained from the base of the skull through the vertex without intravenous contrast. COMPARISON:  None. FINDINGS: Brain: There is age related volume loss. There is a focal area of hemorrhage in the anterior inferior right frontal lobe, best appreciated on coronal and sagittal images. No other hemorrhage is evident. There is no mass, extra-axial fluid collection, or midline shift. There is mild small vessel disease in the centra semiovale bilaterally. Brain parenchyma elsewhere appears unremarkable. Vascular: No hyperdense vessel. There is no appreciable vascular calcification. Skull: Bony calvarium appears intact. Sinuses/Orbits: There is mucosal thickening in the right maxillary antrum. There is mucosal thickening in several ethmoid air cells. Orbits appear symmetric bilaterally. There is evidence of cataract removals bilaterally. Other: Mastoid air cells are clear. IMPRESSION: Focal hemorrhage inferior right frontal lobe region. Suspect amyloid angiopathy given this appearance. No other foci of hemorrhage evident. There is age related volume loss with mild periventricular small vessel disease. There is mucosal thickening in several paranasal sinus regions. Critical Value/emergent results were called by telephone at the  time of interpretation on 10/11/2018 at 7:21 am to Dr. Lurline Hare , who verbally acknowledged these results. Electronically Signed   By: Gwyndolyn Saxon  Jasmine December III M.D.   On: 10/11/2018 07:21   Ct Angio Neck W And/or Wo Contrast  Result Date: 10/11/2018 CLINICAL DATA:  Fall.  Intracranial hemorrhage on CT EXAM: CT ANGIOGRAPHY HEAD AND NECK TECHNIQUE: Multidetector CT imaging of the head and neck was performed using the standard protocol during bolus administration of intravenous contrast. Multiplanar CT image reconstructions and MIPs were obtained to evaluate the vascular anatomy. Carotid stenosis measurements (when applicable) are obtained utilizing NASCET criteria, using the distal internal carotid diameter as the denominator. CONTRAST:  33mL ISOVUE-370 IOPAMIDOL (ISOVUE-370) INJECTION 76% COMPARISON:  CT head 10/11/2018 FINDINGS: CTA NECK FINDINGS Aortic arch: Standard branching. Imaged portion shows no evidence of aneurysm or dissection. No significant stenosis of the major arch vessel origins. Right carotid system: Normal right carotid. Negative for stenosis or atherosclerotic disease Left carotid system: Normal left carotid. Negative for stenosis or atherosclerotic disease Vertebral arteries: Both vertebral arteries are normal and patent to the basilar. Skeleton: Cervical kyphosis likely positional. No acute skeletal abnormality. Other neck: Negative for mass or adenopathy Upper chest: Lung apices clear bilaterally. Review of the MIP images confirms the above findings CTA HEAD FINDINGS Anterior circulation: Cavernous carotid widely patent. Negative for aneurysm or stenosis. No significant atherosclerotic disease. Anterior and middle cerebral arteries are widely patent bilaterally. Negative for intracranial hemorrhage. The hyperdensity in the right lower frontal lobe on the earlier CT is consistent with streak artifact from the adjacent bony structures. Posterior circulation: Both vertebral arteries widely  patent to the basilar. Basilar widely patent. PICA, superior cerebellar, and posterior cerebral arteries are normal bilaterally. No stenosis or aneurysm. Venous sinuses: Patent Anatomic variants: None Delayed phase: Normal enhancement on delayed hemorrhage imaging. Negative for hemorrhage mass or infarction. Review of the MIP images confirms the above findings IMPRESSION: 1. Negative for intracranial hemorrhage. Review of the recent head CT demonstrates streak artifact in the right frontal lobe but no hemorrhage 2. Negative CTA head neck. Negative for intracranial or extracranial stenosis. Negative for vascular malformation. Electronically Signed   By: Franchot Gallo M.D.   On: 10/11/2018 08:33     ASSESSMENT AND PLAN:   67 year old male patient with history of Parkinson's disease, atrial fibrillation, cardiomyopathy, benign prostate hypertrophy, coronary disease, hypertension currently under hospitalist service for hypotension  -Orthostatic hypotension IV fluids Hold Flomax Monitor blood pressure closely  -Ambulatory dysfunction Physical therapy evaluation and follow-up  -Parkinson's disease Continue oral Sinemet  -Near-syncope IV fluids Cardiac monitoring  -Chronic atrial fibrillation Continue anticoagulation with Coumadin Pharmacy to dose  -CT head, CT angiogram of the head and neck are all negative for any hemorrhage  -Acute on chronic kidney disease stage III IV fluid hydration monitor renal function  All the records are reviewed and case discussed with Care Management/Social Worker. Management plans discussed with the patient, family and they are in agreement.  CODE STATUS: Full code  DVT Prophylaxis: SCDs  TOTAL TIME TAKING CARE OF THIS PATIENT: 35 minutes.   POSSIBLE D/C IN 1 to 2 DAYS, DEPENDING ON CLINICAL CONDITION.  Saundra Shelling M.D on 10/12/2018 at 3:17 PM  Between 7am to 6pm - Pager - [phone]  After 6pm go to [email or web address] - password [email or web address]  [facility]  Office  [phone]  CC: Primary care physician; Remi Haggard, FNP  Note: This dictation was prepared with Dragon dictation along with smaller phrase technology. Any transcriptional errors that result from this process are unintentional.

## 2018-10-13 LAB — BASIC METABOLIC PANEL
Anion gap: 8 (ref 5–15)
BUN: 24 mg/dL — ABNORMAL HIGH (ref 8–23)
CO2: 24 mmol/L (ref 22–32)
Calcium: 8.8 mg/dL — ABNORMAL LOW (ref 8.9–10.3)
Chloride: 109 mmol/L (ref 98–111)
Creatinine, Ser: 0.89 mg/dL (ref 0.61–1.24)
GFR calc Af Amer: >60 mL/min (ref >60)
GFR calc non Af Amer: >60 mL/min (ref >60)
Glucose, Bld: 94 mg/dL (ref 70–99)
Potassium: 3.9 mmol/L (ref 3.5–5.1)
Sodium: 141 mmol/L (ref 135–145)

## 2018-10-13 LAB — PROTIME-INR
INR: 3.05
Prothrombin Time: 31.1 seconds — ABNORMAL HIGH (ref 11.4–15.2)

## 2018-10-13 MED ORDER — POLYETHYLENE GLYCOL 3350 17 G PO PACK
17.0000 g | PACK | Freq: Every day | ORAL | Status: DC
  Administered 2018-10-13 – 2018-10-17 (×5): 17 g via ORAL
  Filled 2018-10-13 (×5): qty 1

## 2018-10-13 MED ORDER — WARFARIN SODIUM 3 MG PO TABS
3.0000 mg | ORAL_TABLET | Freq: Once | ORAL | Status: AC
  Administered 2018-10-13: 3 mg via ORAL
  Filled 2018-10-13: qty 1

## 2018-10-13 MED ORDER — BISACODYL 5 MG PO TBEC
5.0000 mg | DELAYED_RELEASE_TABLET | Freq: Every day | ORAL | Status: DC | PRN
  Administered 2018-10-13 – 2018-10-14 (×2): 5 mg via ORAL
  Filled 2018-10-13 (×2): qty 1

## 2018-10-13 NOTE — Clinical Social Work Note (Signed)
Clinical Social Work Assessment  Patient Details  Name: Douglas Silva. MRN: [id number] Date of Birth: [birth year]  Date of referral:  10/13/18               Reason for consult:  Facility Placement                Permission sought to share information with:  Facility Sport and exercise psychologist, Family Supports Permission granted to share information::  Yes, Verbal Permission Granted  Name::     Kandace Parkins Sister   [phone] OR Earl Gala [phone]    Agency::  SNF admissions  Relationship::     Contact Information:     Housing/Transportation Living arrangements for the past 2 months:  [place] of Information:  Patient Patient Interpreter Needed:  None Criminal Activity/Legal Involvement Pertinent to Current Situation/Hospitalization:  No - Comment as needed Significant Relationships:  Other Family Members Lives with:  Self Do you feel safe going back to the place where you live?  Yes Need for family participation in patient care:  No (Coment)  Care giving concerns:  Patient is not sure if he wants to go back to SNF or return home with home health.   Social Worker assessment / plan: Patient is a 67 year old male who is alert and oriented x4.  Patient lives alone and has 24/7 care givers.  Patient was explained role of CSW and process for looking for SNF placement.  Patient stated he was recently at [facility], and does not want to return back.  CSW informed patient there are other facilities as well that can take his insurance.  Patient stated he is not sure if he wants to return back to SNF or just return home with home health.  Patient did ask CSW to begin bed search and he will think about.  CSW faxed patient's clinical information to SNFs and are awaiting bed offers.  Patient did not have any other questions or concerns.  Employment status:  Retired Nurse, adult PT Recommendations:  [place] / Referral to community resources:  [place]  Patient/Family's Response to care: Patient is not sure if he wants to go to SNF or just return home.  Patient said he will think about it and also talk to the MD about making a decision.  Patient/Family's Understanding of and Emotional Response to Diagnosis, Current Treatment, and Prognosis:  Patient is hopeful that he can get his strength up enough to just return back home.  Emotional Assessment Appearance:  Appears stated age Attitude/Demeanor/Rapport:    Affect (typically observed):  Appropriate, Stable Orientation:  Oriented to Self, Oriented to Place, Oriented to  Time, Oriented to Situation Alcohol / Substance use:  Not Applicable Psych involvement (Current and /or in the community):  No (Comment)  Discharge Needs  Concerns to be addressed:  Care Coordination, Lack of Support Readmission within the last 30 days:  No Current discharge risk:  Lives alone, Lack of support system Barriers to Discharge:  Ship broker, Continued Medical Work up   [facility], [place] 10/13/2018, 4:05 PM

## 2018-10-13 NOTE — NC FL2 (Signed)
[place] LEVEL OF CARE SCREENING TOOL     IDENTIFICATION  Patient Name: Douglas Silva. Birthdate: Aug 20, 1951 Sex: male Admission Date (Current Location): 10/11/2018  [place] and [place] Number:  Engineering geologist and Address:  [facility], [address] [place], [place] [address]      Provider Number: [id number]  Attending Physician Name and Address:  Saundra Shelling, MD  Relative Name and Phone Number:  Kandace Parkins Sister   [phone] or Earl Gala [phone]      Current Level of Care: Hospital Recommended Level of Care: [place] Prior Approval Number:    Date Approved/Denied:   PASRR Number: [id number]  Discharge Plan: SNF    Current Diagnoses: Patient Active Problem List   Diagnosis Date Noted  . Orthostatic hypotension 10/12/2018  . Hypotension 10/11/2018  . Sepsis ([place]) 06/01/2018  . AKI (acute kidney injury) ([place]) 09/03/2017  . Cellulitis 06/01/2017    Orientation RESPIRATION BLADDER Height & Weight     Self  Normal Continent Weight: 171 lb 11.2 oz (77.9 kg) Height:  6' (182.9 cm)  BEHAVIORAL SYMPTOMS/MOOD NEUROLOGICAL BOWEL NUTRITION STATUS      Continent Diet(Regular diet)  AMBULATORY STATUS COMMUNICATION OF NEEDS Skin   Limited Assist Verbally Normal                       Personal Care Assistance Level of Assistance  Bathing, Feeding, Dressing Bathing Assistance: Limited assistance Feeding assistance: Independent Dressing Assistance: Limited assistance     Functional Limitations Info  Sight, Hearing, Speech Sight Info: Adequate Hearing Info: Adequate Speech Info: Adequate    SPECIAL CARE FACTORS FREQUENCY  PT (By licensed PT), OT (By licensed OT)     PT Frequency: 5x a week OT Frequency: 5x a week            Contractures Contractures Info: Not present    Additional Factors Info  Code Status, Allergies Code Status Info: Full Code Allergies Info:  No Known Allergies            Current Medications (10/13/2018):  This is the current hospital active medication list Current Facility-Administered Medications  Medication Dose Route Frequency Provider Last Rate Last Dose  . 0.9 %  sodium chloride infusion   Intravenous Continuous Loletha Grayer, MD 75 mL/hr at 10/13/18 [phone]    . acetaminophen (TYLENOL) tablet 650 mg  650 mg Oral Q6H PRN Loletha Grayer, MD   650 mg at 10/13/18 3154   Or  . acetaminophen (TYLENOL) suppository 650 mg  650 mg Rectal Q6H PRN Loletha Grayer, MD      . bisacodyl (DULCOLAX) EC tablet 5 mg  5 mg Oral Daily PRN Saundra Shelling, MD   5 mg at 10/13/18 0955  . carbidopa-levodopa (SINEMET IR) 25-100 MG per tablet immediate release 1 tablet  1 tablet Oral QID Loletha Grayer, MD   1 tablet at 10/13/18 1425  . docusate sodium (COLACE) capsule 100 mg  100 mg Oral BID Sed[place] Muta, MD   100 mg at 10/13/18 0956  . finasteride (PROSCAR) tablet 5 mg  5 mg Oral Daily Loletha Grayer, MD   5 mg at 10/13/18 0956  . levothyroxine (SYNTHROID, LEVOTHROID) tablet 100 mcg  100 mcg Oral Q0600 Loletha Grayer, MD   100 mcg at 10/13/18 0086  . ondansetron (ZOFRAN) tablet 4 mg  4 mg Oral Q6H PRN Loletha Grayer, MD       Or  .  ondansetron (ZOFRAN) injection 4 mg  4 mg Intravenous Q6H PRN Wieting, Richard, MD      . polyethylene glycol (MIRALAX / GLYCOLAX) packet 17 g  17 g Oral Daily Saundra Shelling, MD   17 g at 10/13/18 0955  . simvastatin (ZOCOR) tablet 20 mg  20 mg Oral QHS Loletha Grayer, MD   20 mg at 10/12/18 2214  . warfarin (COUMADIN) tablet 3 mg  3 mg Oral ONCE-1800 Rocky Morel, RPH      . Warfarin - Pharmacist Dosing Inpatient   Does not apply q1800 Tawnya Crook, [facility]         Discharge Medications: Please see discharge summary for a list of discharge medications.  Relevant Imaging Results:  Relevant Lab Results:   Additional Information SS# [id number]  Anell Barr

## 2018-10-13 NOTE — Progress Notes (Addendum)
Douglas Silva for warfarin Indication: atrial fibrillation  No Known Allergies  Patient Measurements: Height: 6' (182.9 cm) Weight: 171 lb 11.2 oz (77.9 kg) IBW/kg (Calculated) : 77.6  Vital Signs: Temp: 97.7 F (36.5 C) (12/20 0742) Temp Source: Oral (12/20 0427) BP: 113/74 (12/20 0742) Pulse Rate: 74 (12/20 0742)  Labs: Recent Labs    10/11/18 0537 10/11/18 0738 10/11/18 1131 10/11/18 1527 10/12/18 0337 10/13/18 0301  HGB 14.1  --   --   --  12.5*  --   HCT 43.0  --   --   --  39.6  --   PLT 229  --   --   --  210  --   LABPROT  --  32.8*  --   --  32.3* 31.1*  INR  --  3.27  --   --  3.20 3.05  CREATININE 1.44*  --   --   --  1.08 0.89  TROPONINI 0.03*  --  <0.03 <0.03  --   --     Estimated Creatinine Clearance: 89.6 mL/min (by C-G formula based on SCr of 0.89 mg/dL).   Medical History: Past Medical History:  Diagnosis Date  . Atrial fibrillation ([place])   . BPH (benign prostatic hyperplasia)   . Cardiomyopathy ([place])   . Coronary artery disease   . Hypertension   . Hypothyroidism   . Parkinson's disease [facility])    Assessment: 67 year old male with h/o atrial fibrillation on warfarin PTA. Per patient, he takes 5 mg daily. He took his dose 12/17 pm. Patient presented 12/18 for a fall. Initial concern for possible left frontal lobe bleed on CT, but this was subsequently ruled out by CTA. Patient has been cleared by Neurosurgery per ED MD. Pharmacy has been consulted for warfarin dosing.   Date INR Dose 12/18 3.27 4 mg 12/19 3.20 3 mg 12/20  3.05   Goal of Therapy:  INR 2-3 Monitor platelets by anticoagulation protocol: Yes   Plan:  Patient has been cleared of possible head bleed. With INR still slightly supratherapeutic, will give a reduced dose of warfarin tonight. Warfarin 3 mg PO x1 repeat to be given tonight at 1800. INR with morning labs. F/u Hgb trend as well in AM; no bleeding noted per hospitalist.   Rayna Sexton,  PharmD, BCPS Clinical Pharmacist 10/13/2018 12:57 PM

## 2018-10-13 NOTE — Plan of Care (Signed)

## 2018-10-13 NOTE — Progress Notes (Signed)
[place] at [place] NAME: Douglas Silva    MR#:  [id number]  DATE OF BIRTH:  04/19/1951  SUBJECTIVE:  CHIEF COMPLAINT:   Chief Complaint  Patient presents with  . Weakness  Patient seen and evaluated today Had one episode of low blood pressure this morning We will recheck orthostatics today Needs a walker to ambulate Has tremors secondary to Parkinson's disease  REVIEW OF SYSTEMS:    ROS  CONSTITUTIONAL: No documented fever. No fatigue, weakness. No weight gain, no weight loss.  EYES: No blurry or double vision.  ENT: No tinnitus. No postnasal drip. No redness of the oropharynx.  RESPIRATORY: No cough, no wheeze, no hemoptysis. No dyspnea.  CARDIOVASCULAR: No chest pain. No orthopnea. No palpitations. No syncope.  GASTROINTESTINAL: No nausea, no vomiting or diarrhea. No abdominal pain. No melena or hematochezia.  GENITOURINARY: No dysuria or hematuria.  ENDOCRINE: No polyuria or nocturia. No heat or cold intolerance.  HEMATOLOGY: No anemia. No bruising. No bleeding.  INTEGUMENTARY: No rashes. No lesions.  MUSCULOSKELETAL: No arthritis. No swelling. No gout.  Has tremor in the upper extremities from Parkinson's disease NEUROLOGIC: No numbness, tingling, or ataxia. No seizure-type activity.  PSYCHIATRIC: No anxiety. No insomnia. No ADD.   DRUG ALLERGIES:  No Known Allergies  VITALS:  Blood pressure 113/74, pulse 74, temperature 97.7 F (36.5 C), resp. rate 18, height 6' (1.829 m), weight 77.9 kg, SpO2 99 %.  PHYSICAL EXAMINATION:   Physical Exam  GENERAL:  67 y.o.-year-old patient lying in the bed with no acute distress.  EYES: Pupils equal, round, reactive to light and accommodation. No scleral icterus. Extraocular muscles intact.  HEENT: Head atraumatic, normocephalic. Oropharynx and nasopharynx clear.  NECK:  Supple, no jugular venous distention. No thyroid enlargement, no tenderness.  LUNGS: Normal breath sounds  bilaterally, no wheezing, rales, rhonchi. No use of accessory muscles of respiration.  CARDIOVASCULAR: S1, S2 normal. No murmurs, rubs, or gallops.  ABDOMEN: Soft, nontender, nondistended. Bowel sounds present. No organomegaly or mass.  EXTREMITIES: No cyanosis, clubbing or edema b/l.   Has tremor in the upper extremities NEUROLOGIC: Cranial nerves II through XII are intact. No focal Motor or sensory deficits b/l.   PSYCHIATRIC: The patient is alert and oriented x 3.  SKIN: No obvious rash, lesion, or ulcer.   LABORATORY PANEL:   CBC Recent Labs  Lab 10/12/18 0337  WBC 9.6  HGB 12.5*  HCT 39.6  PLT 210   ------------------------------------------------------------------------------------------------------------------ Chemistries  Recent Labs  Lab 10/11/18 0537  10/13/18 0301  NA 139   < > 141  K 3.7   < > 3.9  CL 105   < > 109  CO2 26   < > 24  GLUCOSE 102*   < > 94  BUN 36*   < > 24*  CREATININE 1.44*   < > 0.89  CALCIUM 9.3   < > 8.8*  AST 164*  --   --   ALT 40  --   --   ALKPHOS 51  --   --   BILITOT 1.4*  --   --    < > = values in this interval not displayed.   ------------------------------------------------------------------------------------------------------------------  Cardiac Enzymes Recent Labs  Lab 10/11/18 1527  TROPONINI <0.03   ------------------------------------------------------------------------------------------------------------------  RADIOLOGY:  No results found.   ASSESSMENT AND PLAN:   67 year old male patient with history of Parkinson's disease, atrial fibrillation, cardiomyopathy, benign prostate hypertrophy, coronary disease, hypertension currently under hospitalist  service for hypotension  -Severe orthostatic hypotension IV fluids to continue Hold Flomax Monitor blood pressure closely Recheck orthostatic vitals today  -Ambulatory dysfunction secondary to orthostatic hypotension and Parkinson's disease Physical therapy  evaluation and follow-up  -Parkinson's disease Continue oral Sinemet  -Near-syncope secondary to orthostatic hypotension IV fluids to continue Cardiac monitoring  -Chronic atrial fibrillation Continue anticoagulation with Coumadin, INR is therapeutic Pharmacy to dose  -CT head, CT angiogram of the head and neck are all negative for any hemorrhage  -Acute on chronic kidney disease stage III improved with IV fluids  -Disposition Once orthostatic hypotension is resolved, home with home health services versus rehab  All the records are reviewed and case discussed with Care Management/Social Worker. Management plans discussed with the patient, family and they are in agreement.  CODE STATUS: Full code  DVT Prophylaxis: SCDs  TOTAL TIME TAKING CARE OF THIS PATIENT: 34 minutes.   POSSIBLE D/C IN 1 to 2 DAYS, DEPENDING ON CLINICAL CONDITION.  Saundra Shelling M.D on 10/13/2018 at 11:37 AM  Between 7am to 6pm - Pager - [phone]  After 6pm go to [email or web address] - password [email or web address] [facility]  Office  [phone]  CC: Primary care physician; Remi Haggard, FNP  Note: This dictation was prepared with Dragon dictation along with smaller phrase technology. Any transcriptional errors that result from this process are unintentional.

## 2018-10-13 NOTE — Clinical Social Work Note (Signed)
CSW received consult for patient needing SNF placement.  CSW monitoring patient and will work on SNF placement.  Jones Broom. Ashiyah Pavlak, MSW, [place]  10/13/2018 9:48 AM

## 2018-10-14 LAB — PROTIME-INR
INR: 2.36
Prothrombin Time: 25.5 seconds — ABNORMAL HIGH (ref 11.4–15.2)

## 2018-10-14 LAB — HEMOGLOBIN AND HEMATOCRIT, BLOOD
HCT: 40.2 % (ref 39.0–52.0)
Hemoglobin: 12.7 g/dL — ABNORMAL LOW (ref 13.0–17.0)

## 2018-10-14 MED ORDER — WARFARIN SODIUM 3 MG PO TABS
3.0000 mg | ORAL_TABLET | Freq: Once | ORAL | Status: AC
  Administered 2018-10-14: 3 mg via ORAL
  Filled 2018-10-14: qty 1

## 2018-10-14 MED ORDER — SODIUM CHLORIDE 0.9% FLUSH
3.0000 mL | Freq: Two times a day (BID) | INTRAVENOUS | Status: DC
  Administered 2018-10-14 – 2018-10-17 (×5): 3 mL via INTRAVENOUS

## 2018-10-14 NOTE — Progress Notes (Signed)
[facility] for warfarin Indication: atrial fibrillation  No Known Allergies  Patient Measurements: Height: 6' (182.9 cm) Weight: 203 lb (92.1 kg) IBW/kg (Calculated) : 77.6  Vital Signs: Temp: 97.3 F (36.3 C) (12/21 0842) Temp Source: Oral (12/21 0842) BP: 108/85 (12/21 0842) Pulse Rate: 84 (12/21 0842)  Labs: Recent Labs    10/11/18 1131 10/11/18 1527 10/12/18 0337 10/13/18 0301 10/14/18 0432  HGB  --   --  12.5*  --  12.7*  HCT  --   --  39.6  --  40.2  PLT  --   --  210  --   --   LABPROT  --   --  32.3* 31.1* 25.5*  INR  --   --  3.20 3.05 2.36  CREATININE  --   --  1.08 0.89  --   TROPONINI <0.03 <0.03  --   --   --     Estimated Creatinine Clearance: 89.6 mL/min (by C-G formula based on SCr of 0.89 mg/dL).   Medical History: Past Medical History:  Diagnosis Date  . Atrial fibrillation ([place])   . BPH (benign prostatic hyperplasia)   . Cardiomyopathy ([place])   . Coronary artery disease   . Hypertension   . Hypothyroidism   . Parkinson's disease [facility])    Assessment: 67 year old male with h/o atrial fibrillation on warfarin PTA. Per patient, he takes 5 mg daily. He took his dose 12/17 pm. Patient presented 12/18 for a fall. Initial concern for possible left frontal lobe bleed on CT, but this was subsequently ruled out by CTA. Patient has been cleared by Neurosurgery per ED MD. Pharmacy has been consulted for warfarin dosing.   Date INR Dose 12/18 3.27 4 mg 12/19 3.20 3 mg 12/20  3.05 3 mg 12/21 2.36  Goal of Therapy:  INR 2-3 Monitor platelets by anticoagulation protocol: Yes   Plan:  Patient has been cleared of possible head bleed.  Warfarin 3 mg PO x1 again tonight at 1800. INR with morning labs.   Chinita Greenland PharmD Clinical Pharmacist 10/14/2018

## 2018-10-14 NOTE — Progress Notes (Signed)
[place] at [place] NAME: Douglas Silva    MR#:  [id number]  DATE OF BIRTH:  May 06, 1951  SUBJECTIVE:  CHIEF COMPLAINT:   Chief Complaint  Patient presents with  . Weakness  Patient seen and evaluated today Blood pressure is better.  Discontinue IV fluids and see how BP is today and plan for discharge disposition hopefully tomorrow. Has tremors secondary to Parkinson's disease  REVIEW OF SYSTEMS:    ROS  CONSTITUTIONAL: No documented fever. No fatigue, weakness. No weight gain, no weight loss.  EYES: No blurry or double vision.  ENT: No tinnitus. No postnasal drip. No redness of the oropharynx.  RESPIRATORY: No cough, no wheeze, no hemoptysis. No dyspnea.  CARDIOVASCULAR: No chest pain. No orthopnea. No palpitations. No syncope.  GASTROINTESTINAL: No nausea, no vomiting or diarrhea. No abdominal pain. No melena or hematochezia.  GENITOURINARY: No dysuria or hematuria.  ENDOCRINE: No polyuria or nocturia. No heat or cold intolerance.  HEMATOLOGY: No anemia. No bruising. No bleeding.  INTEGUMENTARY: No rashes. No lesions.  MUSCULOSKELETAL: No arthritis. No swelling. No gout.  Has tremor in the upper extremities from Parkinson's disease NEUROLOGIC: No numbness, tingling, or ataxia. No seizure-type activity.  PSYCHIATRIC: No anxiety. No insomnia. No ADD.   DRUG ALLERGIES:  No Known Allergies  VITALS:  Blood pressure 116/81, pulse (!) 122, temperature 97.6 F (36.4 C), temperature source Oral, resp. rate 19, height 6' (1.829 m), weight 92.1 kg, SpO2 99 %.  PHYSICAL EXAMINATION:   Physical Exam  GENERAL:  67 y.o.-year-old patient lying in the bed with no acute distress.  EYES: Pupils equal, round, reactive to light and accommodation. No scleral icterus. Extraocular muscles intact.  HEENT: Head atraumatic, normocephalic. Oropharynx and nasopharynx clear.  NECK:  Supple, no jugular venous distention. No thyroid enlargement, no  tenderness.  LUNGS: Normal breath sounds bilaterally, no wheezing, rales, rhonchi. No use of accessory muscles of respiration.  CARDIOVASCULAR: S1, S2 normal. No murmurs, rubs, or gallops.  ABDOMEN: Soft, nontender, nondistended. Bowel sounds present. No organomegaly or mass.  EXTREMITIES: No cyanosis, clubbing or edema b/l.   Has tremor in the upper extremities NEUROLOGIC: Cranial nerves II through XII are intact. No focal Motor or sensory deficits b/l.   PSYCHIATRIC: The patient is alert and oriented x 3.  SKIN: No obvious rash, lesion, or ulcer.   LABORATORY PANEL:   CBC Recent Labs  Lab 10/12/18 0337 10/14/18 0432  WBC 9.6  --   HGB 12.5* 12.7*  HCT 39.6 40.2  PLT 210  --    ------------------------------------------------------------------------------------------------------------------ Chemistries  Recent Labs  Lab 10/11/18 0537  10/13/18 0301  NA 139   < > 141  K 3.7   < > 3.9  CL 105   < > 109  CO2 26   < > 24  GLUCOSE 102*   < > 94  BUN 36*   < > 24*  CREATININE 1.44*   < > 0.89  CALCIUM 9.3   < > 8.8*  AST 164*  --   --   ALT 40  --   --   ALKPHOS 51  --   --   BILITOT 1.4*  --   --    < > = values in this interval not displayed.   ------------------------------------------------------------------------------------------------------------------  Cardiac Enzymes Recent Labs  Lab 10/11/18 1527  TROPONINI <0.03   ------------------------------------------------------------------------------------------------------------------  RADIOLOGY:  No results found.   ASSESSMENT AND PLAN:   67 year old male patient with  history of Parkinson's disease, atrial fibrillation, cardiomyopathy, benign prostate hypertrophy, coronary disease, hypertension currently under hospitalist service for hypotension  -Severe orthostatic hypotension Improved, received IV fluids, discontinue IV fluids today and see how the BP is today and possible discharge tomorrow.  Patient  wants to go home with home health but physical therapy is recommending SNF placement.  I will talk to him about that again tomorrow.  He says that he has 3 caregivers at home. Use Flomax at night.   -Ambulatory dysfunction secondary to orthostatic hypotension and Parkinson's disease Physical therapy recommends SNF placement.  -Parkinson's disease Continue oral Sinemet  -Near-syncope secondary to orthostatic hypotension, improved. I -Chronic atrial fibrillation Continue anticoagulation with Coumadin, INR is therapeutic Pharmacy to dose  -CT head, CT angiogram of the head and neck are all negative for any hemorrhage  -Acute on chronic kidney disease stage III improved with IV fluids  -Disposition Once orthostatic hypotension is resolved, home with home health services versus rehab  All the records are reviewed and case discussed with Care Management/Social Worker. Management plans discussed with the patient, family and they are in agreement.  CODE STATUS: Full code  DVT Prophylaxis: SCDs  TOTAL TIME TAKING CARE OF THIS PATIENT: 34 minutes.   POSSIBLE D/C IN 1 to 2 DAYS, DEPENDING ON CLINICAL CONDITION.  Epifanio Lesches M.D on 10/14/2018 at 6:19 PM  Between 7am to 6pm - Pager - [phone]  After 6pm go to [email or web address] - password [email or web address] [facility]  Office  [phone]  CC: Primary care physician; Remi Haggard, FNP  Note: This dictation was prepared with Dragon dictation along with smaller phrase technology. Any transcriptional errors that result from this process are unintentional.

## 2018-10-14 NOTE — Progress Notes (Signed)
Physical Therapy Treatment Patient Details Name: Douglas Silva. MRN: [id number] DOB: 02-24-1951 Today's Date: 10/14/2018    History of Present Illness Pt is 67 yo male admitted for hypotension and s/p fall, dehydration. PMH of Parkinson's disease, afib, HTN, CAD.    PT Comments    Pt with no c/o dizziness during session.  To edge of bed with mod a x 1.  Sitting with supervision for safety.  Stood and was able to take steps to recliner with min a +2 with significant post lean and feet sliding at times.  Limited by balance and fatigue.  Participated in exercises as described below.  SNF remains appropriate upon discharge.  Requires +2 assist for safety.   Follow Up Recommendations  SNF     Equipment Recommendations       Recommendations for Other Services       Precautions / Restrictions Precautions Precautions: Fall Restrictions Weight Bearing Restrictions: No    Mobility  Bed Mobility Overal bed mobility: Needs Assistance Bed Mobility: Supine to Sit     Supine to sit: Mod assist        Transfers   Equipment used: Rolling walker (2 wheeled) Transfers: Sit to/from Stand              Ambulation/Gait Ambulation/Gait assistance: Min assist;+2 physical assistance;+2 safety/equipment Gait Distance (Feet): 3 Feet Assistive device: Rolling walker (2 wheeled) Gait Pattern/deviations: Step-to pattern;Shuffle Gait velocity: decreased   General Gait Details: Very small shuffling steps, post lean requires constant +2 assist for safety   Stairs             Wheelchair Mobility    Modified Rankin (Stroke Patients Only)       Balance Overall balance assessment: Needs assistance Sitting-balance support: Feet supported Sitting balance-Leahy Scale: Fair   Postural control: Posterior lean Standing balance support: Bilateral upper extremity supported Standing balance-Leahy Scale: Poor Standing balance comment: significant post lean requires assist  for upright                             Cognition Arousal/Alertness: Awake/alert Behavior During Therapy: WFL for tasks assessed/performed Overall Cognitive Status: Within Functional Limits for tasks assessed                                        Exercises Other Exercises Other Exercises: seated AROM LAQ, marches, ab/add and ankle pumps BLE x 10    General Comments        Pertinent Vitals/Pain Pain Assessment: No/denies pain    Home Living                      Prior Function            PT Goals (current goals can now be found in the care plan section) Progress towards PT goals: Progressing toward goals    Frequency    Min 2X/week      PT Plan Current plan remains appropriate    Co-evaluation              AM-PAC PT "6 Clicks" Mobility   Outcome Measure  Help needed turning from your back to your side while in a flat bed without using bedrails?: A Lot Help needed moving from lying on your back to sitting on the side of a flat bed without  using bedrails?: A Lot Help needed moving to and from a bed to a chair (including a wheelchair)?: A Lot Help needed standing up from a chair using your arms (e.g., wheelchair or bedside chair)?: A Little Help needed to walk in hospital room?: A Lot Help needed climbing 3-5 steps with a railing? : Total 6 Click Score: 12    End of Session Equipment Utilized During Treatment: Gait belt Activity Tolerance: Patient limited by fatigue Patient left: with chair alarm set;in chair;with call bell/phone within reach Nurse Communication: Mobility status       Time: 5396-7289 PT Time Calculation (min) (ACUTE ONLY): 15 min  Charges:  $Therapeutic Activity: 8-22 mins                     Chesley Noon, PTA 10/14/18, 9:26 AM

## 2018-10-14 NOTE — Plan of Care (Signed)
  Problem: Safety: Goal: Ability to remain free from injury will improve Outcome: Progressing  Moved closer to nursing station

## 2018-10-15 LAB — PROTIME-INR
INR: 1.85
Prothrombin Time: 21.1 seconds — ABNORMAL HIGH (ref 11.4–15.2)

## 2018-10-15 MED ORDER — WARFARIN SODIUM 4 MG PO TABS
4.0000 mg | ORAL_TABLET | Freq: Once | ORAL | Status: AC
  Administered 2018-10-15: 4 mg via ORAL
  Filled 2018-10-15: qty 1

## 2018-10-15 NOTE — Progress Notes (Signed)
Douglas Silva at [place] NAME: Douglas Silva    MR#:  [id number]  DATE OF BIRTH:  1951-08-23  SUBJECTIVE:  CHIEF COMPLAINT:   Chief Complaint  Patient presents with  . Weakness  Patient seen and evaluated today Physical therapy recommending rehab and patient wants to go to rehab.Marland Kitchen  REVIEW OF SYSTEMS:    ROS  CONSTITUTIONAL: No documented fever. No fatigue, weakness. No weight gain, no weight loss.  EYES: No blurry or double vision.  ENT: No tinnitus. No postnasal drip. No redness of the oropharynx.  RESPIRATORY: No cough, no wheeze, no hemoptysis. No dyspnea.  CARDIOVASCULAR: No chest pain. No orthopnea. No palpitations. No syncope.  GASTROINTESTINAL: No nausea, no vomiting or diarrhea. No abdominal pain. No melena or hematochezia.  GENITOURINARY: No dysuria or hematuria.  ENDOCRINE: No polyuria or nocturia. No heat or cold intolerance.  HEMATOLOGY: No anemia. No bruising. No bleeding.  INTEGUMENTARY: No rashes. No lesions.  MUSCULOSKELETAL: No arthritis. No swelling. No gout.  Has tremor in the upper extremities from Parkinson's disease NEUROLOGIC: No numbness, tingling, or ataxia. No seizure-type activity.  PSYCHIATRIC: No anxiety. No insomnia. No ADD.   DRUG ALLERGIES:  No Known Allergies  VITALS:  Blood pressure 116/82, pulse 76, temperature (!) 97.5 F (36.4 C), temperature source Oral, resp. rate 14, height 6' (1.829 m), weight 92.1 kg, SpO2 97 %.  PHYSICAL EXAMINATION:   Physical Exam  GENERAL:  67 y.o.-year-old patient lying in the bed with no acute distress.  Noted to have tremors. EYES: Pupils equal, round, reactive to light and accommodation. No scleral icterus. Extraocular muscles intact.  HEENT: Head atraumatic, normocephalic. Oropharynx and nasopharynx clear.  NECK:  Supple, no jugular venous distention. No thyroid enlargement, no tenderness.  LUNGS: Normal breath sounds bilaterally, no wheezing, rales, rhonchi. No  use of accessory muscles of respiration.  CARDIOVASCULAR: S1, S2 normal. No murmurs, rubs, or gallops.  ABDOMEN: Soft, nontender, nondistended. Bowel sounds present. No organomegaly or mass.  EXTREMITIES: No cyanosis, clubbing or edema b/l.   Has tremor in the upper extremities NEUROLOGIC: Cranial nerves II through XII are intact. No focal Motor or sensory deficits b/l.   PSYCHIATRIC: The patient is alert and oriented x 3.  SKIN: No obvious rash, lesion, or ulcer.   LABORATORY PANEL:   CBC Recent Labs  Lab 10/12/18 0337 10/14/18 0432  WBC 9.6  --   HGB 12.5* 12.7*  HCT 39.6 40.2  PLT 210  --    ------------------------------------------------------------------------------------------------------------------ Chemistries  Recent Labs  Lab 10/11/18 0537  10/13/18 0301  NA 139   < > 141  K 3.7   < > 3.9  CL 105   < > 109  CO2 26   < > 24  GLUCOSE 102*   < > 94  BUN 36*   < > 24*  CREATININE 1.44*   < > 0.89  CALCIUM 9.3   < > 8.8*  AST 164*  --   --   ALT 40  --   --   ALKPHOS 51  --   --   BILITOT 1.4*  --   --    < > = values in this interval not displayed.   ------------------------------------------------------------------------------------------------------------------  Cardiac Enzymes Recent Labs  Lab 10/11/18 1527  TROPONINI <0.03   ------------------------------------------------------------------------------------------------------------------  RADIOLOGY:  No results found.   ASSESSMENT AND PLAN:   67 year old male patient with history of Parkinson's disease, atrial fibrillation, cardiomyopathy, benign prostate hypertrophy, coronary disease,  hypertension currently under hospitalist service for hypotension  -Severe orthostatic hypotension Improved, received IV fluids, discontinue IV fluids today and see how the BP is today and possible discharge tomorrow.  Patient wants to go home with home health but physical therapy is recommending SNF placement.  I  will talk to him about that again tomorrow.  He says that he has 3 caregivers at home. Use Flomax at night.   -Ambulatory dysfunction secondary to orthostatic hypotension and Parkinson's disease Physical therapy recommends SNF placement.  -Parkinson's disease Continue oral Sinemet  -Near-syncope secondary to orthostatic hypotension, improved. I -Chronic atrial fibrillation Continue anticoagulation with Coumadin, INR is therapeutic Pharmacy to dose  -CT head, CT angiogram of the head and neck are all negative for any hemorrhage  -Acute on chronic kidney disease stage III improved with IV fluids  -Disposition Once orthostatic hypotension is resolved,PT recommends rehab,  Likely discharge  To  rehab  When bed  is available. All the records are reviewed and case discussed with Care Management/Social Worker. Management plans discussed with the patient, family and they are in agreement.  CODE STATUS: Full code  DVT Prophylaxis: SCDs  TOTAL TIME TAKING CARE OF THIS PATIENT: 34 minutes.   POSSIBLE D/C IN 1 to 2 DAYS, DEPENDING ON CLINICAL CONDITION.  Epifanio Lesches M.D on 10/15/2018 at 2:12 PM  Between 7am to 6pm - Pager - [phone]  After 6pm go to [email or web address] - password [email or web address] [facility]  Office  [phone]  CC: Primary care physician; Remi Haggard, FNP  Note: This dictation was prepared with Dragon dictation along with smaller phrase technology. Any transcriptional errors that result from this process are unintentional.

## 2018-10-15 NOTE — Plan of Care (Signed)
  Problem: Clinical Measurements: Goal: Diagnostic test results will improve Outcome: Progressing Goal: Respiratory complications will improve Outcome: Progressing   Problem: Activity: Goal: Risk for activity intolerance will decrease Outcome: Not Progressing Note:  Patient still easily fatigued. Requiring +2 assist

## 2018-10-15 NOTE — Progress Notes (Signed)
[facility] [facility] for warfarin Indication: atrial fibrillation  No Known Allergies  Patient Measurements: Height: 6' (182.9 cm) Weight: 203 lb (92.1 kg) IBW/kg (Calculated) : 77.6  Vital Signs: Temp: 97.5 F (36.4 C) (12/22 0810) Temp Source: Oral (12/22 0810) BP: 116/82 (12/22 0810) Pulse Rate: 76 (12/22 0810)  Labs: Recent Labs    10/13/18 0301 10/14/18 0432 10/15/18 0347  HGB  --  12.7*  --   HCT  --  40.2  --   LABPROT 31.1* 25.5* 21.1*  INR 3.05 2.36 1.85  CREATININE 0.89  --   --     Estimated Creatinine Clearance: 89.6 mL/min (by C-G formula based on SCr of 0.89 mg/dL).   Medical History: Past Medical History:  Diagnosis Date  . Atrial fibrillation ([place])   . BPH (benign prostatic hyperplasia)   . Cardiomyopathy ([place])   . Coronary artery disease   . Hypertension   . Hypothyroidism   . Parkinson's disease [facility])    Assessment: 67 year old male with h/o atrial fibrillation on warfarin PTA. Per patient, he takes 5 mg daily. He took his dose 12/17 pm. Patient presented 12/18 for a fall. Initial concern for possible left frontal lobe bleed on CT, but this was subsequently ruled out by CTA. Patient has been cleared by Neurosurgery per ED MD. Pharmacy has been consulted for warfarin dosing.   Date INR Dose 12/18 3.27 4 mg 12/19 3.20 3 mg 12/20  3.05 3 mg 12/21 2.36 3 mg 12/22 1.85  Goal of Therapy:  INR 2-3 Monitor platelets by anticoagulation protocol: Yes   Plan:  Patient has been cleared of possible head bleed.  Warfarin 4 mg PO x1 tonight at 1800. INR with morning labs.   Chinita Greenland PharmD Clinical Pharmacist 10/15/2018

## 2018-10-15 NOTE — Plan of Care (Signed)
  Problem: Health Behavior/Discharge Planning: Goal: Ability to manage health-related needs will improve Outcome: Progressing   Problem: Clinical Measurements: Goal: Will remain free from infection Outcome: Progressing Note:  Remains afebrile Goal: Diagnostic test results will improve Outcome: Progressing Note:  K 3.9, PT 21.1/1.85 Goal: Respiratory complications will improve Outcome: Progressing Goal: Cardiovascular complication will be avoided Outcome: Progressing   Problem: Activity: Goal: Risk for activity intolerance will decrease Outcome: Progressing   Problem: Nutrition: Goal: Adequate nutrition will be maintained Outcome: Progressing   Problem: Coping: Goal: Level of anxiety will decrease Outcome: Progressing   Problem: Elimination: Goal: Will not experience complications related to bowel motility Outcome: Progressing Goal: Will not experience complications related to urinary retention Outcome: Progressing   Problem: Pain Managment: Goal: General experience of comfort will improve Outcome: Progressing   Problem: Skin Integrity: Goal: Risk for impaired skin integrity will decrease Outcome: Progressing

## 2018-10-16 LAB — PROTIME-INR
INR: 1.79
Prothrombin Time: 20.6 seconds — ABNORMAL HIGH (ref 11.4–15.2)

## 2018-10-16 MED ORDER — HYDROXYZINE HCL 10 MG PO TABS
10.0000 mg | ORAL_TABLET | Freq: Three times a day (TID) | ORAL | Status: DC | PRN
  Administered 2018-10-16: 10 mg via ORAL
  Filled 2018-10-16 (×2): qty 1

## 2018-10-16 MED ORDER — WARFARIN SODIUM 5 MG PO TABS
5.0000 mg | ORAL_TABLET | Freq: Once | ORAL | Status: AC
  Administered 2018-10-16: 5 mg via ORAL
  Filled 2018-10-16: qty 1

## 2018-10-16 NOTE — Progress Notes (Signed)
[place] for warfarin Indication: atrial fibrillation  No Known Allergies  Patient Measurements: Height: 6' (182.9 cm) Weight: 203 lb (92.1 kg) IBW/kg (Calculated) : 77.6  Vital Signs: Temp: 97.7 F (36.5 C) (12/23 0457) BP: 117/84 (12/23 0457) Pulse Rate: 97 (12/23 0457)  Labs: Recent Labs    10/14/18 0432 10/15/18 0347 10/16/18 0307  HGB 12.7*  --   --   HCT 40.2  --   --   LABPROT 25.5* 21.1* 20.6*  INR 2.36 1.85 1.79    Estimated Creatinine Clearance: 89.6 mL/min (by C-G formula based on SCr of 0.89 mg/dL).   Medical History: Past Medical History:  Diagnosis Date  . Atrial fibrillation ([place])   . BPH (benign prostatic hyperplasia)   . Cardiomyopathy ([place])   . Coronary artery disease   . Hypertension   . Hypothyroidism   . Parkinson's disease [facility])    Assessment: 67 year old male with h/o atrial fibrillation on warfarin PTA. Per patient, he takes 5 mg daily. He took his dose 12/17 pm. Patient presented 12/18 for a fall. Initial concern for possible left frontal lobe bleed on CT, but this was subsequently ruled out by CTA. Patient has been cleared by Neurosurgery per ED MD. Pharmacy has been consulted for warfarin dosing.   Date INR Dose 12/18 3.27 4 mg 12/19 3.20 3 mg 12/20  3.05 3 mg 12/21 2.36 3 mg 12/22 1.85     4mg  12/23   1.79     5mg   Goal of Therapy:  INR 2-3 Monitor platelets by anticoagulation protocol: Yes   Plan:  Patient has been cleared of possible head bleed.  Warfarin 5 mg PO x1 tonight at 1800 (this is the home dose).  INR with morning labs.   Lu Duffel, PharmD, BCPS Clinical Pharmacist 10/16/2018 7:39 AM

## 2018-10-16 NOTE — Progress Notes (Signed)
[place] [facility] [place] NAME: Douglas Silva    MR#:  [id number]  DATE OF BIRTH:  12-03-1950  SUBJECTIVE:  CHIEF COMPLAINT:   Chief Complaint  Patient presents with  . Weakness  Patient still has baseline tremors due to underlying Parkinson disease but no other complaints.  REVIEW OF SYSTEMS:    ROS  CONSTITUTIONAL: No documented fever. No fatigue, weakness. No weight gain, no weight loss.  EYES: No blurry or double vision.  ENT: No tinnitus. No postnasal drip. No redness of the oropharynx.  RESPIRATORY: No cough, no wheeze, no hemoptysis. No dyspnea.  CARDIOVASCULAR: No chest pain. No orthopnea. No palpitations. No syncope.  GASTROINTESTINAL: No nausea, no vomiting or diarrhea. No abdominal pain. No melena or hematochezia.  GENITOURINARY: No dysuria or hematuria.  ENDOCRINE: No polyuria or nocturia. No heat or cold intolerance.  HEMATOLOGY: No anemia. No bruising. No bleeding.  INTEGUMENTARY: No rashes. No lesions.  MUSCULOSKELETAL: No arthritis. No swelling. No gout.  Has tremor in the upper extremities from Parkinson's disease NEUROLOGIC: No numbness, tingling, or ataxia. No seizure-type activity.  PSYCHIATRIC: No anxiety. No insomnia. No ADD.   DRUG ALLERGIES:  No Known Allergies  VITALS:  Blood pressure 126/74, pulse 97, temperature (!) 97.5 F (36.4 C), temperature source Oral, resp. rate 18, height 6' (1.829 m), weight 92.1 kg, SpO2 97 %.  PHYSICAL EXAMINATION:   Physical Exam  GENERAL:  67 y.o.-year-old patient lying in the bed with no acute distress.  Noted to have tremors. EYES: Pupils equal, round, reactive to light and accommodation. No scleral icterus. Extraocular muscles intact.  HEENT: Head atraumatic, normocephalic. Oropharynx and nasopharynx clear.  NECK:  Supple, no jugular venous distention. No thyroid enlargement, no tenderness.  LUNGS: Normal breath sounds bilaterally, no wheezing, rales, rhonchi. No use of  accessory muscles of respiration.  CARDIOVASCULAR: S1, S2 normal. No murmurs, rubs, or gallops.  ABDOMEN: Soft, nontender, nondistended. Bowel sounds present. No organomegaly or mass.  EXTREMITIES: No cyanosis, clubbing or edema b/l.   Has tremor in the upper extremities NEUROLOGIC: Cranial nerves II through XII are intact. No focal Motor or sensory deficits b/l.   PSYCHIATRIC: The patient is alert and oriented x 3.  SKIN: No obvious rash, lesion, or ulcer.   LABORATORY PANEL:   CBC Recent Labs  Lab 10/12/18 0337 10/14/18 0432  WBC 9.6  --   HGB 12.5* 12.7*  HCT 39.6 40.2  PLT 210  --    ------------------------------------------------------------------------------------------------------------------ Chemistries  Recent Labs  Lab 10/11/18 0537  10/13/18 0301  NA 139   < > 141  K 3.7   < > 3.9  CL 105   < > 109  CO2 26   < > 24  GLUCOSE 102*   < > 94  BUN 36*   < > 24*  CREATININE 1.44*   < > 0.89  CALCIUM 9.3   < > 8.8*  AST 164*  --   --   ALT 40  --   --   ALKPHOS 51  --   --   BILITOT 1.4*  --   --    < > = values in this interval not displayed.   ------------------------------------------------------------------------------------------------------------------  Cardiac Enzymes Recent Labs  Lab 10/11/18 1527  TROPONINI <0.03   ------------------------------------------------------------------------------------------------------------------  RADIOLOGY:  No results found.   ASSESSMENT AND PLAN:   67 year old male patient with history of Parkinson's disease, atrial fibrillation, cardiomyopathy, benign prostate hypertrophy, coronary disease, hypertension currently  under hospitalist service for hypotension  -Severe orthostatic hypotension Improved, received IV fluids, discontinue IV fluids today and see how the BP is today and possible discharge tomorrow.  Patient wants to go home with home health but physical therapy is recommending SNF placement.  I will  talk to him about that again tomorrow.  He says that he has 3 caregivers at home. Use Flomax at night.   -Ambulatory dysfunction secondary to orthostatic hypotension and Parkinson's disease Physical therapy recommends SNF placement.  Now Patient wants to go home with home health.  -Parkinson's disease Continue oral Sinemet  -Near-syncope secondary to orthostatic hypotension, improved. I -Chronic atrial fibrillation Continue anticoagulation with Coumadin, INR is less than 2, pharmacy to dose Coumadin. -CT head, CT angiogram of the head and neck are all negative for any hemorrhage  -Acute on chronic kidney disease stage III improved with IV fluids  -Disposition Likely discharge home with home health, he is off IV fluids, monitor today for further BP fluctuations, if the BP stays systolic more than 098 likely discharge home with home health tomorrow.  Likely discharge  To  rehab  When bed  is available. All the records are reviewed and case discussed with Care Management/Social Worker. Management plans discussed with the patient, family and they are in agreement.  CODE STATUS: Full code  DVT Prophylaxis: SCDs  TOTAL TIME TAKING CARE OF THIS PATIENT: 34 minutes.   POSSIBLE D/C IN 1 to 2 DAYS, DEPENDING ON CLINICAL CONDITION.  Epifanio Lesches M.D on 10/16/2018 at 12:47 PM  Between 7am to 6pm - Pager - [phone]  After 6pm go to [email or web address] - password [email or web address] [facility]  Office  [phone]  CC: Primary care physician; Remi Haggard, FNP  Note: This dictation was prepared with Dragon dictation along with smaller phrase technology. Any transcriptional errors that result from this process are unintentional.

## 2018-10-16 NOTE — Progress Notes (Signed)
Clinical Social Worker (CSW) met with patient to discuss D/C plan. Patient is agreeable to SNF. CSW presented bed offers and patient chose [facility]. [facility] liaison is aware of accepted bed offer.   [employer], LCSW [phone]

## 2018-10-16 NOTE — Care Management Important Message (Signed)
Copy of signed Medicare IM left with patient in room. 

## 2018-10-16 NOTE — Clinical Social Work Placement (Signed)
   CLINICAL SOCIAL WORK PLACEMENT  NOTE  Date:  10/16/2018  Patient Details  Name: Douglas Silva. MRN: [id number] Date of Birth: 04/25/1951  Clinical Social Work is seeking post-discharge placement for this patient at the [place] level of care (*CSW will initial, date and re-position this form in  chart as items are completed):  Yes   Patient/family provided with [facility] Work Department's list of facilities offering this level of care within the geographic area requested by the patient (or if unable, by the patient's family).  Yes   Patient/family informed of their freedom to choose among providers that offer the needed level of care, that participate in Medicare, Medicaid or managed care program needed by the patient, have an available bed and are willing to accept the patient.  Yes   Patient/family informed of [facility]'s ownership interest in [facility] and [facility], as well as of the fact that they are under no obligation to receive care at these facilities.  PASRR submitted to EDS on       PASRR number received on       Existing PASRR number confirmed on 10/13/18     FL2 transmitted to all facilities in geographic area requested by pt/family on 10/13/18     FL2 transmitted to all facilities within larger geographic area on       Patient informed that his/her managed care company has contracts with or will negotiate with certain facilities, including the following:        Yes   Patient/family informed of bed offers received.  Patient chooses bed at ([facility] )     Physician recommends and patient chooses bed at      Patient to be transferred to   on  .  Patient to be transferred to facility by       Patient family notified on   of transfer.  Name of family member notified:        PHYSICIAN       Additional Comment:    _______________________________________________ Marguita Venning, Veronia Beets, LCSW 10/16/2018, 5:42  PM

## 2018-10-17 LAB — PROTIME-INR
INR: 1.65
Prothrombin Time: 19.3 seconds — ABNORMAL HIGH (ref 11.4–15.2)

## 2018-10-17 LAB — CBC
HCT: 40 % (ref 39.0–52.0)
Hemoglobin: 13.2 g/dL (ref 13.0–17.0)
MCH: 30.9 pg (ref 26.0–34.0)
MCHC: 33 g/dL (ref 30.0–36.0)
MCV: 93.7 fL (ref 80.0–100.0)
Platelets: 233 10*3/uL (ref 150–400)
RBC: 4.27 MIL/uL (ref 4.22–5.81)
RDW: 12.4 % (ref 11.5–15.5)
WBC: 11.4 10*3/uL — ABNORMAL HIGH (ref 4.0–10.5)
nRBC: 0 % (ref 0.0–0.2)

## 2018-10-17 MED ORDER — WARFARIN SODIUM 7.5 MG PO TABS: 7.5000 mg | ORAL_TABLET | Freq: Once | ORAL | 0 refills | Status: DC

## 2018-10-17 MED ORDER — WARFARIN SODIUM 7.5 MG PO TABS
7.5000 mg | ORAL_TABLET | Freq: Once | ORAL | Status: DC
  Filled 2018-10-17: qty 1

## 2018-10-17 NOTE — Clinical Social Work Note (Signed)
Patient to be d/c'ed today to [facility] room 801.  Patient and family agreeable to plans will transport via ems RN to call report [phone].  Evette Cristal, MSW, [place]

## 2018-10-17 NOTE — Plan of Care (Signed)

## 2018-10-17 NOTE — Progress Notes (Signed)
Report given to nicole at [facility]. IV's taken out and tele monitor off. Vital signs WDL at discharge. Patient transported via EMS.

## 2018-10-17 NOTE — Progress Notes (Signed)
Physical Therapy Treatment Patient Details Name: Douglas Silva. MRN: [id number] DOB: 1951-09-16 Today's Date: 10/17/2018    History of Present Illness Pt is 67 yo male admitted for hypotension and s/p fall, dehydration. PMH of Parkinson's disease, afib, HTN, CAD.    PT Comments    Pt in bed, requesting to use bedside commode for BM.  Mod assist for bed mobility with verbal cues for hand placements.  Once sitting he can hold balance with supervision.  While he has good power to stand, balance is a barrier and requires +2 assist at all times for safety.  He has significant post lean at times with poor righting ability.  He maintains a very narrow BOS with short shuffling steps often stepping on and crossing his feet.  Commode was initially places about 5' from bed requiring his to turn but he was unable to safely transfer and it was pulled closer.  He returned to bed with similar assist and required max a x 2 to reposition. SNF remains appropriate upon discharge.     Follow Up Recommendations  SNF     Equipment Recommendations       Recommendations for Other Services       Precautions / Restrictions Precautions Precautions: Fall Restrictions Weight Bearing Restrictions: No    Mobility  Bed Mobility Overal bed mobility: Needs Assistance Bed Mobility: Supine to Sit;Sit to Supine     Supine to sit: Mod assist Sit to supine: Mod assist   General bed mobility comments: max +2 to reposition in bed.  Transfers Overall transfer level: Needs assistance Equipment used: Rolling walker (2 wheeled)                Ambulation/Gait Ambulation/Gait assistance: Mod assist;Min assist;+2 physical assistance Gait Distance (Feet): 3 Feet Assistive device: Rolling walker (2 wheeled) Gait Pattern/deviations: Shuffle;Narrow base of support;Leaning posteriorly Gait velocity: decreased   General Gait Details: Very small shuffling steps, post lean requires constant +2 assist for  safety     Stairs             Wheelchair Mobility    Modified Rankin (Stroke Patients Only)       Balance Overall balance assessment: Needs assistance Sitting-balance support: Feet supported Sitting balance-Leahy Scale: Fair Sitting balance - Comments: Pt intermittently needed CGA to maintain seated balance, improved with feet supported and verbal cues   Standing balance support: Bilateral upper extremity supported Standing balance-Leahy Scale: Poor Standing balance comment: significant post lean requires assist for upright                             Cognition Arousal/Alertness: Awake/alert Behavior During Therapy: WFL for tasks assessed/performed Overall Cognitive Status: Within Functional Limits for tasks assessed                                        Exercises Other Exercises Other Exercises: to commode for BM.  Nursing in for care and to assist with transfer.    General Comments        Pertinent Vitals/Pain Pain Assessment: No/denies pain    Home Living                      Prior Function            PT Goals (current goals can now be found  in the care plan section) Progress towards PT goals: Progressing toward goals    Frequency    Min 2X/week      PT Plan Current plan remains appropriate    Co-evaluation              AM-PAC PT "6 Clicks" Mobility   Outcome Measure  Help needed turning from your back to your side while in a flat bed without using bedrails?: A Lot Help needed moving from lying on your back to sitting on the side of a flat bed without using bedrails?: A Lot Help needed moving to and from a bed to a chair (including a wheelchair)?: A Lot Help needed standing up from a chair using your arms (e.g., wheelchair or bedside chair)?: A Lot Help needed to walk in hospital room?: A Lot Help needed climbing 3-5 steps with a railing? : Total 6 Click Score: 11    End of Session Equipment  Utilized During Treatment: Gait belt Activity Tolerance: Patient limited by fatigue Patient left: in bed;with bed alarm set;with call bell/phone within reach;with nursing/sitter in room Nurse Communication: Mobility status       Time: 4332-9518 PT Time Calculation (min) (ACUTE ONLY): 9 min  Charges:  $Therapeutic Activity: 8-22 mins                     Chesley Noon, PTA 10/17/18, 10:13 AM

## 2018-10-17 NOTE — Discharge Summary (Signed)
Douglas Silva., is a 67 y.o. male  DOB Mar 04, 1951  MRN [id number].  Admission date:  10/11/2018  Admitting Physician  Loletha Grayer, MD  Discharge Date:  10/17/2018   Primary MD  Remi Haggard, FNP  Recommendations for primary care physician for things to follow:   [facility] when bed is available.  With PCP in 1 week Follow-up with neurologist as scheduled regarding his Parkinson disease.   Admission Diagnosis  Elevated troponin [R79.89] Generalized weakness [R53.1] Syncope, unspecified syncope type [R55]   Discharge Diagnosis  Elevated troponin [R79.89] Generalized weakness [R53.1] Syncope, unspecified syncope type [R55]    Active Problems:   Hypotension   Orthostatic hypotension      Past Medical History:  Diagnosis Date  . Atrial fibrillation ([place])   . BPH (benign prostatic hyperplasia)   . Cardiomyopathy ([place])   . Coronary artery disease   . Hypertension   . Hypothyroidism   . Parkinson's disease [facility])     Past Surgical History:  Procedure Laterality Date  . COLONOSCOPY N/A 04/10/2015   Procedure: COLONOSCOPY;  Surgeon: Manya Silvas, MD;  Location: [facility] ENDOSCOPY;  Service: Endoscopy;  Laterality: N/A;  . CORONARY ANGIOPLASTY    . DIALYSIS/PERMA CATHETER INSERTION N/A 09/05/2017   Procedure: DIALYSIS/PERMA CATHETER INSERTION;  Surgeon: Algernon Huxley, MD;  Location: [place] [facility];  Service: Cardiovascular;  Laterality: N/A;       History of present illness and  Hospital Course:     Kindly see H&P for history of present illness and admission details, please review complete Labs, Consult reports and Test reports for all details in brief  HPI  from the history and physical done on the day of admission 67 year old male patient with Parkinson disease, BPH  chronic A. fib on Coumadin.  Mated for generalized weakness.  Found to have hypotension. Hospital Course   Generalized weakness secondary to orthostatic hypotension: Received aggressive hydration, seen by physical therapy recommended rehab, [facility].  Hypotension improved. 2.  History of fall, patient had falls secondary to underlying Parkinson disease, dehydration. 3.  Chronic atrial fibrillation, on Coumadin, dose has been adjusted. 4.  History of BPH: We will use Proscar, discontinue Flomax at discharge secondary to hypotension, fall. 5.  Acute on chronic renal failure with CKD stage III: Improved with IV fluids #6 Parkinson's disease.  Patient is on Sinemet as per discharge reconciliation. 7 near syncope due to orthostatic hypotension: Improved. Discharge Condition: stable   Follow UP  Contact information for after-discharge care    Destination    [facility]erred [facility] .   Service:  Skilled Nursing Contact information: [address] [place] [phone]                Discharge Instructions  and  Discharge Medications      Allergies as of 10/17/2018   No Known Allergies     Medication List    STOP taking these medications   tamsulosin 0.4 MG Caps capsule Commonly known as:  FLOMAX     TAKE these medications   carbidopa-levodopa 25-100 MG tablet Commonly known as:  SINEMET IR Take 1 tablet by mouth 4 (four) times daily.   ibuprofen 400 MG tablet Commonly known as:  ADVIL,MOTRIN Take 1 tablet (400 mg total) by mouth every 8 (eight) hours as needed. What changed:  reasons to take this   levothyroxine 100  MCG tablet Commonly known as:  SYNTHROID, LEVOTHROID Take 100 mcg by mouth daily before breakfast.   metoprolol succinate 25 MG 24 hr tablet Commonly known as:  TOPROL-XL Take 25 mg by mouth daily.   simvastatin 20 MG tablet Commonly known as:  ZOCOR Take 1  tablet (20 mg total) by mouth daily. What changed:  when to take this   warfarin 7.5 MG tablet Commonly known as:  COUMADIN Take 1 tablet (7.5 mg total) by mouth one time only at 6 PM. Can take 7.5 mg of Coumadin today, tomorrow, needs to check [facility]R at [facility] and adjust the dose of Coumadin according to the INR level. What changed:    medication strength  how much to take  when to take this  additional instructions         Diet and Activity recommendation: See Discharge Instructions above   Consults obtained -case manager   Major procedures and Radiology Reports - PLEASE review detailed and final reports for all details, in brief -      Ct Angio Head W Or Wo Contrast  Result Date: 10/11/2018 CLINICAL DATA:  Fall.  Intracranial hemorrhage on CT EXAM: CT ANGIOGRAPHY HEAD AND NECK TECHNIQUE: Multidetector CT imaging of the head and neck was performed using the standard protocol during bolus administration of intravenous contrast. Multiplanar CT image reconstructions and MIPs were obtained to evaluate the vascular anatomy. Carotid stenosis measurements (when applicable) are obtained utilizing NASCET criteria, using the distal internal carotid diameter as the denominator. CONTRAST:  53mL ISOVUE-370 IOPAMIDOL (ISOVUE-370) INJECTION 76% COMPARISON:  CT head 10/11/2018 FINDINGS: CTA NECK FINDINGS Aortic arch: Standard branching. Imaged portion shows no evidence of aneurysm or dissection. No significant stenosis of the major arch vessel origins. Right carotid system: Normal right carotid. Negative for stenosis or atherosclerotic disease Left carotid system: Normal left carotid. Negative for stenosis or atherosclerotic disease Vertebral arteries: Both vertebral arteries are normal and patent to the basilar. Skeleton: Cervical kyphosis likely positional. No acute skeletal abnormality. Other neck: Negative for mass or adenopathy Upper chest: Lung apices clear bilaterally. Review of  the MIP images confirms the above findings CTA HEAD FINDINGS Anterior circulation: Cavernous carotid widely patent. Negative for aneurysm or stenosis. No significant atherosclerotic disease. Anterior and middle cerebral arteries are widely patent bilaterally. Negative for intracranial hemorrhage. The hyperdensity in the right lower frontal lobe on the earlier CT is consistent with streak artifact from the adjacent bony structures. Posterior circulation: Both vertebral arteries widely patent to the basilar. Basilar widely patent. PICA, superior cerebellar, and posterior cerebral arteries are normal bilaterally. No stenosis or aneurysm. Venous sinuses: Patent Anatomic variants: None Delayed phase: Normal enhancement on delayed hemorrhage imaging. Negative for hemorrhage mass or infarction. Review of the MIP images confirms the above findings IMPRESSION: 1. Negative for intracranial hemorrhage. Review of the recent head CT demonstrates streak artifact in the right frontal lobe but no hemorrhage 2. Negative CTA head neck. Negative for intracranial or extracranial stenosis. Negative for vascular malformation. Electronically Signed   By: Franchot Gallo M.D.   On: 10/11/2018 08:33   Dg Chest 1 View  Result Date: 10/11/2018 CLINICAL DATA:  Altered mental status.  Hypertension. EXAM: CHEST  1 VIEW COMPARISON:  August 21, 2018 FINDINGS: There is mild atelectatic change and scarring in the left base. There is no edema or consolidation. Heart is borderline enlarged with pulmonary vascularity normal. No adenopathy. There is a probable hiatal hernia. IMPRESSION: Slight scarring and atelectasis left base. No edema  or consolidation. Stable cardiac prominence. Apparent hiatal hernia. Electronically Signed   By: Lowella Grip III M.D.   On: 10/11/2018 07:23   Ct Head Wo Contrast  Result Date: 10/11/2018 CLINICAL DATA:  Altered mental status.  Parkinson's disease EXAM: CT HEAD WITHOUT CONTRAST TECHNIQUE: Contiguous  axial images were obtained from the base of the skull through the vertex without intravenous contrast. COMPARISON:  None. FINDINGS: Brain: There is age related volume loss. There is a focal area of hemorrhage in the anterior inferior right frontal lobe, best appreciated on coronal and sagittal images. No other hemorrhage is evident. There is no mass, extra-axial fluid collection, or midline shift. There is mild small vessel disease in the centra semiovale bilaterally. Brain parenchyma elsewhere appears unremarkable. Vascular: No hyperdense vessel. There is no appreciable vascular calcification. Skull: Bony calvarium appears intact. Sinuses/Orbits: There is mucosal thickening in the right maxillary antrum. There is mucosal thickening in several ethmoid air cells. Orbits appear symmetric bilaterally. There is evidence of cataract removals bilaterally. Other: Mastoid air cells are clear. IMPRESSION: Focal hemorrhage inferior right frontal lobe region. Suspect amyloid angiopathy given this appearance. No other foci of hemorrhage evident. There is age related volume loss with mild periventricular small vessel disease. There is mucosal thickening in several paranasal sinus regions. Critical Value/emergent results were called by telephone at the time of interpretation on 10/11/2018 at 7:21 am to Dr. Lurline Hare , who verbally acknowledged these results. Electronically Signed   By: Lowella Grip III M.D.   On: 10/11/2018 07:21   Ct Angio Neck W And/or Wo Contrast  Result Date: 10/11/2018 CLINICAL DATA:  Fall.  Intracranial hemorrhage on CT EXAM: CT ANGIOGRAPHY HEAD AND NECK TECHNIQUE: Multidetector CT imaging of the head and neck was performed using the standard protocol during bolus administration of intravenous contrast. Multiplanar CT image reconstructions and MIPs were obtained to evaluate the vascular anatomy. Carotid stenosis measurements (when applicable) are obtained utilizing NASCET criteria, using the  distal internal carotid diameter as the denominator. CONTRAST:  64mL ISOVUE-370 IOPAMIDOL (ISOVUE-370) INJECTION 76% COMPARISON:  CT head 10/11/2018 FINDINGS: CTA NECK FINDINGS Aortic arch: Standard branching. Imaged portion shows no evidence of aneurysm or dissection. No significant stenosis of the major arch vessel origins. Right carotid system: Normal right carotid. Negative for stenosis or atherosclerotic disease Left carotid system: Normal left carotid. Negative for stenosis or atherosclerotic disease Vertebral arteries: Both vertebral arteries are normal and patent to the basilar. Skeleton: Cervical kyphosis likely positional. No acute skeletal abnormality. Other neck: Negative for mass or adenopathy Upper chest: Lung apices clear bilaterally. Review of the MIP images confirms the above findings CTA HEAD FINDINGS Anterior circulation: Cavernous carotid widely patent. Negative for aneurysm or stenosis. No significant atherosclerotic disease. Anterior and middle cerebral arteries are widely patent bilaterally. Negative for intracranial hemorrhage. The hyperdensity in the right lower frontal lobe on the earlier CT is consistent with streak artifact from the adjacent bony structures. Posterior circulation: Both vertebral arteries widely patent to the basilar. Basilar widely patent. PICA, superior cerebellar, and posterior cerebral arteries are normal bilaterally. No stenosis or aneurysm. Venous sinuses: Patent Anatomic variants: None Delayed phase: Normal enhancement on delayed hemorrhage imaging. Negative for hemorrhage mass or infarction. Review of the MIP images confirms the above findings IMPRESSION: 1. Negative for intracranial hemorrhage. Review of the recent head CT demonstrates streak artifact in the right frontal lobe but no hemorrhage 2. Negative CTA head neck. Negative for intracranial or extracranial stenosis. Negative for vascular malformation. Electronically Signed  By: Franchot Gallo M.D.   On:  10/11/2018 08:33    Micro Results    No results found for this or any previous visit (from the past 240 hour(s)).     Today   Subjective:   Imad Shostak today has no headache,no chest abdominal pain,no new weakness tingling or numbness, feels much better wants to go home today.   Objective:   Blood pressure 117/79, pulse 97, temperature (!) 97.4 F (36.3 C), resp. rate 16, height 6' (1.829 m), weight 92.1 kg, SpO2 98 %.   Intake/Output Summary (Last 24 hours) at 10/17/2018 0910 Last data filed at 10/16/2018 2237 Gross per 24 hour  Intake -  Output 350 ml  Net -350 ml    Exam Awake Alert, Oriented x 3, No new F.N deficits, Normal affect, has baseline tremors [place].AT,PERRAL Supple Neck,No JVD, No cervical lymphadenopathy appriciated.  Symmetrical Chest wall movement, Good air movement bilaterally, CTAB RRR,No Gallops,Rubs or new Murmurs, No Parasternal Heave +ve B.Sounds, Abd Soft, Non tender, No organomegaly appriciated, No rebound -guarding or rigidity. No Cyanosis, Clubbing or edema, No new Rash or bruise  Data Review   CBC w Diff:  Lab Results  Component Value Date   WBC 11.4 (H) 10/17/2018   HGB 13.2 10/17/2018   HCT 40.0 10/17/2018   PLT 233 10/17/2018   LYMPHOPCT 16 10/11/2018   MONOPCT 12 10/11/2018   EOSPCT 1 10/11/2018   BASOPCT 0 10/11/2018    CMP:  Lab Results  Component Value Date   NA 141 10/13/2018   K 3.9 10/13/2018   CL 109 10/13/2018   CO2 24 10/13/2018   BUN 24 (H) 10/13/2018   CREATININE 0.89 10/13/2018   PROT 7.3 10/11/2018   ALBUMIN 4.1 10/11/2018   BILITOT 1.4 (H) 10/11/2018   ALKPHOS 51 10/11/2018   AST 164 (H) 10/11/2018   ALT 40 10/11/2018  .   Total Time in preparing paper work, data evaluation and todays exam - 35 minutes  Epifanio Lesches M.D on 10/17/2018 at 9:10 AM    Note: This dictation was prepared with Dragon dictation along with smaller phrase technology. Any transcriptional errors that result from  this process are unintentional.

## 2018-10-17 NOTE — Progress Notes (Signed)
[place] for warfarin Indication: atrial fibrillation  No Known Allergies  Patient Measurements: Height: 6' (182.9 cm) Weight: 203 lb (92.1 kg) IBW/kg (Calculated) : 77.6  Vital Signs: Temp: 98.4 F (36.9 C) (12/24 0416) Temp Source: Oral (12/24 0416) BP: 111/72 (12/24 0416) Pulse Rate: 74 (12/24 0416)  Labs: Recent Labs    10/15/18 0347 10/16/18 0307 10/17/18 0338  HGB  --   --  13.2  HCT  --   --  40.0  PLT  --   --  233  LABPROT 21.1* 20.6* 19.3*  INR 1.85 1.79 1.65    Estimated Creatinine Clearance: 89.6 mL/min (by C-G formula based on SCr of 0.89 mg/dL).   Medical History: Past Medical History:  Diagnosis Date  . Atrial fibrillation ([place])   . BPH (benign prostatic hyperplasia)   . Cardiomyopathy ([place])   . Coronary artery disease   . Hypertension   . Hypothyroidism   . Parkinson's disease [facility])    Assessment: 67 year old male with h/o atrial fibrillation on warfarin PTA. Per patient, he takes 5 mg daily. He took his dose 12/17 pm. Patient presented 12/18 for a fall. Initial concern for possible left frontal lobe bleed on CT, but this was subsequently ruled out by CTA. Patient has been cleared by Neurosurgery per ED MD. Pharmacy has been consulted for warfarin dosing.   Date INR Dose 12/18 3.27 4 mg 12/19 3.20 3 mg 12/20  3.05 3 mg 12/21 2.36 3 mg 12/22 1.85     4mg  12/23   1.79     5mg  12/24   1.65     7.5mg   Goal of Therapy:  INR 2-3 Monitor platelets by anticoagulation protocol: Yes   Plan:  Patient has been cleared of possible head bleed. Still sub-therapeutic this am - will increase dose   Warfarin 7.5 mg PO x1 tonight at 1800 (this 50% greater than the home dose).  INR with morning labs.   Lu Duffel, PharmD, BCPS Clinical Pharmacist 10/17/2018 7:23 AM

## 2020-01-01 ENCOUNTER — Emergency Department: Payer: Medicare Other

## 2020-01-01 ENCOUNTER — Encounter: Payer: Self-pay | Admitting: Emergency Medicine

## 2020-01-01 ENCOUNTER — Other Ambulatory Visit: Payer: Self-pay

## 2020-01-01 ENCOUNTER — Emergency Department
Admission: EM | Admit: 2020-01-01 | Discharge: 2020-01-01 | Disposition: A | Discharge status: Home / Self Care | Payer: Medicare Other | Attending: Emergency Medicine | Admitting: Emergency Medicine

## 2020-01-01 DIAGNOSIS — R531 Weakness: Secondary | ICD-10-CM | POA: Diagnosis present

## 2020-01-01 DIAGNOSIS — R52 Pain, unspecified: Secondary | ICD-10-CM | POA: Insufficient documentation

## 2020-01-01 DIAGNOSIS — R0602 Shortness of breath: Secondary | ICD-10-CM | POA: Insufficient documentation

## 2020-01-01 DIAGNOSIS — I10 Essential (primary) hypertension: Secondary | ICD-10-CM | POA: Diagnosis not present

## 2020-01-01 DIAGNOSIS — E039 Hypothyroidism, unspecified: Secondary | ICD-10-CM | POA: Diagnosis not present

## 2020-01-01 DIAGNOSIS — G20 Parkinson's disease: Secondary | ICD-10-CM | POA: Insufficient documentation

## 2020-01-01 DIAGNOSIS — Z79899 Other long term (current) drug therapy: Secondary | ICD-10-CM | POA: Diagnosis not present

## 2020-01-01 DIAGNOSIS — Z7901 Long term (current) use of anticoagulants: Secondary | ICD-10-CM | POA: Diagnosis not present

## 2020-01-01 LAB — CBC
HCT: 47.8 % (ref 39.0–52.0)
Hemoglobin: 15.4 g/dL (ref 13.0–17.0)
MCH: 30.3 pg (ref 26.0–34.0)
MCHC: 32.2 g/dL (ref 30.0–36.0)
MCV: 94.1 fL (ref 80.0–100.0)
Platelets: 225 10*3/uL (ref 150–400)
RBC: 5.08 MIL/uL (ref 4.22–5.81)
RDW: 13.1 % (ref 11.5–15.5)
WBC: 10.1 10*3/uL (ref 4.0–10.5)
nRBC: 0 % (ref 0.0–0.2)

## 2020-01-01 LAB — URINALYSIS, COMPLETE (UACMP) WITH MICROSCOPIC
Bacteria, UA: NONE SEEN
Bilirubin Urine: NEGATIVE
Glucose, UA: NEGATIVE mg/dL
Ketones, ur: 5 mg/dL — AB
Nitrite: NEGATIVE
Protein, ur: 30 mg/dL — AB
Specific Gravity, Urine: 1.021 (ref 1.005–1.030)
pH: 5 (ref 5.0–8.0)

## 2020-01-01 LAB — BASIC METABOLIC PANEL
Anion gap: 12 (ref 5–15)
BUN: 21 mg/dL (ref 8–23)
CO2: 28 mmol/L (ref 22–32)
Calcium: 9 mg/dL (ref 8.9–10.3)
Chloride: 99 mmol/L (ref 98–111)
Creatinine, Ser: 0.9 mg/dL (ref 0.61–1.24)
GFR calc Af Amer: >60 mL/min (ref >60)
GFR calc non Af Amer: >60 mL/min (ref >60)
Glucose, Bld: 94 mg/dL (ref 70–99)
Potassium: 3.3 mmol/L — ABNORMAL LOW (ref 3.5–5.1)
Sodium: 139 mmol/L (ref 135–145)

## 2020-01-01 LAB — TROPONIN I (HIGH SENSITIVITY)
Troponin I (High Sensitivity): 6 ng/L (ref <18)
Troponin I (High Sensitivity): 7 ng/L (ref <18)

## 2020-01-01 MED ORDER — SODIUM CHLORIDE 0.9 % IV BOLUS
500.0000 mL | Freq: Once | INTRAVENOUS | Status: AC
  Administered 2020-01-01: 14:00:00 500 mL via INTRAVENOUS

## 2020-01-01 NOTE — ED Provider Notes (Signed)
[facility] Emergency Department Provider Note ____________________________________________   First MD Initiated Contact with Patient 01/01/20 1212     (approximate)  I have reviewed the triage vital signs and the nursing notes.   HISTORY  Chief Complaint Chest Pain    HPI Douglas Silva. is a 69 y.o. male with PMH as noted below including Parkinson's disease and CAD who presents with generalized weakness over the last several days, gradual onset, associated with some vague discomfort throughout his body.  He denies any specific focal pain.  He specifically denies any chest pain.  He reports some shortness of breath but this is chronic.  He denies any fever chills, vomiting or diarrhea, or urinary symptoms.  He denies any recent medication changes.  Past Medical History:  Diagnosis Date  . Atrial fibrillation ([place])   . BPH (benign prostatic hyperplasia)   . Cardiomyopathy ([place])   . Coronary artery disease   . Hypertension   . Hypothyroidism   . Parkinson's disease [facility])     Patient Active Problem List   Diagnosis Date Noted  . Orthostatic hypotension 10/12/2018  . Hypotension 10/11/2018  . Sepsis ([place]) 06/01/2018  . AKI (acute kidney injury) ([place]) 09/03/2017  . Cellulitis 06/01/2017    Past Surgical History:  Procedure Laterality Date  . COLONOSCOPY N/A 04/10/2015   Procedure: COLONOSCOPY;  Surgeon: Manya Silvas, MD;  Location: [facility] ENDOSCOPY;  Service: Endoscopy;  Laterality: N/A;  . CORONARY ANGIOPLASTY    . DIALYSIS/PERMA CATHETER INSERTION N/A 09/05/2017   Procedure: DIALYSIS/PERMA CATHETER INSERTION;  Surgeon: Algernon Huxley, MD;  Location: [place] [facility];  Service: Cardiovascular;  Laterality: N/A;    Prior to Admission medications   Medication Sig Start Date End Date Taking? Authorizing Provider  carbidopa-levodopa (SINEMET IR) 25-100 MG tablet Take 1 tablet by mouth 4 (four) times daily. 05/11/18   [provider]  ibuprofen (ADVIL,MOTRIN) 400 MG tablet Take 1 tablet (400 mg total) by mouth every 8 (eight) hours as needed. Patient taking differently: Take 400 mg by mouth every 8 (eight) hours as needed for mild pain or moderate pain.  09/16/17   Fritzi Mandes, MD  levothyroxine (SYNTHROID, LEVOTHROID) 100 MCG tablet Take 100 mcg by mouth daily before breakfast.    [provider]  metoprolol succinate (TOPROL-XL) 25 MG 24 hr tablet Take 25 mg by mouth daily. 05/20/17   [provider]  simvastatin (ZOCOR) 20 MG tablet Take 1 tablet (20 mg total) by mouth daily. Patient taking differently: Take 20 mg by mouth at bedtime.  09/23/17   Fritzi Mandes, MD  warfarin (COUMADIN) 7.5 MG tablet Take 1 tablet (7.5 mg total) by mouth one time only at 6 PM. Can take 7.5 mg of Coumadin today, tomorrow, needs to check [facility]R at [facility] and adjust the dose of Coumadin according to the INR level. 10/17/18   Epifanio Lesches, MD    Allergies Patient has no known allergies.  Family History  Problem Relation Age of Onset  . Lung cancer Mother   . Esophageal cancer Father     Social History Social History   Tobacco Use  . Smoking status: Never Smoker  . Smokeless tobacco: Never Used  Substance Use Topics  . Alcohol use: No  . Drug use: No    Review of Systems  Constitutional: No fever.  Positive for weakness. Eyes: No redness. ENT: No sore throat. Cardiovascular: Denies chest pain. Respiratory: Denies acute shortness of breath. Gastrointestinal:  No vomiting or diarrhea.  Genitourinary: Negative for dysuria.  Musculoskeletal: Negative for back pain. Skin: Negative for rash. Neurological: Negative for headache.   ____________________________________________   PHYSICAL EXAM:  VITAL SIGNS: ED Triage Vitals  Enc Vitals Group     BP 01/01/20 1054 (!) 104/50     Pulse Rate 01/01/20 1054 98     Resp 01/01/20 1054 16     Temp 01/01/20 1054 (!) 97.5 F (36.4 C)     Temp  Source 01/01/20 1054 Oral     SpO2 01/01/20 1054 98 %     Weight 01/01/20 1054 177 lb (80.3 kg)     Height 01/01/20 1054 6' (1.829 m)     Head Circumference --      [facility] --      Pain Score 01/01/20 1104 0     Pain Loc --      Pain Edu? --      Excl. in [place]? --     Constitutional: Alert and oriented.  Relatively well appearing and in no acute distress. Eyes: Conjunctivae are normal.  EOMI. Head: Atraumatic. Nose: No congestion/rhinnorhea. Mouth/Throat: Mucous membranes are slightly dry.   Neck: Normal range of motion.  Cardiovascular: Normal rate, regular rhythm. Grossly normal heart sounds.  Good peripheral circulation. Respiratory: Normal respiratory effort.  No retractions. Lungs CTAB. Gastrointestinal: Soft and nontender. No distention.  Genitourinary: No flank tenderness. Musculoskeletal: No lower extremity edema.  Extremities warm and well perfused.  Neurologic:  Normal speech and language.  Motor intact in all extremities.  Diffuse tremor consistent with Parkinson's.   Skin:  Skin is warm and dry. No rash noted. Psychiatric: Mood and affect are normal. Speech and behavior are normal.  ____________________________________________   LABS (all labs ordered are listed, but only abnormal results are displayed)  Labs Reviewed  BASIC METABOLIC PANEL - Abnormal; Notable for the following components:      Result Value   Potassium 3.3 (*)    All other components within normal limits  URINALYSIS, COMPLETE (UACMP) WITH MICROSCOPIC - Abnormal; Notable for the following components:   Color, Urine AMBER (*)    APPearance CLEAR (*)    Hgb urine dipstick MODERATE (*)    Ketones, ur 5 (*)    Protein, ur 30 (*)    Leukocytes,Ua TRACE (*)    All other components within normal limits  CBC  TROPONIN I (HIGH SENSITIVITY)  TROPONIN I (HIGH SENSITIVITY)   ____________________________________________  EKG  ED ECG REPORT I, Arta Silence, the attending physician, personally  viewed and interpreted this ECG.  Date: 01/01/2020 EKG Time: 1100 Rate: 99 Rhythm: Atrial fibrillation QRS Axis: normal Intervals: normal ST/T Wave abnormalities: normal Narrative Interpretation: no evidence of acute ischemia  ____________________________________________  RADIOLOGY  CXR: No focal infiltrate or other acute abnormality ____________________________________________   PROCEDURES  Procedure(s) performed: No  Procedures  Critical Care performed: No ____________________________________________   INITIAL IMPRESSION / ASSESSMENT AND PLAN / ED COURSE  Pertinent labs & imaging results that were available during my care of the patient were reviewed by me and considered in my medical decision making (see chart for details).  69 year old male with PMH as noted above including atrial fibrillation and Parkinson's disease presents with generalized weakness over the last several days which is gradual in onset, and associated with some generalized discomfort but no focal pain.  The patient was triaged as chest pain, but he specifically denies any acute chest pain.  She does report some chronic shortness  of breath which is unchanged.  I reviewed the past medical records in [place]. The patient was most recently admitted. in 2019 with generalized weakness and elevated troponin.  On exam today, he is overall relatively well-appearing.  His vital signs are normal.  He has tremor consistent with Parkinson's.  The physical exam is otherwise unremarkable.  Differential includes dehydration, other metabolic etiology, UTI or other action, or less likely cardiac cause.  We will obtain a lab work-up, give a fluid bolus, and reassess.  ----------------------------------------- 3:40 PM on 01/01/2020 -----------------------------------------  The labs are unremarkable including troponins x2.  The patient's urinalysis does show some RBCs and protein, but no evidence to suggest infection.   His vital signs have remained stable, and he has no active symptoms at this time.  He states he feels well after small fluid bolus.  At this time, the patient is stable for discharge home.  I counseled him on the results of the work-up.  He feels comfortable and is eager to be discharged.  I instructed him to follow-up with his PMD and I gave him thorough return precautions; he expressed understanding.  ____________________________________________   FINAL CLINICAL IMPRESSION(S) / ED DIAGNOSES  Final diagnoses:  Generalized weakness      NEW MEDICATIONS STARTED DURING THIS VISIT:  New Prescriptions   No medications on file     Note:  This document was prepared using Dragon voice recognition software and may include unintentional dictation errors.    Arta Silence, MD 01/01/20 [phone]

## 2020-01-01 NOTE — ED Notes (Signed)
E-signature not working at this time. Pt verbalized understanding of D/C instructions, prescriptions and follow up care with no further questions at this time. Pt in NAD waiting for cousin, [place]ian Co, to pick patient up from ED.

## 2020-01-01 NOTE — ED Notes (Signed)
This RN called [facility] for pt ride home. [name] informed this RN that Douglas Silva should be called for pt transportation. Dicky Shingler called and will be picking pt up from ED.

## 2020-01-01 NOTE — ED Triage Notes (Signed)
Pt comes into the ED via EMS from home with c/o intermittent episodes of "heart discomfort" since yesterday, with a generalized feeling of unwell..denies pain just not feeling well. Pt is a/ox4 in NAD.

## 2020-01-01 NOTE — Discharge Instructions (Addendum)
Continue to take your medications as prescribed, and follow-up with your regular doctor.  Return to the ER immediately for new, worsening, or persistent severe weakness, chest pain, difficulty breathing, fever, or any other new or worsening symptoms that concern you.

## 2020-04-03 ENCOUNTER — Emergency Department: Payer: Medicare Other

## 2020-04-03 ENCOUNTER — Other Ambulatory Visit: Payer: Self-pay

## 2020-04-03 ENCOUNTER — Other Ambulatory Visit: Payer: Medicare Other

## 2020-04-03 ENCOUNTER — Encounter: Payer: Self-pay | Admitting: Emergency Medicine

## 2020-04-03 ENCOUNTER — Inpatient Hospital Stay
Admission: EM | Admit: 2020-04-03 | Discharge: 2020-04-09 | DRG: 312 | Disposition: A | Discharge status: Skilled Nursing Facility | Payer: Medicare Other | Attending: Internal Medicine | Admitting: Internal Medicine

## 2020-04-03 DIAGNOSIS — R296 Repeated falls: Secondary | ICD-10-CM | POA: Diagnosis present

## 2020-04-03 DIAGNOSIS — Z800 Family history of malignant neoplasm of digestive organs: Secondary | ICD-10-CM

## 2020-04-03 DIAGNOSIS — Z20822 Contact with and (suspected) exposure to covid-19: Secondary | ICD-10-CM | POA: Diagnosis present

## 2020-04-03 DIAGNOSIS — M79602 Pain in left arm: Secondary | ICD-10-CM | POA: Diagnosis present

## 2020-04-03 DIAGNOSIS — Z993 Dependence on wheelchair: Secondary | ICD-10-CM

## 2020-04-03 DIAGNOSIS — I951 Orthostatic hypotension: Principal | ICD-10-CM | POA: Diagnosis present

## 2020-04-03 DIAGNOSIS — E860 Dehydration: Secondary | ICD-10-CM | POA: Diagnosis present

## 2020-04-03 DIAGNOSIS — E039 Hypothyroidism, unspecified: Secondary | ICD-10-CM | POA: Diagnosis present

## 2020-04-03 DIAGNOSIS — G20 Parkinson's disease: Secondary | ICD-10-CM | POA: Diagnosis present

## 2020-04-03 DIAGNOSIS — Z79890 Hormone replacement therapy (postmenopausal): Secondary | ICD-10-CM

## 2020-04-03 DIAGNOSIS — M79601 Pain in right arm: Secondary | ICD-10-CM | POA: Diagnosis present

## 2020-04-03 DIAGNOSIS — Z79899 Other long term (current) drug therapy: Secondary | ICD-10-CM

## 2020-04-03 DIAGNOSIS — Z7401 Bed confinement status: Secondary | ICD-10-CM

## 2020-04-03 DIAGNOSIS — R55 Syncope and collapse: Secondary | ICD-10-CM | POA: Diagnosis not present

## 2020-04-03 DIAGNOSIS — I5022 Chronic systolic (congestive) heart failure: Secondary | ICD-10-CM | POA: Diagnosis present

## 2020-04-03 DIAGNOSIS — I2510 Atherosclerotic heart disease of native coronary artery without angina pectoris: Secondary | ICD-10-CM | POA: Diagnosis present

## 2020-04-03 DIAGNOSIS — Z7901 Long term (current) use of anticoagulants: Secondary | ICD-10-CM

## 2020-04-03 DIAGNOSIS — I4891 Unspecified atrial fibrillation: Secondary | ICD-10-CM | POA: Diagnosis not present

## 2020-04-03 DIAGNOSIS — I429 Cardiomyopathy, unspecified: Secondary | ICD-10-CM | POA: Diagnosis present

## 2020-04-03 DIAGNOSIS — I110 Hypertensive heart disease with heart failure: Secondary | ICD-10-CM | POA: Diagnosis present

## 2020-04-03 DIAGNOSIS — Z801 Family history of malignant neoplasm of trachea, bronchus and lung: Secondary | ICD-10-CM

## 2020-04-03 DIAGNOSIS — N390 Urinary tract infection, site not specified: Secondary | ICD-10-CM | POA: Diagnosis present

## 2020-04-03 DIAGNOSIS — G909 Disorder of the autonomic nervous system, unspecified: Secondary | ICD-10-CM | POA: Diagnosis present

## 2020-04-03 DIAGNOSIS — B957 Other staphylococcus as the cause of diseases classified elsewhere: Secondary | ICD-10-CM | POA: Diagnosis present

## 2020-04-03 DIAGNOSIS — N400 Enlarged prostate without lower urinary tract symptoms: Secondary | ICD-10-CM | POA: Diagnosis present

## 2020-04-03 LAB — BASIC METABOLIC PANEL
Anion gap: 11 (ref 5–15)
BUN: 28 mg/dL — ABNORMAL HIGH (ref 8–23)
CO2: 26 mmol/L (ref 22–32)
Calcium: 9 mg/dL (ref 8.9–10.3)
Chloride: 101 mmol/L (ref 98–111)
Creatinine, Ser: 1.09 mg/dL (ref 0.61–1.24)
GFR calc Af Amer: >60 mL/min (ref >60)
GFR calc non Af Amer: >60 mL/min (ref >60)
Glucose, Bld: 129 mg/dL — ABNORMAL HIGH (ref 70–99)
Potassium: 4.1 mmol/L (ref 3.5–5.1)
Sodium: 138 mmol/L (ref 135–145)

## 2020-04-03 LAB — CBC
HCT: 42.2 % (ref 39.0–52.0)
Hemoglobin: 14.1 g/dL (ref 13.0–17.0)
MCH: 30.6 pg (ref 26.0–34.0)
MCHC: 33.4 g/dL (ref 30.0–36.0)
MCV: 91.5 fL (ref 80.0–100.0)
Platelets: 242 10*3/uL (ref 150–400)
RBC: 4.61 MIL/uL (ref 4.22–5.81)
RDW: 13.6 % (ref 11.5–15.5)
WBC: 12.5 10*3/uL — ABNORMAL HIGH (ref 4.0–10.5)
nRBC: 0 % (ref 0.0–0.2)

## 2020-04-03 LAB — PROTIME-INR
INR: 3.3 — ABNORMAL HIGH (ref 0.8–1.2)
Prothrombin Time: 32.2 seconds — ABNORMAL HIGH (ref 11.4–15.2)

## 2020-04-03 LAB — TROPONIN I (HIGH SENSITIVITY): Troponin I (High Sensitivity): 7 ng/L (ref <18)

## 2020-04-03 MED ORDER — DONEPEZIL HCL 5 MG PO TABS
10.0000 mg | ORAL_TABLET | Freq: Every day | ORAL | Status: DC
  Administered 2020-04-04 – 2020-04-09 (×6): 10 mg via ORAL
  Filled 2020-04-03 (×6): qty 2

## 2020-04-03 MED ORDER — SODIUM CHLORIDE 0.9 % IV SOLN: Freq: Once | INTRAVENOUS | Status: AC

## 2020-04-03 MED ORDER — ACETAMINOPHEN 325 MG PO TABS
650.0000 mg | ORAL_TABLET | Freq: Four times a day (QID) | ORAL | Status: DC
  Administered 2020-04-04 (×3): 650 mg via ORAL
  Filled 2020-04-03 (×3): qty 2

## 2020-04-03 MED ORDER — ACETAMINOPHEN 325 MG PO TABS
650.0000 mg | ORAL_TABLET | Freq: Four times a day (QID) | ORAL | Status: DC | PRN
  Administered 2020-04-05 – 2020-04-09 (×6): 650 mg via ORAL
  Filled 2020-04-03 (×6): qty 2

## 2020-04-03 MED ORDER — WARFARIN SODIUM 7.5 MG PO TABS: 7.5000 mg | ORAL_TABLET | Freq: Once | ORAL | Status: DC

## 2020-04-03 MED ORDER — DILTIAZEM HCL 25 MG/5ML IV SOLN
10.0000 mg | Freq: Once | INTRAVENOUS | Status: AC
  Administered 2020-04-03: 10 mg via INTRAVENOUS
  Filled 2020-04-03: qty 5

## 2020-04-03 MED ORDER — ONDANSETRON HCL 4 MG/2ML IJ SOLN: 4.0000 mg | Freq: Four times a day (QID) | INTRAMUSCULAR | Status: DC | PRN

## 2020-04-03 MED ORDER — SODIUM CHLORIDE 0.9% FLUSH: 3.0000 mL | Freq: Once | INTRAVENOUS | Status: DC

## 2020-04-03 MED ORDER — METOPROLOL SUCCINATE ER 50 MG PO TB24
25.0000 mg | ORAL_TABLET | Freq: Every day | ORAL | Status: DC
  Administered 2020-04-04: 25 mg via ORAL
  Filled 2020-04-03: qty 1

## 2020-04-03 MED ORDER — SODIUM CHLORIDE 0.9% FLUSH
3.0000 mL | Freq: Two times a day (BID) | INTRAVENOUS | Status: DC
  Administered 2020-04-04 – 2020-04-08 (×8): 3 mL via INTRAVENOUS

## 2020-04-03 MED ORDER — ACETAMINOPHEN 650 MG RE SUPP: 650.0000 mg | Freq: Four times a day (QID) | RECTAL | Status: DC | PRN

## 2020-04-03 MED ORDER — CARBIDOPA-LEVODOPA 25-100 MG PO TABS
1.0000 | ORAL_TABLET | Freq: Four times a day (QID) | ORAL | Status: DC
  Administered 2020-04-04 – 2020-04-09 (×21): 1 via ORAL
  Filled 2020-04-03 (×26): qty 1

## 2020-04-03 MED ORDER — LEVOTHYROXINE SODIUM 100 MCG PO TABS
100.0000 ug | ORAL_TABLET | Freq: Every day | ORAL | Status: DC
  Administered 2020-04-04 – 2020-04-09 (×6): 100 ug via ORAL
  Filled 2020-04-03 (×2): qty 1
  Filled 2020-04-03: qty 2
  Filled 2020-04-03 (×2): qty 1
  Filled 2020-04-03: qty 4

## 2020-04-03 MED ORDER — IBUPROFEN 400 MG PO TABS
400.0000 mg | ORAL_TABLET | Freq: Three times a day (TID) | ORAL | Status: DC | PRN
  Administered 2020-04-04 – 2020-04-08 (×5): 400 mg via ORAL
  Filled 2020-04-03 (×5): qty 1

## 2020-04-03 MED ORDER — SODIUM CHLORIDE 0.9 % IV SOLN: INTRAVENOUS | Status: DC

## 2020-04-03 MED ORDER — ONDANSETRON HCL 4 MG PO TABS: 4.0000 mg | ORAL_TABLET | Freq: Four times a day (QID) | ORAL | Status: DC | PRN

## 2020-04-03 MED ORDER — SIMVASTATIN 20 MG PO TABS
20.0000 mg | ORAL_TABLET | Freq: Every day | ORAL | Status: DC
  Administered 2020-04-04 – 2020-04-08 (×6): 20 mg via ORAL
  Filled 2020-04-03 (×2): qty 1
  Filled 2020-04-03 (×2): qty 2
  Filled 2020-04-03 (×2): qty 1

## 2020-04-03 NOTE — ED Triage Notes (Signed)
Pt via ems from home after syncopal episode.  Pt states he has had similar episodes in the past. 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 Pt does not remember how he was feeling before passing out. Pt alert & oriented, nad noted. Hx parkinson's.

## 2020-04-03 NOTE — ED Triage Notes (Signed)
First RN Note: Pt presents to ED via [facility] with c/o syncopal episode. Per EMS pt has hx of orthostatic hypotension and was on meds until recently. Per EMS pt had home health aid with him when syncopal episode happened, is currently trying to get patient placed. Pt with hx of parkinson's. EMS reports pt A&O x4.   106/71 CBG 129 HR 107

## 2020-04-03 NOTE — H&P (Signed)
History and Physical    [name]. NTI:[id number] DOB: 10-05-1951 DOA: 04/03/2020  PCP: Remi Haggard, FNP  Patient coming from: home  I have personally briefly reviewed patient's old medical records in [place]  Chief Complaint: syncope  HPI: Douglas Silva. is a 69 y.o. male with medical history significant of atrial fibrillation, BPH, cardiomyopathy, coronary disease, hypertension, hypothyroidism, Parkinson's disease with history of autonomic dysfunction and orthostasis presents to ed s/p episode of syncope. Patient states over the last few months he has had increasing symptoms of near syncope on standing but has not had associated syncopal episode in the recent past. He denies any recent illness or symptoms of chest pain , sob, n/v/ abdominal pain prior to  Or after the episode. He currently has no complaints.  ON further ros he denies fever/chills/ recent uri signs or symptoms, HA or confusion or new focal weakness.    ED Course:  Vitals:   04/03/20 1333 04/03/20 1830  BP: 107/64 110/89  Pulse: 66 81  Resp: 18 19  Temp: 97.8 F (36.6 C)   SpO2: 96% 95%   Patient ed course complicated by another episode of syncope and at that time patient was note to be in afib with rvr.  Patient episode was short lived.  Patient was treated with cardizem bolus for afib with rvr with desired effect Patient was also give ivfs  CT head: noted to be negative  EKG: Atrial fibrillation with rapid ventricular response, rate is 133 bpm, nonspecific ST and T wave changes Labs: Wbc 12.5, hgb 14.1,  NA: 138,cr 1 bun 28 UA pending inr 3.3 CE negative Cxr: NAD  Review of Systems: As per HPI otherwise 10 point review of systems negative.   Past Medical History:  Diagnosis Date  . Atrial fibrillation ([place])   . BPH (benign prostatic hyperplasia)   . Cardiomyopathy ([place])   . Coronary artery disease   . Hypertension   . Hypothyroidism   . Parkinson's disease [facility])       Past Surgical History:  Procedure Laterality Date  . COLONOSCOPY N/A 04/10/2015   Procedure: COLONOSCOPY;  Surgeon: Manya Silvas, MD;  Location[facility] ENDOSCOPY;  Service: Endoscopy;  Laterality: N/A;  . CORONARY ANGIOPLASTY    . DIALYSIS/PERMA CATHETER INSERTION N/A 09/05/2017   Procedure: DIALYSIS/PERMA CATHETER INSERTION;  Surgeon: Algernon Huxley, MD;  Location: [place] CV LAB;  Service: Cardiovascular;  Laterality: N/A;     reports that he has never smoked. He has never used smokeless tobacco. He reports that he does not drink alcohol and does not use drugs.  Allergies  Allergen Reactions  . No Known Allergies     Family History  Problem Relation Age of Onset  . Lung cancer Mother   . Esophageal cancer Father     Prior to Admission medications   Medication Sig Start Date End Date Taking? Authorizing Provider  acetaminophen (TYLENOL) 325 MG tablet Take 650 mg by mouth every 6 (six) hours.   Yes [provider]  carbidopa-levodopa (SINEMET IR) 25-100 MG tablet Take 1 tablet by mouth 4 (four) times daily. 05/11/18   [provider]  donepezil (ARICEPT) 10 MG tablet Take 10 mg by mouth daily. 03/18/20   [provider]  ibuprofen (ADVIL,MOTRIN) 400 MG tablet Take 1 tablet (400 mg total) by mouth every 8 (eight) hours as needed. Patient taking differently: Take 400 mg by mouth every 8 (eight) hours as needed for mild pain  or moderate pain.  09/16/17   Fritzi Mandes, MD  levothyroxine (SYNTHROID, LEVOTHROID) 100 MCG tablet Take 100 mcg by mouth daily before breakfast.    [provider]  metoprolol succinate (TOPROL-XL) 25 MG 24 hr tablet Take 25 mg by mouth daily. 05/20/17   [provider]  potassium chloride (KLOR-CON) 10 MEQ tablet Take 10 mEq by mouth daily. 03/18/20   [provider]  simvastatin (ZOCOR) 20 MG tablet Take 1 tablet (20 mg total) by mouth daily. Patient taking differently: Take 20 mg by mouth at bedtime.   09/23/17   Fritzi Mandes, MD  warfarin (COUMADIN) 7.5 MG tablet Take 1 tablet (7.5 mg total) by mouth one time only at 6 PM. Can take 7.5 mg of Coumadin today, tomorrow, needs to check PT[facility] at [facility] and adjust the dose of Coumadin according to the INR level. 10/17/18   Epifanio Lesches, MD    Physical Exam: Vitals:   04/03/20 1333 04/03/20 1334 04/03/20 1830 04/03/20 2235  BP: 107/64  110/89 (!) 144/80  Pulse: 66  81 (!) 120  Resp: 18  19 20   Temp: 97.8 F (36.6 C)     TempSrc: Oral     SpO2: 96%  95% 95%  Weight:  79.4 kg    Height:  6' (1.829 m)      Constitutional: NAD, calm, comfortable Vitals:   04/03/20 1333 04/03/20 1334 04/03/20 1830 04/03/20 2235  BP: 107/64  110/89 (!) 144/80  Pulse: 66  81 (!) 120  Resp: 18  19 20   Temp: 97.8 F (36.6 C)     TempSrc: Oral     SpO2: 96%  95% 95%  Weight:  79.4 kg    Height:  6' (1.829 m)     Eyes: PERRL, lids and conjunctivae normal ENMT: Mucous membranes are moist. Posterior pharynx clear of any exudate or lesions.Normal dentition.  Neck: normal, supple, no masses, no thyromegaly Respiratory: clear to auscultation bilaterally, no wheezing, no crackles. Normal respiratory effort. No accessory muscle use.  Cardiovascular: irregular rate and rhythm, no murmurs / rubs / gallops. No extremity edema. 2+ pedal pulses. No carotid bruits.  Abdomen: no tenderness, no masses palpated. No hepatosplenomegaly. Bowel sounds positive.  Musculoskeletal: no clubbing / cyanosis. No joint deformity upper and lower extremities. Good ROM, no contractures. Normal muscle tone.  Skin: no rashes, lesions, ulcers. No induration Neurologic: CN 2-12 grossly intact. Sensation intact, Strength 5/5 in all 4. + tremor Psychiatric: Normal judgment and insight. Alert and oriented x 3. Normal mood.    Labs on Admission: I have personally reviewed following labs and imaging studies  CBC: Recent Labs  Lab 04/03/20 1340  WBC 12.5*  HGB 14.1  HCT  42.2  MCV 91.5  PLT 272   Basic Metabolic Panel: Recent Labs  Lab 04/03/20 1340  NA 138  K 4.1  CL 101  CO2 26  GLUCOSE 129*  BUN 28*  CREATININE 1.09  CALCIUM 9.0   GFR: Estimated Creatinine Clearance: 72.2 mL/min (by C-G formula based on SCr of 1.09 mg/dL). Liver Function Tests: No results for input(s): AST, ALT, ALKPHOS, BILITOT, PROT, ALBUMIN in the last 168 hours. No results for input(s): LIPASE, AMYLASE in the last 168 hours. No results for input(s): AMMONIA in the last 168 hours. Coagulation Profile: Recent Labs  Lab 04/03/20 1930  INR 3.3*   Cardiac Enzymes: No results for input(s): CKTOTAL, CKMB, CKMBINDEX, TROPONINI in the last 168 hours. BNP (last 3 results) No results for  input(s): PROBNP in the last 8760 hours. HbA1C: No results for input(s): HGBA1C in the last 72 hours. CBG: No results for input(s): GLUCAP in the last 168 hours. Lipid Profile: No results for input(s): CHOL, HDL, LDLCALC, TRIG, CHOLHDL, LDLDIRECT in the last 72 hours. Thyroid Function Tests: No results for input(s): TSH, T4TOTAL, FREET4, T3FREE, THYROIDAB in the last 72 hours. Anemia Panel: No results for input(s): VITAMINB12, FOLATE, FERRITIN, TIBC, IRON, RETICCTPCT in the last 72 hours. Urine analysis:    Component Value Date/Time   COLORURINE AMBER (A) 01/01/2020 1306   APPEARANCEUR CLEAR (A) 01/01/2020 1306   LABSPEC 1.021 01/01/2020 1306   PHURINE 5.0 01/01/2020 1306   GLUCOSEU NEGATIVE 01/01/2020 1306   HGBUR MODERATE (A) 01/01/2020 1306   BILIRUBINUR NEGATIVE 01/01/2020 1306   KETONESUR 5 (A) 01/01/2020 1306   PROTEINUR 30 (A) 01/01/2020 1306   NITRITE NEGATIVE 01/01/2020 1306   LEUKOCYTESUR TRACE (A) 01/01/2020 1306    Radiological Exams on Admission: DG Chest 1 View  Result Date: 04/03/2020 CLINICAL DATA:  69 year old male with weakness, syncope. EXAM: CHEST  1 VIEW COMPARISON:  Chest radiographs 01/01/2020 and earlier. FINDINGS: Portable AP upright view at 2204  hours. Lung volumes and mediastinal contours are stable and within normal limits. Visualized tracheal air column is within normal limits. Mild chronic pulmonary interstitial markings appear stable - including in the right mid lung since 2019. Otherwise Allowing for portable technique the lungs are clear. No pneumothorax. Negative visible bowel gas pattern. No acute osseous abnormality identified. IMPRESSION: No acute cardiopulmonary abnormality. Electronically Signed   By: Genevie Ann M.D.   On: 04/03/2020 22:11   CT HEAD WO CONTRAST  Result Date: 04/03/2020 CLINICAL DATA:  Parkinson's disease, syncope, abnormal EKG EXAM: CT HEAD WITHOUT CONTRAST TECHNIQUE: Contiguous axial images were obtained from the base of the skull through the vertex without intravenous contrast. COMPARISON:  10/11/2018 FINDINGS: Brain: No acute infarct or hemorrhage. Lateral ventricles and midline structures are unremarkable. No acute extra-axial fluid collections. No mass effect. Vascular: No hyperdense vessel or unexpected calcification. Skull: Normal. Negative for fracture or focal lesion. Sinuses/Orbits: No acute finding. Other: None. IMPRESSION: 1. Stable head CT, no acute process. Electronically Signed   By: Randa Ngo M.D.   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 -ivfs overnight  -repeat orthostatic vs in am  -compression stockings when out of bed    Afib with rvr  -usually well rate controlled per cardiology notes - possible dehydration/urinary retention or infection causing uncontrolled rate  -cardizem bolus noted to be effective  -resume  metoprolol  -pharmacy to dose warfarin inr 3.3 -consider cardiology consult in am if CVR not maintained due to complication of titrating medications in setting of orthostasis/autonomic dysfunction  Leukocytosis  -possible infection , however cxr negative, ua bland  -will send cultures, check inflammatory markers  -no antibiotics at this time, monitor patient status if noted signs of systemic infection would have low threshold for starting abx    BPH --concern for urinary retention -place foley   CAD/CMY  -past echo results not available  -no signs of acute exacerbation -f/u echo in am   Parkinson's  disease  -resume home medications   Hypothyroidism -resume synthroid  FEN Electrolytes stable  Replete prn   DVT prophylaxis:Code Status: FULL Family Communication: n./a Disposition Plan: 1-2 days  Consults called: n/a ( consider cardiology consult to assist with med titration Admission status: obs   Clance Boll MD [facility]  If 7PM-7AM, please contact night-coverage [email or web address] Password [facility]  04/03/2020, 11:24 PM

## 2020-04-03 NOTE — ED Provider Notes (Signed)
ER Provider Note       Time seen: 7:22 PM    I have reviewed the vital signs and the nursing notes.  HISTORY   Chief Complaint Loss of Consciousness   HPI Douglas Silva. is a 69 y.o. male with a history of atrial fibrillation, BPH, cardiomyopathy, coronary disease, hypertension, hypothyroidism, Parkinson's disease who presents today for syncope.  Patient states he had similar episodes in the past.  He has full-time home care and that one of the physical therapist was leaving and found him unconscious.  He does not remember the event.  Patient denies recent illness or other complaints.  Past Medical History:  Diagnosis Date  . Atrial fibrillation ([place])   . BPH (benign prostatic hyperplasia)   . Cardiomyopathy ([place])   . Coronary artery disease   . Hypertension   . Hypothyroidism   . Parkinson's disease [facility])     Past Surgical History:  Procedure Laterality Date  . COLONOSCOPY N/A 04/10/2015   Procedure: COLONOSCOPY;  Surgeon: Manya Silvas, MD;  Location: [facility] ENDOSCOPY;  Service: Endoscopy;  Laterality: N/A;  . CORONARY ANGIOPLASTY    . DIALYSIS/PERMA CATHETER INSERTION N/A 09/05/2017   Procedure: DIALYSIS/PERMA CATHETER INSERTION;  Surgeon: Algernon Huxley, MD;  Location: [place] CV LAB;  Service: Cardiovascular;  Laterality: N/A;    Allergies Patient has no known allergies.  Review of Systems Constitutional: Negative for fever. Cardiovascular: Negative for chest pain.  Positive for syncope Respiratory: Negative for shortness of breath. Gastrointestinal: Negative for abdominal pain, vomiting and diarrhea. Musculoskeletal: Negative for back pain. Skin: Negative for rash. Neurological: Negative for headaches, focal weakness or numbness.  All systems negative/normal/unremarkable except as stated in the HPI  ____________________________________________   PHYSICAL EXAM:  VITAL SIGNS: Vitals:   04/03/20 1333 04/03/20 1830  BP: 107/64 110/89  Pulse:  66 81  Resp: 18 19  Temp: 97.8 F (36.6 C)   SpO2: 96% 95%    Constitutional: Alert and oriented. Well appearing and in no distress. Eyes: Conjunctivae are normal. Normal extraocular movements. ENT      Head: Normocephalic and atraumatic.      Nose: No congestion/rhinnorhea.      Mouth/Throat: Mucous membranes are moist.      Neck: No stridor. Cardiovascular: Rapid rate, irregular rhythm. No murmurs, rubs, or gallops. Respiratory: Normal respiratory effort without tachypnea nor retractions. Breath sounds are clear and equal bilaterally. No wheezes/rales/rhonchi. Gastrointestinal: Soft and nontender. Normal bowel sounds Musculoskeletal: Nontender with normal range of motion in extremities. No lower extremity tenderness nor edema. Neurologic:   No gross focal neurologic deficits are appreciated.  Resting tremors noted Skin:  Skin is warm, dry and intact. No rash noted. Psychiatric: Speech and behavior are normal.  ____________________________________________  EKG: Interpreted by me.  Atrial fibrillation with rapid ventricular response, rate is 133 bpm, nonspecific ST and T wave changes, normal QT  ____________________________________________   LABS (pertinent positives/negatives)  Labs Reviewed  BASIC METABOLIC PANEL - Abnormal; Notable for the following components:      Result Value   Glucose, Bld 129 (*)    BUN 28 (*)    All other components within normal limits  CBC - Abnormal; Notable for the following components:   WBC 12.5 (*)    All other components within normal limits  PROTIME-INR - Abnormal; Notable for the following components:   Prothrombin Time 32.2 (*)    INR 3.3 (*)    All other components within normal limits  URINALYSIS,  COMPLETE (UACMP) WITH MICROSCOPIC  CBG MONITORING, ED  TROPONIN I (HIGH SENSITIVITY)    DIFFERENTIAL DIAGNOSIS  Syncope, autonomic dysfunction, Parkinson's disease, dehydration, electrolyte abnormality, seizures,  arrhythmia  ASSESSMENT AND PLAN  Syncope, rapid atrial fibrillation, Parkinson's disease   Plan: The patient had presented for syncope. Patient's labs were initially unremarkable other than mild leukocytosis.  Patient did have another syncopal event here in which was profound.  He was also in rapid atrial fibrillation requiring IV Cardizem which improved his rate control some.  Syncope is probably related to Parkinson's disease but I will discuss with the hospitalist for observation and cardiology consultation.  Lenise Arena MD    Note: This note was generated in part or whole with voice recognition software. Voice recognition is usually quite accurate but there are transcription errors that can and very often do occur. I apologize for any typographical errors that were not detected and corrected.     Earleen Newport, MD 04/03/20 2101

## 2020-04-04 ENCOUNTER — Observation Stay
Admit: 2020-04-04 | Discharge: 2020-04-04 | Disposition: A | Discharge status: Home / Self Care | Payer: Medicare Other | Attending: Internal Medicine | Admitting: Internal Medicine

## 2020-04-04 DIAGNOSIS — R55 Syncope and collapse: Secondary | ICD-10-CM

## 2020-04-04 DIAGNOSIS — G20A1 Parkinson's disease without dyskinesia, without mention of fluctuations: Secondary | ICD-10-CM | POA: Diagnosis present

## 2020-04-04 DIAGNOSIS — G20 Parkinson's disease: Secondary | ICD-10-CM | POA: Diagnosis present

## 2020-04-04 DIAGNOSIS — I4891 Unspecified atrial fibrillation: Secondary | ICD-10-CM

## 2020-04-04 LAB — URINALYSIS, COMPLETE (UACMP) WITH MICROSCOPIC
Bilirubin Urine: NEGATIVE
Glucose, UA: NEGATIVE mg/dL
Hgb urine dipstick: NEGATIVE
Ketones, ur: 20 mg/dL — AB
Nitrite: NEGATIVE
Protein, ur: NEGATIVE mg/dL
Specific Gravity, Urine: 1.014 (ref 1.005–1.030)
Squamous Epithelial / HPF: NONE SEEN (ref 0–5)
pH: 5 (ref 5.0–8.0)

## 2020-04-04 LAB — COMPREHENSIVE METABOLIC PANEL
ALT: 7 U/L (ref 0–44)
AST: 14 U/L — ABNORMAL LOW (ref 15–41)
Albumin: 3.1 g/dL — ABNORMAL LOW (ref 3.5–5.0)
Alkaline Phosphatase: 52 U/L (ref 38–126)
Anion gap: 5 (ref 5–15)
BUN: 26 mg/dL — ABNORMAL HIGH (ref 8–23)
CO2: 29 mmol/L (ref 22–32)
Calcium: 8.2 mg/dL — ABNORMAL LOW (ref 8.9–10.3)
Chloride: 100 mmol/L (ref 98–111)
Creatinine, Ser: 0.93 mg/dL (ref 0.61–1.24)
GFR calc Af Amer: >60 mL/min (ref >60)
GFR calc non Af Amer: >60 mL/min (ref >60)
Glucose, Bld: 88 mg/dL (ref 70–99)
Potassium: 3.4 mmol/L — ABNORMAL LOW (ref 3.5–5.1)
Sodium: 134 mmol/L — ABNORMAL LOW (ref 135–145)
Total Bilirubin: 1.5 mg/dL — ABNORMAL HIGH (ref 0.3–1.2)
Total Protein: 6 g/dL — ABNORMAL LOW (ref 6.5–8.1)

## 2020-04-04 LAB — CBC
HCT: 35.1 % — ABNORMAL LOW (ref 39.0–52.0)
Hemoglobin: 11.6 g/dL — ABNORMAL LOW (ref 13.0–17.0)
MCH: 31 pg (ref 26.0–34.0)
MCHC: 33 g/dL (ref 30.0–36.0)
MCV: 93.9 fL (ref 80.0–100.0)
Platelets: 201 10*3/uL (ref 150–400)
RBC: 3.74 MIL/uL — ABNORMAL LOW (ref 4.22–5.81)
RDW: 13.6 % (ref 11.5–15.5)
WBC: 10.9 10*3/uL — ABNORMAL HIGH (ref 4.0–10.5)
nRBC: 0 % (ref 0.0–0.2)

## 2020-04-04 LAB — ECHOCARDIOGRAM COMPLETE
Height: 72 in
Weight: 2800 oz

## 2020-04-04 LAB — PROTIME-INR
INR: 3.5 — ABNORMAL HIGH (ref 0.8–1.2)
Prothrombin Time: 34.3 seconds — ABNORMAL HIGH (ref 11.4–15.2)

## 2020-04-04 LAB — TSH: TSH: 1.276 u[IU]/mL (ref 0.350–4.500)

## 2020-04-04 LAB — HEMOGLOBIN A1C
Hgb A1c MFr Bld: 5.9 % — ABNORMAL HIGH (ref 4.8–5.6)
Mean Plasma Glucose: 122.63 mg/dL

## 2020-04-04 LAB — TROPONIN I (HIGH SENSITIVITY): Troponin I (High Sensitivity): 6 ng/L (ref <18)

## 2020-04-04 MED ORDER — WARFARIN - PHARMACIST DOSING INPATIENT
Freq: Every day | Status: DC
  Filled 2020-04-04: qty 1

## 2020-04-04 MED ORDER — METOPROLOL SUCCINATE ER 50 MG PO TB24: 50.0000 mg | ORAL_TABLET | Freq: Every day | ORAL | 1 refills | Status: DC

## 2020-04-04 MED ORDER — IBUPROFEN 400 MG PO TABS: 400.0000 mg | ORAL_TABLET | Freq: Three times a day (TID) | ORAL | 0 refills | Status: DC | PRN

## 2020-04-04 MED ORDER — METOPROLOL SUCCINATE ER 50 MG PO TB24
25.0000 mg | ORAL_TABLET | Freq: Once | ORAL | Status: AC
  Administered 2020-04-04: 25 mg via ORAL
  Filled 2020-04-04: qty 1

## 2020-04-04 MED ORDER — METOPROLOL SUCCINATE ER 50 MG PO TB24
50.0000 mg | ORAL_TABLET | Freq: Every day | ORAL | Status: DC
  Administered 2020-04-05: 12:00:00 50 mg via ORAL
  Filled 2020-04-04: qty 1

## 2020-04-04 MED ORDER — WARFARIN SODIUM 2.5 MG PO TABS
2.5000 mg | ORAL_TABLET | Freq: Once | ORAL | Status: AC
  Administered 2020-04-04: 2.5 mg via ORAL
  Filled 2020-04-04: qty 1

## 2020-04-04 NOTE — Progress Notes (Signed)
*  PRELIMINARY RESULTS* Echocardiogram 2D Echocardiogram has been performed.  Sherrie Sport 04/04/2020, 4:28 PM

## 2020-04-04 NOTE — ED Notes (Signed)
Pt cleaned up and changed. New male purewick reapplied. Linens changed and pt repositioned for comfort.

## 2020-04-04 NOTE — ED Notes (Signed)
Home health aid states that she has concerns about taking pt home as he is not at his baseline and that she does feel like he can walk as well as normal.  Also has questions regarding ECHO and why pt had syncopal episode yesterday.  Message sent to Dr. Reesa Chew that home health aid needed to speak with her.  Return message to "just discharge him, he needs to follow up with cardiology and neurology.  Home health aid provided walker, pt disconnected from monitors, aid to attempt to ambulate pt to see how he does prior to d/c.

## 2020-04-04 NOTE — ED Notes (Signed)
Home health aid states pt was unable to stand with walker, pt unable to ambulate in room and she is not comfortable taking this pt home.  Message sent to Dr. Reesa Chew at this time.  Awaiting response.

## 2020-04-04 NOTE — ED Notes (Signed)
ECHO to bedside to perform testing, will attempt ambulation on completion of beside ECHO.

## 2020-04-04 NOTE — Consult Note (Addendum)
ANTICOAGULATION CONSULT NOTE - Initial Consult  Pharmacy Consult for Warfarin Indication: atrial fibrillation  Allergies  Allergen Reactions  . No Known Allergies     Patient Measurements: Height: 6' (182.9 cm) Weight: 79.4 kg (175 lb) IBW/kg (Calculated) : 77.6  Vital Signs: Temp: 98 F (36.7 C) (06/11 0805) Temp Source: Oral (06/11 0805) BP: 112/65 (06/11 0553) Pulse Rate: 98 (06/11 0553)  Labs: Recent Labs    04/03/20 1340 04/03/20 1930 04/04/20 0130 04/04/20 0601  HGB 14.1  --   --  11.6*  HCT 42.2  --   --  35.1*  PLT 242  --   --  201  LABPROT  --  32.2*  --   --   INR  --  3.3*  --   --   CREATININE 1.09  --   --  0.93  TROPONINIHS  --  7 6  --     Estimated Creatinine Clearance: 84.6 mL/min (by C-G formula based on SCr of 0.93 mg/dL).   Medical History: Past Medical History:  Diagnosis Date  . Atrial fibrillation ([place])   . BPH (benign prostatic hyperplasia)   . Cardiomyopathy ([place])   . Coronary artery disease   . Hypertension   . Hypothyroidism   . Parkinson's disease ([place])     Medications:  Med rec not currently complete, but care everywhere showing a 5mg  daily warfarin dose from most recent cardiology visit.  Assessment: 69 year old male with BPH, cardiomyopathy, coronary disease, hypertension, hypothyroidism, Parkinson's diseasewith history of autonomic dysfunction, orthostasis, and a fib on warfarin therapy presenting with an INR of 3.3, now 3.5 (slightly supratherapeutic) being seen for syncope.    Pharmacy has been consulted to monitor/adjust warfarin dosing.  Goal of Therapy:  INR 2-3 Monitor platelets by anticoagulation protocol: Yes   Plan:  Will start with a dose half of patient home dose of 2.5mg   Lu Duffel, PharmD, BCPS Clinical Pharmacist 04/04/2020 8:14 AM

## 2020-04-04 NOTE — Discharge Summary (Signed)
Physician Discharge Summary  Douglas Silva. OEV:[id number] DOB: 1951/03/27 DOA: 04/03/2020  PCP: Remi Haggard, FNP  Admit date: 04/03/2020 Discharge date: 04/04/2020  Admitted From: Home Disposition: Home  Recommendations for Outpatient Follow-up:  1. Follow up with PCP in 1-2 weeks 2. Follow up with your neurologist. 3. Follow up with your cardiologist. 4. Please obtain BMP/CBC in one week 5. Please follow up on the following pending results:Echo, urinary cultures.  Home Health:yes-resume services. Equipment/Devices: Environmental consultant, wheelchair Discharge Condition: Fair CODE STATUS: Full Diet recommendation: Heart Healthy  Brief/Interim Summary: Douglas Silva. is a 69 y.o. male with medical history significant of atrial fibrillation, BPH, cardiomyopathy, coronary disease, hypertension, hypothyroidism, Parkinson's diseasewith history of autonomic dysfunction and orthostasis presents to ed s/p episode of syncope. Patient states over the last few months he has had increasing symptoms of near syncope on standing but has not had associated syncopal episode in the recent past.  No urinary symptoms, headaches or new focal weakness. She most likely have worsening of his autonomic dysfunction secondary to Parkinson's disease.  His orthostatic vitals with markedly elevated blood pressure and heart rate with change in position from sitting to standing.  He was given some IV fluid in ED.  His home dose of metoprolol was increased from 25 to 50 mg daily and Lasix was discontinued.  He will need a close follow-up with his PCP and neurologist. Patient lives alone with 24/7 care provided by multiple aids.  Basically bedbound, can take few steps with walker, mostly uses wheelchair.  On presentation he was found to be in A. fib with RVR.  Per cardiology note he is mostly rate controlled.  Home dose of metoprolol was increased and he will continue with his Coumadin.  Had mild leukocytosis on  presentation, most likely secondary to recent stress.  No urinary symptoms.  UA is not very impressive for infection and urinary cultures are pending.  Remained afebrile.  Patient will continue rest of his home meds and needs a close follow-up with his cardiologist and neurologist.  Discharge Diagnoses:  Active Problems:   Syncope and collapse   Discharge Instructions  Discharge Instructions    Diet - low sodium heart healthy   Complete by: As directed    Discharge instructions   Complete by: As directed    It was pleasure taking care of you. I am increasing the dose of metoprolol from 25 to 50 mg daily. Taking Lasix and follow-up with your primary care physician and neurologist for further recommendations.   Increase activity slowly   Complete by: As directed      Allergies as of 04/04/2020      Reactions   No Known Allergies       Medication List    STOP taking these medications   furosemide 20 MG tablet Commonly known as: LASIX   potassium chloride 10 MEQ tablet Commonly known as: KLOR-CON     TAKE these medications   acetaminophen 325 MG tablet Commonly known as: TYLENOL Take 650 mg by mouth every 6 (six) hours.   carbidopa-levodopa 25-100 MG tablet Commonly known as: SINEMET IR Take 1 tablet by mouth every 3 (three) hours.   donepezil 10 MG tablet Commonly known as: ARICEPT Take 10 mg by mouth daily.   ibuprofen 400 MG tablet Commonly known as: ADVIL Take 1 tablet (400 mg total) by mouth every 8 (eight) hours as needed for mild pain or moderate pain.   levothyroxine 88 MCG tablet Commonly known  as: SYNTHROID Take 88 mcg by mouth daily before breakfast.   metoprolol succinate 50 MG 24 hr tablet Commonly known as: TOPROL-XL Take 1 tablet (50 mg total) by mouth daily. Take with or immediately following a meal. Start taking on: April 05, 2020 What changed:   medication strength  how much to take  additional instructions   simvastatin 20 MG  tablet Commonly known as: ZOCOR Take 1 tablet (20 mg total) by mouth daily. What changed: when to take this   Vitamin D (Ergocalciferol) 1.25 MG (50000 UNIT) Caps capsule Commonly known as: DRISDOL Take 50,000 Units by mouth every 7 (seven) days.   warfarin 5 MG tablet Commonly known as: COUMADIN Take 5 mg by mouth daily.       Follow-up Information    Remi Haggard, FNP. Schedule an appointment as soon as possible for a visit.   Specialty: Family Medicine Contact information: [address] [place] [address] [phone]              Allergies  Allergen Reactions  . No Known Allergies     Consultations:  None  Procedures/Studies: DG Chest 1 View  Result Date: 04/03/2020 CLINICAL DATA:  69 year old male with weakness, syncope. EXAM: CHEST  1 VIEW COMPARISON:  Chest radiographs 01/01/2020 and earlier. FINDINGS: Portable AP upright view at 2204 hours. Lung volumes and mediastinal contours are stable and within normal limits. Visualized tracheal air column is within normal limits. Mild chronic pulmonary interstitial markings appear stable - including in the right mid lung since 2019. Otherwise Allowing for portable technique the lungs are clear. No pneumothorax. Negative visible bowel gas pattern. No acute osseous abnormality identified. IMPRESSION: No acute cardiopulmonary abnormality. Electronically Signed   By: Genevie Ann M.D.   On: 04/03/2020 22:11   CT HEAD WO CONTRAST  Result Date: 04/03/2020 CLINICAL DATA:  Parkinson's disease, syncope, abnormal EKG EXAM: CT HEAD WITHOUT CONTRAST TECHNIQUE: Contiguous axial images were obtained from the base of the skull through the vertex without intravenous contrast. COMPARISON:  10/11/2018 FINDINGS: Brain: No acute infarct or hemorrhage. Lateral ventricles and midline structures are unremarkable. No acute extra-axial fluid collections. No mass effect. Vascular: No hyperdense vessel or unexpected calcification. Skull:  Normal. Negative for fracture or focal lesion. Sinuses/Orbits: No acute finding. Other: None. IMPRESSION: 1. Stable head CT, no acute process. Electronically Signed   By: Randa Ngo M.D.   On: 04/03/2020 21:52     Subjective: Per patient he always become dizzy with change in posture.  Denies any chest pain or shortness of breath.  His caregiver was present in the room and according to her he is very unsteady on his feet.  Discharge Exam: Vitals:   04/04/20 0805 04/04/20 1000  BP:  126/78  Pulse:  81  Resp:    Temp: 98 F (36.7 C)   SpO2:  100%   Vitals:   04/04/20 0700 04/04/20 0800 04/04/20 0805 04/04/20 1000  BP: 110/83 124/77  126/78  Pulse: 88 95  81  Resp:      Temp:   98 F (36.7 C)   TempSrc:   Oral   SpO2: 100% 100%  100%  Weight:      Height:        General: Pt is alert, awake, not in acute distress Cardiovascular: RRR, S1/S2 +, no rubs, no gallops Respiratory: CTA bilaterally, no wheezing, no rhonchi Abdominal: Soft, NT, ND, bowel sounds + Extremities: no edema, no cyanosis   The results of  significant diagnostics from this hospitalization (including imaging, microbiology, ancillary and laboratory) are listed below for reference.    Microbiology: No results found for this or any previous visit (from the past 240 hour(s)).   Labs: BNP (last 3 results) No results for input(s): BNP in the last 8760 hours. Basic Metabolic Panel: Recent Labs  Lab 04/03/20 1340 04/04/20 0601  NA 138 134*  K 4.1 3.4*  CL 101 100  CO2 26 29  GLUCOSE 129* 88  BUN 28* 26*  CREATININE 1.09 0.93  CALCIUM 9.0 8.2*   Liver Function Tests: Recent Labs  Lab 04/04/20 0601  AST 14*  ALT 7  ALKPHOS 52  BILITOT 1.5*  PROT 6.0*  ALBUMIN 3.1*   No results for input(s): LIPASE, AMYLASE in the last 168 hours. No results for input(s): AMMONIA in the last 168 hours. CBC: Recent Labs  Lab 04/03/20 1340 04/04/20 0601  WBC 12.5* 10.9*  HGB 14.1 11.6*  HCT 42.2 35.1*   MCV 91.5 93.9  PLT 242 201   Cardiac Enzymes: No results for input(s): CKTOTAL, CKMB, CKMBINDEX, TROPONINI in the last 168 hours. BNP: Invalid input(s): POCBNP CBG: No results for input(s): GLUCAP in the last 168 hours. D-Dimer No results for input(s): DDIMER in the last 72 hours. Hgb A1c Recent Labs    04/03/20 1340  HGBA1C 5.9*   Lipid Profile No results for input(s): CHOL, HDL, LDLCALC, TRIG, CHOLHDL, LDLDIRECT in the last 72 hours. Thyroid function studies Recent Labs    04/03/20 1340  TSH 1.276   Anemia work up No results for input(s): VITAMINB12, FOLATE, FERRITIN, TIBC, IRON, RETICCTPCT in the last 72 hours. Urinalysis    Component Value Date/Time   COLORURINE YELLOW (A) 04/04/2020 0601   APPEARANCEUR CLEAR (A) 04/04/2020 0601   LABSPEC 1.014 04/04/2020 0601   PHURINE 5.0 04/04/2020 0601   GLUCOSEU NEGATIVE 04/04/2020 0601   HGBUR NEGATIVE 04/04/2020 0601   BILIRUBINUR NEGATIVE 04/04/2020 0601   KETONESUR 20 (A) 04/04/2020 0601   PROTEINUR NEGATIVE 04/04/2020 0601   NITRITE NEGATIVE 04/04/2020 0601   LEUKOCYTESUR TRACE (A) 04/04/2020 0601   Sepsis Labs Invalid input(s): PROCALCITONIN,  WBC,  LACTICIDVEN Microbiology No results found for this or any previous visit (from the past 240 hour(s)).  Time coordinating discharge: Over 30 minutes  SIGNED:  Lorella Nimrod, MD  [facility] 04/04/2020, 2:44 PM  If 7PM-7AM, please contact night-coverage [email or web address]  This record has been created using Systems analyst. Errors have been sought and corrected,but may not always be located. Such creation errors do not reflect on the standard of care.

## 2020-04-04 NOTE — ED Notes (Signed)
Contacted pt home health company. Will send pt aide per pt request.

## 2020-04-04 NOTE — ED Notes (Signed)
Pt resting comfortably, watching TV. Caregiver at bedside. NAD noted, VSS, pt denies pain or further needs at this time.

## 2020-04-05 DIAGNOSIS — G20 Parkinson's disease: Secondary | ICD-10-CM | POA: Diagnosis present

## 2020-04-05 DIAGNOSIS — E860 Dehydration: Secondary | ICD-10-CM | POA: Diagnosis present

## 2020-04-05 DIAGNOSIS — I4891 Unspecified atrial fibrillation: Secondary | ICD-10-CM | POA: Diagnosis not present

## 2020-04-05 DIAGNOSIS — Z800 Family history of malignant neoplasm of digestive organs: Secondary | ICD-10-CM | POA: Diagnosis not present

## 2020-04-05 DIAGNOSIS — Z7401 Bed confinement status: Secondary | ICD-10-CM | POA: Diagnosis not present

## 2020-04-05 DIAGNOSIS — I951 Orthostatic hypotension: Secondary | ICD-10-CM | POA: Diagnosis present

## 2020-04-05 DIAGNOSIS — Z20822 Contact with and (suspected) exposure to covid-19: Secondary | ICD-10-CM | POA: Diagnosis present

## 2020-04-05 DIAGNOSIS — I2510 Atherosclerotic heart disease of native coronary artery without angina pectoris: Secondary | ICD-10-CM | POA: Diagnosis present

## 2020-04-05 DIAGNOSIS — G909 Disorder of the autonomic nervous system, unspecified: Secondary | ICD-10-CM | POA: Diagnosis present

## 2020-04-05 DIAGNOSIS — Z801 Family history of malignant neoplasm of trachea, bronchus and lung: Secondary | ICD-10-CM | POA: Diagnosis not present

## 2020-04-05 DIAGNOSIS — Z79890 Hormone replacement therapy (postmenopausal): Secondary | ICD-10-CM | POA: Diagnosis not present

## 2020-04-05 DIAGNOSIS — N390 Urinary tract infection, site not specified: Secondary | ICD-10-CM | POA: Diagnosis present

## 2020-04-05 DIAGNOSIS — M79601 Pain in right arm: Secondary | ICD-10-CM | POA: Diagnosis present

## 2020-04-05 DIAGNOSIS — R55 Syncope and collapse: Secondary | ICD-10-CM | POA: Diagnosis present

## 2020-04-05 DIAGNOSIS — I5022 Chronic systolic (congestive) heart failure: Secondary | ICD-10-CM | POA: Diagnosis present

## 2020-04-05 DIAGNOSIS — Z993 Dependence on wheelchair: Secondary | ICD-10-CM | POA: Diagnosis not present

## 2020-04-05 DIAGNOSIS — M79602 Pain in left arm: Secondary | ICD-10-CM | POA: Diagnosis present

## 2020-04-05 DIAGNOSIS — N400 Enlarged prostate without lower urinary tract symptoms: Secondary | ICD-10-CM | POA: Diagnosis present

## 2020-04-05 DIAGNOSIS — B957 Other staphylococcus as the cause of diseases classified elsewhere: Secondary | ICD-10-CM | POA: Diagnosis present

## 2020-04-05 DIAGNOSIS — R296 Repeated falls: Secondary | ICD-10-CM | POA: Diagnosis present

## 2020-04-05 DIAGNOSIS — I429 Cardiomyopathy, unspecified: Secondary | ICD-10-CM | POA: Diagnosis present

## 2020-04-05 DIAGNOSIS — I110 Hypertensive heart disease with heart failure: Secondary | ICD-10-CM | POA: Diagnosis present

## 2020-04-05 DIAGNOSIS — E039 Hypothyroidism, unspecified: Secondary | ICD-10-CM | POA: Diagnosis present

## 2020-04-05 DIAGNOSIS — Z79899 Other long term (current) drug therapy: Secondary | ICD-10-CM | POA: Diagnosis not present

## 2020-04-05 DIAGNOSIS — Z7901 Long term (current) use of anticoagulants: Secondary | ICD-10-CM | POA: Diagnosis not present

## 2020-04-05 LAB — CBC
HCT: 32.8 % — ABNORMAL LOW (ref 39.0–52.0)
Hemoglobin: 10.8 g/dL — ABNORMAL LOW (ref 13.0–17.0)
MCH: 31.1 pg (ref 26.0–34.0)
MCHC: 32.9 g/dL (ref 30.0–36.0)
MCV: 94.5 fL (ref 80.0–100.0)
Platelets: 200 10*3/uL (ref 150–400)
RBC: 3.47 MIL/uL — ABNORMAL LOW (ref 4.22–5.81)
RDW: 13.6 % (ref 11.5–15.5)
WBC: 10.2 10*3/uL (ref 4.0–10.5)
nRBC: 0 % (ref 0.0–0.2)

## 2020-04-05 LAB — SARS CORONAVIRUS 2 BY RT PCR (HOSPITAL ORDER, PERFORMED IN ~~LOC~~ HOSPITAL LAB): SARS Coronavirus 2: NEGATIVE

## 2020-04-05 LAB — PROTIME-INR
INR: 3.1 — ABNORMAL HIGH (ref 0.8–1.2)
Prothrombin Time: 31 seconds — ABNORMAL HIGH (ref 11.4–15.2)

## 2020-04-05 MED ORDER — SODIUM CHLORIDE 0.9 % IV SOLN: INTRAVENOUS | Status: DC

## 2020-04-05 MED ORDER — AMIODARONE HCL 200 MG PO TABS
200.0000 mg | ORAL_TABLET | Freq: Two times a day (BID) | ORAL | Status: DC
  Administered 2020-04-05 – 2020-04-09 (×9): 200 mg via ORAL
  Filled 2020-04-05 (×9): qty 1

## 2020-04-05 MED ORDER — WARFARIN SODIUM 2.5 MG PO TABS
2.5000 mg | ORAL_TABLET | Freq: Once | ORAL | Status: AC
  Administered 2020-04-05: 2.5 mg via ORAL
  Filled 2020-04-05: qty 1

## 2020-04-05 MED ORDER — METOPROLOL TARTRATE 25 MG PO TABS: 12.5000 mg | ORAL_TABLET | Freq: Two times a day (BID) | ORAL | Status: DC

## 2020-04-05 NOTE — ED Notes (Addendum)
Pt cleaned of incontinent episode, clean bed pad place. External cath removed per pt request. Pt given tylenol for pain, per MD order. Labs drawn and COVID swab collected per order. [place] Aide bedside.  Pt denies other needs at this time.

## 2020-04-05 NOTE — NC FL2 (Signed)
[place] LEVEL OF CARE SCREENING TOOL     IDENTIFICATION  Patient Name: Douglas Silva. Birthdate: 1951/09/05 Sex: male Admission Date (Current Location): 04/03/2020  [place] and [place] Number:  Engineering geologist and Address:  [facility], [address] [place], [place] [address]      Provider Number:    Attending Physician Name and Address:  Val Riles, MD  Relative Name and Phone Number:  Kandace Parkins [phone]    Current Level of Care: Hospital Recommended Level of Care: [place] Prior Approval Number:    Date Approved/Denied:   PASRR Number: pending  Discharge Plan: SNF    Current Diagnoses: Patient Active Problem List   Diagnosis Date Noted  . Syncope 04/05/2020  . Atrial fibrillation with rapid ventricular response ([place])   . Parkinson disease ([place])   . Syncope and collapse 04/03/2020  . Orthostatic hypotension 10/12/2018  . Hypotension 10/11/2018  . Sepsis ([place]) 06/01/2018  . AKI (acute kidney injury) ([place]) 09/03/2017  . Cellulitis 06/01/2017    Orientation RESPIRATION BLADDER Height & Weight     Self, Time, Situation, Place  Normal External catheter Weight: 175 lb (79.4 kg) Height:  6' (182.9 cm)  BEHAVIORAL SYMPTOMS/MOOD NEUROLOGICAL BOWEL NUTRITION STATUS      Continent Diet  AMBULATORY STATUS COMMUNICATION OF NEEDS Skin   Extensive Assist Verbally (with difficulty) Normal                       Personal Care Assistance Level of Assistance  Bathing, Dressing Bathing Assistance: Maximum assistance   Dressing Assistance: Maximum assistance     Functional Limitations Info  Sight, Hearing, Speech Sight Info: Adequate Hearing Info: Adequate Speech Info: Impaired    SPECIAL CARE FACTORS FREQUENCY  PT (By licensed PT), OT (By licensed OT)     PT Frequency: 5x per week OT Frequency: 5x per week            Contractures Contractures Info: Present    Additional  Factors Info  Code Status Code Status Info: full code             Current Medications (04/05/2020):  This is the current hospital active medication list Current Facility-Administered Medications  Medication Dose Route Frequency Provider Last Rate Last Admin  . 0.9 %  sodium chloride infusion   Intravenous Continuous Val Riles, MD      . acetaminophen (TYLENOL) tablet 650 mg  650 mg Oral Q6H PRN Clance Boll, MD   650 mg at 04/05/20 0501   Or  . acetaminophen (TYLENOL) suppository 650 mg  650 mg Rectal Q6H PRN Clance Boll, MD      . amiodarone (PACERONE) tablet 200 mg  200 mg Oral BID Val Riles, MD      . carbidopa-levodopa (SINEMET IR) 25-100 MG per tablet immediate release 1 tablet  1 tablet Oral QID Clance Boll, MD   1 tablet at 04/04/20 2138  . donepezil (ARICEPT) tablet 10 mg  10 mg Oral Daily Myles Rosenthal A, MD   10 mg at 04/05/20 1218  . ibuprofen (ADVIL) tablet 400 mg  400 mg Oral Q8H PRN Myles Rosenthal A, MD   400 mg at 04/04/20 1441  . levothyroxine (SYNTHROID) tablet 100 mcg  100 mcg Oral QAC breakfast Myles Rosenthal A, MD   100 mcg at 04/05/20 0501  . [START ON 04/06/2020] metoprolol tartrate (LOPRESSOR) tablet 12.5 mg  12.5 mg Oral  BID Val Riles, MD      . ondansetron [facility]) tablet 4 mg  4 mg Oral Q6H PRN Clance Boll, MD       Or  . ondansetron [facility]) injection 4 mg  4 mg Intravenous Q6H PRN Myles Rosenthal A, MD      . simvastatin (ZOCOR) tablet 20 mg  20 mg Oral QHS Myles Rosenthal A, MD   20 mg at 04/04/20 2138  . sodium chloride flush (NS) 0.9 % injection 3 mL  3 mL Intravenous Q12H Myles Rosenthal A, MD   3 mL at 04/05/20 1219  . warfarin (COUMADIN) tablet 2.5 mg  2.5 mg Oral ONCE-1600 Dallie Piles, RPH      . Warfarin - Pharmacist Dosing Inpatient   Does not apply q1600 Lu Duffel, [facility]   Given at 04/04/20 1737     Discharge Medications: Please see discharge summary for a list of discharge  medications.  Relevant Imaging Results:  Relevant Lab Results:   Additional Information [phone]  Elliot Gurney [place], [place]

## 2020-04-05 NOTE — TOC Initial Note (Signed)
Transition of Care (TOC) - Initial/Assessment Note    Patient Details  Name: Douglas Silva. MRN: [id number] Date of Birth: 07-24-1951  Transition of Care [facility]) CM/SW Contact:    Elliot Gurney [place], [place] Phone Number: 04/05/2020, 1:36 PM  Clinical Narrative:                 Patient is a 69 year old male who presented to the ED with syncope. Met with patient at bedside to discuss therapy recommendations. Patient's aid from [facility] care at bedside. Per patient, he would rather return home with his aids in place than go to short term rehab. Per patient's aid, they provide care to patient 22.5 hours per day-leaving a gap from 12:00-12:45pm. The aid called her supervisor during the visit who confirmed the hours provided, however will not be able to provide care to patient if he is total care. She confirmed that she would like to see patient go to short term rehab to get stronger. Patient's in home care will continue post rehab stay. Patient is agreeable to SNF work up, however would like to return home if at all possible with the current aid care in place. He has no SNF preference but does not want to return to [facility].  CSW will complete PASRR and Fl2 to be sent out for potential bed offers.  [place], LCSW Transitions of Care [phone]   Expected Discharge Plan: [place] (Patient currenlty has home health aids through [facility]) Barriers to Discharge: No Barriers Identified   Patient Goals and CMS Choice Patient states their goals for this hospitalization and ongoing recovery are:: "I want to avoid going to an assisted living or ion patient rehab"   Choice offered to / list presented to : Patient  Expected Discharge Plan and Services Expected Discharge Plan: [place] (Patient currenlty has home health aids through [facility]) In-house Referral: Clinical Social Work     Living arrangements for the  past 2 months: Single Family Home Expected Discharge Date: 04/04/20                                    Prior Living Arrangements/Services Living arrangements for the past 2 months: Single Family Home Lives with:: Self Patient language and need for interpreter reviewed:: Yes Do you feel safe going back to the place where you live?: Yes      Need for Family Participation in Patient Care: Yes (Comment) Care giver support system in place?: Yes (comment) Current home services:  ([facility]) Criminal Activity/Legal Involvement Pertinent to Current Situation/Hospitalization: No - Comment as needed  Activities of Daily Living Home Assistive Devices/Equipment: Environmental consultant (specify type) ADL Screening (condition at time of admission) Patient's cognitive ability adequate to safely complete daily activities?: Yes Is the patient deaf or have difficulty hearing?: No Does the patient have difficulty seeing, even when wearing glasses/contacts?: No Does the patient have difficulty concentrating, remembering, or making decisions?: No Patient able to express need for assistance with ADLs?: Yes Does the patient have difficulty dressing or bathing?: Yes Independently performs ADLs?: No Communication: Independent Dressing (OT): Needs assistance Is this a change from baseline?: Pre-admission baseline Grooming: Needs assistance Is this a change from baseline?: Pre-admission baseline Feeding: Needs assistance Is this a change from baseline?: Pre-admission baseline Bathing: Needs assistance Is this a change from baseline?: Change  from baseline, expected to last >3 days Toileting: Needs assistance Is this a change from baseline?: Change from baseline, expected to last <3 days In/Out Bed: Needs assistance Is this a change from baseline?: Pre-admission baseline Walks in Home: Needs assistance Is this a change from baseline?: Pre-admission baseline Does the patient have difficulty  walking or climbing stairs?: Yes Weakness of Legs: Both Weakness of Arms/Hands: None  Permission Sought/Granted   Permission granted to share information with : Yes, Release of Information Signed  Share Information with NAME: Zipporah Plants     Permission granted to share info w Relationship: sister  Permission granted to share info w Contact Information: [phone]  Emotional Assessment Appearance:: Appears stated age Attitude/Demeanor/Rapport: Engaged, Ambitious Affect (typically observed): Accepting, Adaptable Orientation: : Oriented to Self, Oriented to Place, Oriented to  Time, Oriented to Situation Alcohol / Substance Use: Not Applicable Psych Involvement: No (comment)  Admission diagnosis:  Syncope and collapse [R55] Parkinson disease (HCC) [G20] Syncope [R55] Atrial fibrillation with rapid ventricular response ([place]) [I48.91] Patient Active Problem List   Diagnosis Date Noted  . Syncope 04/05/2020  . Atrial fibrillation with rapid ventricular response ([place])   . Parkinson disease ([place])   . Syncope and collapse 04/03/2020  . Orthostatic hypotension 10/12/2018  . Hypotension 10/11/2018  . Sepsis ([place]) 06/01/2018  . AKI (acute kidney injury) ([place]) 09/03/2017  . Cellulitis 06/01/2017   PCP:  Remi Haggard, FNP Pharmacy:   [id number] [id number]: [phone]: [address] [address]Phone: [phone] Fax: [phone]    Social Determinants of Health (SDOH) Interventions    Readmission Risk Interventions No flowsheet data found.

## 2020-04-05 NOTE — Consult Note (Signed)
[place] for Warfarin Indication: atrial fibrillation  Patient Measurements: Height: 6' (182.9 cm) Weight: 79.4 kg (175 lb) IBW/kg (Calculated) : 77.6  Vital Signs: Temp: 97.7 F (36.5 C) (06/12 1028) Temp Source: Oral (06/12 1028) BP: 97/63 (06/12 1028) Pulse Rate: 70 (06/12 1028)  Labs: Recent Labs    04/03/20 1340 04/03/20 1340 04/03/20 1930 04/04/20 0130 04/04/20 0601 04/04/20 1203 04/05/20 0450  HGB 14.1   < >  --   --  11.6*  --  10.8*  HCT 42.2  --   --   --  35.1*  --  32.8*  PLT 242  --   --   --  201  --  200  LABPROT  --   --  32.2*  --   --  34.3* 31.0*  INR  --   --  3.3*  --   --  3.5* 3.1*  CREATININE 1.09  --   --   --  0.93  --   --   TROPONINIHS  --   --  7 6  --   --   --    < > = values in this interval not displayed.    Estimated Creatinine Clearance: 84.6 mL/min (by C-G formula based on SCr of 0.93 mg/dL).   Medical History: Past Medical History:  Diagnosis Date  . Atrial fibrillation ([place])   . BPH (benign prostatic hyperplasia)   . Cardiomyopathy ([place])   . Coronary artery disease   . Hypertension   . Hypothyroidism   . Parkinson's disease [facility])     Assessment: 69 year old male with BPH, cardiomyopathy, coronary disease, hypertension, hypothyroidism, Parkinson's diseasewith history of autonomic dysfunction, orthostasis, and a fib on warfarin therapy. Home dose is 5 mg daily. Pharmacy has been consulted to monitor/adjust warfarin dosing. No new DDIs since admission  Date   INR   Dose 6/10   3.3 6/11   3.5   2.5 mg  6/12   3.1   2.5 mg  Goal of Therapy:  INR 2-3 Monitor platelets by anticoagulation protocol: Yes   Plan:   INR slightly supratherapeutic but trending down: repeat warfarin 2.5mg  (1/2 of home dose)  Repeat INR in am to guide dosing  Dallie Piles, PharmD, BCPS Clinical Pharmacist 04/05/2020 11:55 AM

## 2020-04-05 NOTE — Consult Note (Signed)
Brief cardiology consult note Patient with orthostatic hypotension or tachycardia Plan is to reduce metoprolol and start amiodarone therapy with a load Will consider adding digoxin as well if patient needs further heart rate control with low blood pressure We will continue conservative therapy for now

## 2020-04-05 NOTE — Progress Notes (Signed)
Physical Therapy Evaluation Patient Details Name: Douglas Silva. MRN: [id number] DOB: 29-Oct-1950 Today's Date: 04/05/2020   History of Present Illness  per MD Note: Douglas Silva. is a 69 y.o. male with medical history significant of atrial fibrillation, BPH, cardiomyopathy, coronary disease, hypertension, hypothyroidism, Parkinson's disease with history of autonomic dysfunction and orthostasis presents to ed s/p episode of syncope. Patient states over the last few months he has had increasing symptoms of near syncope on standing but has not had associated syncopal episode in the recent past. He denies any recent illness or symptoms of chest pain , sob, n/v/ abdominal pain prior to  Or after the episode. He currently has no complaints.  ON further ros he denies fever/chills/ recent uri signs or symptoms, HA or confusion or new focal weakness.   Clinical Impression  Patient agrees to PT evaluation. He needs max assist for supine to sit bed mobility, and max assist for sit to stand with HHA. Patient has fair sitting balance and needs min assist for trunk support, standing balance is poor and needs HHA for static standing balance. Patient needs max assist for sit to supine bed mobility. Patient and Home care aide report that he was able to ambulate short distances with assist prior to hospital stay. He will need 24 hour assistance to return home or SNF would be appropriate.     Follow Up Recommendations SNF;Supervision/Assistance - 24 hour    Equipment Recommendations  None recommended by PT    Recommendations for Other Services OT consult     Precautions / Restrictions Restrictions Weight Bearing Restrictions: No      Mobility  Bed Mobility Overal bed mobility: Needs Assistance Bed Mobility: Supine to Sit;Sit to Supine     Supine to sit: Max assist Sit to supine: Max assist   General bed mobility comments:  (needs VC for safety)  Transfers Overall transfer level: Needs  assistance Equipment used: Rolling walker (2 wheeled) Transfers: Sit to/from Stand Sit to Stand: Max assist         General transfer comment:  (HHA and max assist)  Ambulation/Gait Ambulation/Gait assistance:  (unable)              Stairs            Wheelchair Mobility    Modified Rankin (Stroke Patients Only)       Balance Overall balance assessment: Needs assistance Sitting-balance support: Feet unsupported (min assist for trunk support) Sitting balance-Leahy Scale: Fair   Postural control: Posterior lean Standing balance support: Bilateral upper extremity supported Standing balance-Leahy Scale: Fair Standing balance comment:  (needs HHA, unable to hold RW)                             Pertinent Vitals/Pain Pain Assessment: No/denies pain    Home Living Family/patient expects to be discharged to:: Unsure                      Prior Function Level of Independence:  (minimal walking with assist 10-15 feet)               Hand Dominance        Extremity/Trunk Assessment   Upper Extremity Assessment Upper Extremity Assessment: Defer to OT evaluation    Lower Extremity Assessment Lower Extremity Assessment: Generalized weakness;RLE deficits/detail;LLE deficits/detail RLE Deficits / Details:  (2/5 hip flex, knee ext 2/5 ) LLE Deficits / Details:  (  hip flex 2/5, knee ext 2/5)       Communication   Communication: No difficulties  Cognition Arousal/Alertness: Awake/alert                                            General Comments      Exercises     Assessment/Plan    PT Assessment Patient needs continued PT services  PT Problem List Decreased strength;Decreased mobility       PT Treatment Interventions Gait training;Therapeutic activities;Therapeutic exercise    PT Goals (Current goals can be found in the Care Plan section)  Acute Rehab PT Goals Patient Stated Goal: no goals stated PT Goal  Formulation: Patient unable to participate in goal setting Time For Goal Achievement: 04/19/20 Potential to Achieve Goals: Fair    Frequency Min 2X/week   Barriers to discharge        Co-evaluation               AM-PAC PT "6 Clicks" Mobility  Outcome Measure Help needed turning from your back to your side while in a flat bed without using bedrails?: Total Help needed moving from lying on your back to sitting on the side of a flat bed without using bedrails?: Total Help needed moving to and from a bed to a chair (including a wheelchair)?: Total Help needed standing up from a chair using your arms (e.g., wheelchair or bedside chair)?: Total Help needed to walk in hospital room?: Total Help needed climbing 3-5 steps with a railing? : Total 6 Click Score: 6    End of Session Equipment Utilized During Treatment: Gait belt Activity Tolerance: Other (comment) (LE weakness and tremers in BUE) Patient left: in bed   PT Visit Diagnosis: Unsteadiness on feet (R26.81);Muscle weakness (generalized) (M62.81);Difficulty in walking, not elsewhere classified (R26.2)    Time: 1700-1749 PT Time Calculation (min) (ACUTE ONLY): 25 min   Charges:   PT Evaluation $PT Eval Low Complexity: 1 Low           Stephenson, Sherryl Barters, PT DPT 04/05/2020, 10:19 AM

## 2020-04-05 NOTE — Progress Notes (Signed)
[facility] Progress Note  Patient: Douglas Silva.    PZW:[id number]  DOA: 04/03/2020     Date of Service: the patient was seen and examined on 04/05/2020  Chief Complaint  Patient presents with  . Loss of Consciousness   Brief hospital course:  Douglas Silva. is a 69 y.o. male with medical history significant of atrial fibrillation, BPH, cardiomyopathy, coronary disease, hypertension, hypothyroidism, Parkinson's diseasewith history of autonomic dysfunction and orthostasis presents to ed s/p episode of syncope. Patient states over the last few months he has had increasing symptoms of near syncope on standing but has not had associated syncopal episode in the recent past. He denies any recent illness or symptoms of chest pain , sob, n/v/ abdominal pain prior to  Or after the episode. He currently has no complaints.  ON further ros he denies fever/chills/ recent uri signs or symptoms, HA or confusion or new focal weakness.    ED Course:      Vitals:   04/03/20 1333 04/03/20 1830  BP: 107/64 110/89  Pulse: 66 81  Resp: 18 19  Temp: 97.8 F (36.6 C)   SpO2: 96% 95%   Patient ed course complicated by another episode of syncope and at that time patient was note to be in afib with rvr.  Patient episode was short lived.  Patient was treated with cardizem bolus for afib with rvr with desired effect Patient was also give ivfs  CT head: noted to be negative  UMP:NTIRWE fibrillation with rapid ventricular response, rate is 133 bpm, nonspecific ST and T wave changes Labs: Wbc 12.5, hgb 14.1,  NA: 138,cr 1 bun 28 UA pending inr 3.3 CE negative Cxr: NAD    Assessment and Plan:  # Syncope and frequent falls secondary to orthostatic hypotension due to autonomic dysfunction/Parkinson's disease and patient is on Toprol-XL secondary to A. Fib w/RVR, and possible dehydration -Continue IV fluid NS 100 mL/h for hydration overnight -Repeat orthostatic BP -Continue fall  precautions -compression stockings when out of bed    # Afib with rvr  -usually well rate controlled per cardiology notes - possible dehydration/urinary retention or infection causing uncontrolled rate  -s/p cardizem bolus noted to be effective, but I would avoid Cardizem secondary to underlying CHF -6/12 started amiodarone 200 mg p.o. twice daily for 7 days and then decrease to amiodarone 200 mg p.o. daily -Discontinued Toprol-XL 50 mg, started Lopressor 12.5 mg p.o. twice daily with holding parameters, hold if SBP less than 140, to avoid orthostatics -pharmacy to dose warfarin inr 3.3 -consider cardiology consult in am if CVR not maintained due to complication of titrating medications in setting of orthostasis/autonomic dysfunction  # Leukocytosis  -possible infection , however cxr negative, ua bland  -will send cultures, check inflammatory markers  -no antibiotics at this time, monitor patient status if noted signs of systemic infection would have low threshold for starting abx    # BPH --concern for urinary retention -place foley   # CAD and chronic systolic CHF -past echo results not available  -no signs of acute exacerbation -TTE shows LVEF 35 to 40%, LV global hypokinesis, grade 1 diastolic dysfunction  # Parkinson's disease  -resume home medications   # Hypothyroidism -resume synthroid   Body mass index is 23.73 kg/m.  Interventions:      Diet: Heart healthy/carb modified DVT Prophylaxis: Therapeutic Anticoagulation with Warfarin   Advance goals of care discussion: Full code  Family Communication: caregiver  was present at bedside,  at the time of interview.  The pt provided permission to discuss medical plan with the family. Opportunity was given to ask question and all questions were answered satisfactorily.   Disposition:  Pt is from Home, admitted with syncope and frequent falls/orthostatic hypotension, still has low blood pressure, which  precludes a safe discharge. Discharge to Home , when blood pressure will improve and patient will no longer be orthostatics, patient is to ambulate before discharge without any symptoms..  Subjective: No overnight issues, patient was lying comfortably in the bed.  Patient is still having low blood pressure and unable to stand up and walk, patient is asymptomatic with orthostatics So plan is to consult cardiology and change medications as above.  Possible discharge tomorrow a.m.   Physical Exam: General:  alert oriented to time, place, and person.  Appear in mild distress, affect appropriate Eyes: PERRLA ENT: Oral Mucosa Clear, moist  Neck: no JVD,  Cardiovascular: S1 and S2 Present, no Murmur,  Respiratory: good respiratory effort, Bilateral Air entry equal and Decreased, no Crackles, no wheezes Abdomen: Bowel Sound present, Soft and no tenderness,  Skin: norashes Extremities: no Pedal edema, no calf tenderness Neurologic: without any new focal findings, resting tremors of bilateral hands and rigidity, patient has Parkinson's disease and symptoms Gait not checked due to patient safety concerns  Vitals:   04/05/20 0440 04/05/20 0559 04/05/20 0830 04/05/20 1028  BP: (!) 117/46  99/75 97/63  Pulse: 64  79 70  Resp: (!) 22  19 14   Temp:  98.2 F (36.8 C)  97.7 F (36.5 C)  TempSrc:  Oral  Oral  SpO2: 98%  100% 99%  Weight:      Height:       No intake or output data in the 24 hours ending 04/05/20 1406 Filed Weights   04/03/20 1334  Weight: 79.4 kg    Data Reviewed: I have personally reviewed and interpreted daily labs, tele strips, imagings as discussed above. I reviewed all nursing notes, pharmacy notes, vitals, pertinent old records I have discussed plan of care as described above with RN and patient/family.  CBC: Recent Labs  Lab 04/03/20 1340 04/04/20 0601 04/05/20 0450  WBC 12.5* 10.9* 10.2  HGB 14.1 11.6* 10.8*  HCT 42.2 35.1* 32.8*  MCV 91.5 93.9 94.5  PLT  242 201 762   Basic Metabolic Panel: Recent Labs  Lab 04/03/20 1340 04/04/20 0601  NA 138 134*  K 4.1 3.4*  CL 101 100  CO2 26 29  GLUCOSE 129* 88  BUN 28* 26*  CREATININE 1.09 0.93  CALCIUM 9.0 8.2*    Studies: ECHOCARDIOGRAM COMPLETE  Result Date: 04/04/2020    ECHOCARDIOGRAM REPORT   Patient Name:   Douglas Kretschmer. Date of Exam: 04/04/2020 Medical Rec #:  [id number]             Height:       72.0 in Accession #:    [id number]            Weight:       175.0 lb Date of Birth:  June 15, 1951             BSA:          2.013 m Patient Age:    20 years              BP:           137/106 mmHg Patient Gender: M  HR:           114 bpm. Exam Location:  [facility] Procedure: 2D Echo, Cardiac Doppler and Color Doppler Indications:     Syncope 780.2  History:         Patient has no prior history of Echocardiogram examinations.                  Cardiomyopathy, Arrythmias:Atrial Fibrillation; Risk                  Factors:Hypertension.  Sonographer:     Sherrie Sport RDCS (AE) Referring Phys:  [id number] [facility] AMIN Diagnosing Phys: Yolonda Kida MD IMPRESSIONS  1. Left ventricular ejection fraction, by estimation, is 35 to 40%. The left ventricle has moderately decreased function. The left ventricle demonstrates global hypokinesis. The left ventricular internal cavity size was moderately dilated. Left ventricular diastolic parameters are consistent with Grade I diastolic dysfunction (impaired relaxation).  2. Right ventricular systolic function is mildly reduced. The right ventricular size is mildly enlarged. There is normal pulmonary artery systolic pressure.  3. Right atrial size was moderately dilated.  4. The mitral valve is grossly normal. Trivial mitral valve regurgitation.  5. The aortic valve is abnormal. Aortic valve regurgitation is not visualized. FINDINGS  Left Ventricle: Left ventricular ejection fraction, by estimation, is 35 to 40%. The left ventricle has moderately decreased  function. The left ventricle demonstrates global hypokinesis. The left ventricular internal cavity size was moderately dilated. There is no left ventricular hypertrophy. Left ventricular diastolic parameters are consistent with Grade I diastolic dysfunction (impaired relaxation). Right Ventricle: The right ventricular size is mildly enlarged. No increase in right ventricular wall thickness. Right ventricular systolic function is mildly reduced. There is normal pulmonary artery systolic pressure. The tricuspid regurgitant velocity  is 2.29 m/s, and with an assumed right atrial pressure of 10 mmHg, the estimated right ventricular systolic pressure is 76.2 mmHg. Left Atrium: Left atrial size was normal in size. Right Atrium: Right atrial size was moderately dilated. Pericardium: There is no evidence of pericardial effusion. Mitral Valve: The mitral valve is grossly normal. Trivial mitral valve regurgitation. Tricuspid Valve: The tricuspid valve is grossly normal. Tricuspid valve regurgitation is mild. Aortic Valve: The aortic valve is abnormal. Aortic valve regurgitation is not visualized. Aortic valve mean gradient measures 1.5 mmHg. Aortic valve peak gradient measures 2.4 mmHg. Aortic valve area, by VTI measures 2.68 cm. Pulmonic Valve: The pulmonic valve was normal in structure. Pulmonic valve regurgitation is not visualized. Aorta: The aortic root is normal in size and structure. IAS/Shunts: No atrial level shunt detected by color flow Doppler.  LEFT VENTRICLE PLAX 2D LVIDd:         5.30 cm LVIDs:         4.37 cm LV PW:         1.53 cm LV IVS:        0.89 cm LVOT diam:     2.20 cm LV SV:         32 LV SV Index:   16 LVOT Area:     3.80 cm  RIGHT VENTRICLE RV Basal diam:  3.15 cm RV S prime:     13.50 cm/s TAPSE (M-mode): 3.3 cm LEFT ATRIUM            Index       RIGHT ATRIUM           Index LA diam:      4.00 cm  1.99 cm/m  RA  Area:     24.40 cm LA Vol (A2C): 57.0 ml  28.32 ml/m RA Volume:   70.10 ml  34.82  ml/m LA Vol (A4C): 121.0 ml 60.11 ml/m  AORTIC VALVE                   PULMONIC VALVE AV Area (Vmax):    2.45 cm    PV Vmax:        0.57 m/s AV Area (Vmean):   2.35 cm    PV Peak grad:   1.3 mmHg AV Area (VTI):     2.68 cm    RVOT Peak grad: 1 mmHg AV Vmax:           78.20 cm/s AV Vmean:          56.200 cm/s AV VTI:            0.118 m AV Peak Grad:      2.4 mmHg AV Mean Grad:      1.5 mmHg LVOT Vmax:         50.30 cm/s LVOT Vmean:        34.800 cm/s LVOT VTI:          0.084 m LVOT/AV VTI ratio: 0.70  AORTA Ao Root diam: 3.70 cm MITRAL VALVE               TRICUSPID VALVE MV Area (PHT): 3.97 cm    TR Peak grad:   21.0 mmHg MV Decel Time: 191 msec    TR Vmax:        229.00 cm/s MV E velocity: 65.60 cm/s                            SHUNTS                            Systemic VTI:  0.08 m                            Systemic Diam: 2.20 cm Dwayne Prince Rome MD Electronically signed by Yolonda Kida MD Signature Date/Time: 04/04/2020/4:47:00 PM    Final     Scheduled Meds: . amiodarone  200 mg Oral BID  . carbidopa-levodopa  1 tablet Oral QID  . donepezil  10 mg Oral Daily  . levothyroxine  100 mcg Oral QAC breakfast  . [START ON 04/06/2020] metoprolol tartrate  12.5 mg Oral BID  . simvastatin  20 mg Oral QHS  . sodium chloride flush  3 mL Intravenous Q12H  . warfarin  2.5 mg Oral ONCE-1600  . Warfarin - Pharmacist Dosing Inpatient   Does not apply q1600   Continuous Infusions: PRN Meds: acetaminophen **OR** acetaminophen, ibuprofen, ondansetron **OR** ondansetron (ZOFRAN) IV  Time spent: 35 minutes  Author: Val Riles. MD [facility] 04/05/2020 2:06 PM  To reach On-call, see care teams to locate the attending and reach out to them via [email or web address]. If 7PM-7AM, please contact night-coverage If you still have difficulty reaching the attending provider, please page the [facility] (Director on Call) for [facility] on amion for assistance.

## 2020-04-05 NOTE — ED Notes (Signed)
Pt urinated with male external cath, which leaked. Bed changed with fresh sheet and bed pads. Pt cleaned and dry, pt repositioned and comfortable.

## 2020-04-06 LAB — BASIC METABOLIC PANEL
Anion gap: 6 (ref 5–15)
BUN: 24 mg/dL — ABNORMAL HIGH (ref 8–23)
CO2: 28 mmol/L (ref 22–32)
Calcium: 8 mg/dL — ABNORMAL LOW (ref 8.9–10.3)
Chloride: 104 mmol/L (ref 98–111)
Creatinine, Ser: 0.92 mg/dL (ref 0.61–1.24)
GFR calc Af Amer: >60 mL/min (ref >60)
GFR calc non Af Amer: >60 mL/min (ref >60)
Glucose, Bld: 97 mg/dL (ref 70–99)
Potassium: 3.6 mmol/L (ref 3.5–5.1)
Sodium: 138 mmol/L (ref 135–145)

## 2020-04-06 LAB — CBC
HCT: 26.8 % — ABNORMAL LOW (ref 39.0–52.0)
Hemoglobin: 8.7 g/dL — ABNORMAL LOW (ref 13.0–17.0)
MCH: 31.2 pg (ref 26.0–34.0)
MCHC: 32.5 g/dL (ref 30.0–36.0)
MCV: 96.1 fL (ref 80.0–100.0)
Platelets: 198 10*3/uL (ref 150–400)
RBC: 2.79 MIL/uL — ABNORMAL LOW (ref 4.22–5.81)
RDW: 13.9 % (ref 11.5–15.5)
WBC: 9 10*3/uL (ref 4.0–10.5)
nRBC: 0 % (ref 0.0–0.2)

## 2020-04-06 LAB — URINE CULTURE: Culture: 80000 — AB

## 2020-04-06 LAB — PHOSPHORUS: Phosphorus: 2.6 mg/dL (ref 2.5–4.6)

## 2020-04-06 LAB — PROTIME-INR
INR: 2.3 — ABNORMAL HIGH (ref 0.8–1.2)
Prothrombin Time: 24.9 seconds — ABNORMAL HIGH (ref 11.4–15.2)

## 2020-04-06 LAB — MAGNESIUM: Magnesium: 1.8 mg/dL (ref 1.7–2.4)

## 2020-04-06 MED ORDER — DOXYCYCLINE HYCLATE 100 MG PO TABS
100.0000 mg | ORAL_TABLET | Freq: Two times a day (BID) | ORAL | Status: AC
  Administered 2020-04-06 – 2020-04-08 (×6): 100 mg via ORAL
  Filled 2020-04-06 (×6): qty 1

## 2020-04-06 MED ORDER — MAGNESIUM SULFATE 2 GM/50ML IV SOLN
2.0000 g | Freq: Once | INTRAVENOUS | Status: AC
  Administered 2020-04-06: 2 g via INTRAVENOUS
  Filled 2020-04-06: qty 50

## 2020-04-06 MED ORDER — MIDODRINE HCL 5 MG PO TABS
2.5000 mg | ORAL_TABLET | Freq: Three times a day (TID) | ORAL | Status: DC
  Administered 2020-04-06 – 2020-04-07 (×4): 2.5 mg via ORAL
  Filled 2020-04-06 (×6): qty 1

## 2020-04-06 MED ORDER — K PHOS MONO-SOD PHOS DI & MONO 155-852-130 MG PO TABS
250.0000 mg | ORAL_TABLET | Freq: Three times a day (TID) | ORAL | Status: AC
  Administered 2020-04-06 (×3): 250 mg via ORAL
  Filled 2020-04-06 (×3): qty 1

## 2020-04-06 MED ORDER — METOPROLOL TARTRATE 25 MG PO TABS
12.5000 mg | ORAL_TABLET | Freq: Two times a day (BID) | ORAL | Status: DC
  Administered 2020-04-07: 09:00:00 12.5 mg via ORAL
  Filled 2020-04-06 (×2): qty 1

## 2020-04-06 MED ORDER — SODIUM CHLORIDE 0.9 % IV SOLN: INTRAVENOUS | Status: DC

## 2020-04-06 MED ORDER — WARFARIN SODIUM 2.5 MG PO TABS
2.5000 mg | ORAL_TABLET | Freq: Once | ORAL | Status: AC
  Administered 2020-04-06: 18:00:00 2.5 mg via ORAL
  Filled 2020-04-06: qty 1

## 2020-04-06 NOTE — Progress Notes (Signed)
[facility] Progress Note  Patient: Douglas Silva.    ZOX:[id number]  DOA: 04/03/2020     Date of Service: the patient was seen and examined on 04/06/2020  Chief Complaint  Patient presents with  . Loss of Consciousness   Brief hospital course:  Muaaz Brau. is a 69 y.o. male with medical history significant of atrial fibrillation, BPH, cardiomyopathy, coronary disease, hypertension, hypothyroidism, Parkinson's diseasewith history of autonomic dysfunction and orthostasis presents to ed s/p episode of syncope. Patient states over the last few months he has had increasing symptoms of near syncope on standing but has not had associated syncopal episode in the recent past. He denies any recent illness or symptoms of chest pain , sob, n/v/ abdominal pain prior to  Or after the episode. He currently has no complaints.  ON further ros he denies fever/chills/ recent uri signs or symptoms, HA or confusion or new focal weakness.    ED Course:      Vitals:   04/03/20 1333 04/03/20 1830  BP: 107/64 110/89  Pulse: 66 81  Resp: 18 19  Temp: 97.8 F (36.6 C)   SpO2: 96% 95%   Patient ed course complicated by another episode of syncope and at that time patient was note to be in afib with rvr.  Patient episode was short lived.  Patient was treated with cardizem bolus for afib with rvr with desired effect Patient was also give ivfs  CT head: noted to be negative  WJX:BJYNWG fibrillation with rapid ventricular response, rate is 133 bpm, nonspecific ST and T wave changes Labs: Wbc 12.5, hgb 14.1,  NA: 138,cr 1 bun 28 UA pending inr 3.3 CE negative Cxr: NAD    Assessment and Plan:  # Syncope and frequent falls secondary to orthostatic hypotension due to autonomic dysfunction/Parkinson's disease and patient is on Toprol-XL secondary to A. Fib w/RVR, and possible dehydration -s/p IV fluid NS 100 mL/h for hydration overnight, d/c'd on 6/13 am -Repeat orthostatic  BP -Continue fall precautions -compression stockings when out of bed  -6/13 started midodrine 2.5 mg p.o. 3 times daily, hold if SBP >120 and or HR >100   # Afib with rvr  -usually well rate controlled per cardiology notes - possible dehydration/urinary retention or infection causing uncontrolled rate  -s/p cardizem bolus noted to be effective, but I would avoid Cardizem secondary to underlying CHF -6/12 started amiodarone 200 mg p.o. twice daily for 7 days and then decrease to amiodarone 200 mg p.o. daily -Discontinued Toprol-XL 50 mg, started Lopressor 12.5 mg p.o. twice daily with holding parameters, hold if SBP less than 140, to avoid orthostatics -pharmacy to dose warfarin inr 3.3 -cardiology consulted.  #UTI, patient had leukocytosis, urine culture ATK staph epidermidis , ua trace LE rare bacteria, not very impressive.  6/13 started doxycycline 100 mg p.o. twice daily for 3 days   # BPH, currently on voiding without any difficulty, condom catheter was placed No Active issues  # CAD and chronic systolic CHF -past echo results not available  -no signs of acute exacerbation -TTE shows LVEF 35 to 40%, LV global hypokinesis, grade 1 diastolic dysfunction  # Parkinson's disease  -resume home medications   # Hypothyroidism -resume synthroid   Body mass index is 23.73 kg/m.  Interventions:      Diet: Heart healthy/carb modified DVT Prophylaxis: Therapeutic Anticoagulation with Warfarin   Advance goals of care discussion: Full code  Family Communication: caregiver  was present at bedside, at the  time of interview.  The pt provided permission to discuss medical plan with the family. Opportunity was given to ask question and all questions were answered satisfactorily.   Disposition:  Pt is from Home, admitted with syncope and frequent falls/orthostatic hypotension, still has low blood pressure, which precludes a safe discharge. Discharge to Home , when blood  pressure will improve and patient will no longer be orthostatics, patient needs to ambulate before discharge without any symptoms..  Subjective: No overnight issues, patient was lying comfortably in the bed. C/o pain in the both arms secondary to persistent tremors due to Parkinson's disease.  Patient has no any other complaints and has not tried to get up and walk yet. RN/CNA was advised to check orthostatics today and ambulate patient so that we can plan to discharge him tomorrow a.m.    Physical Exam: General:  alert oriented to time, place, and person.  Appear in mild distress, affect appropriate Eyes: PERRLA ENT: Oral Mucosa Clear, moist  Neck: no JVD,  Cardiovascular: S1 and S2 Present, no Murmur,  Respiratory: good respiratory effort, Bilateral Air entry equal and Decreased, no Crackles, no wheezes Abdomen: Bowel Sound present, Soft and no tenderness,  Skin: norashes Extremities: no Pedal edema, no calf tenderness Neurologic: without any new focal findings, resting tremors of bilateral hands and rigidity, patient has Parkinson's disease and symptoms Gait not checked due to patient safety concerns  Vitals:   04/05/20 2332 04/06/20 0404 04/06/20 0459 04/06/20 0751  BP: (!) 106/55 128/83  126/72  Pulse: 100 94  70  Resp: (!) 24 (!) 24  18  Temp: (!) 97.5 F (36.4 C) 97.7 F (36.5 C)  98.9 F (37.2 C)  TempSrc: Oral Oral  Oral  SpO2: 98% 97%  99%  Weight:   79.5 kg   Height:        Intake/Output Summary (Last 24 hours) at 04/06/2020 1114 Last data filed at 04/06/2020 0500 Gross per 24 hour  Intake 1568.52 ml  Output 750 ml  Net 818.52 ml   Filed Weights   04/03/20 1334 04/06/20 0459  Weight: 79.4 kg 79.5 kg    Data Reviewed: I have personally reviewed and interpreted daily labs, tele strips, imagings as discussed above. I reviewed all nursing notes, pharmacy notes, vitals, pertinent old records I have discussed plan of care as described above with RN and  patient/family.  CBC: Recent Labs  Lab 04/03/20 1340 04/04/20 0601 04/05/20 0450 04/06/20 0435  WBC 12.5* 10.9* 10.2 9.0  HGB 14.1 11.6* 10.8* 8.7*  HCT 42.2 35.1* 32.8* 26.8*  MCV 91.5 93.9 94.5 96.1  PLT 242 201 200 098   Basic Metabolic Panel: Recent Labs  Lab 04/03/20 1340 04/04/20 0601 04/06/20 0435  NA 138 134* 138  K 4.1 3.4* 3.6  CL 101 100 104  CO2 26 29 28   GLUCOSE 129* 88 97  BUN 28* 26* 24*  CREATININE 1.09 0.93 0.92  CALCIUM 9.0 8.2* 8.0*  MG  --   --  1.8  PHOS  --   --  2.6    Studies: No results found.  Scheduled Meds: . amiodarone  200 mg Oral BID  . carbidopa-levodopa  1 tablet Oral QID  . donepezil  10 mg Oral Daily  . levothyroxine  100 mcg Oral QAC breakfast  . [START ON 04/07/2020] metoprolol tartrate  12.5 mg Oral BID  . midodrine  2.5 mg Oral TID WC  . phosphorus  250 mg Oral TID  . simvastatin  20 mg Oral QHS  . sodium chloride flush  3 mL Intravenous Q12H  . warfarin  2.5 mg Oral ONCE-1600  . Warfarin - Pharmacist Dosing Inpatient   Does not apply q1600   Continuous Infusions: . sodium chloride 100 mL/hr at 04/06/20 0500  . magnesium sulfate bolus IVPB     PRN Meds: acetaminophen **OR** acetaminophen, ibuprofen, ondansetron **OR** ondansetron (ZOFRAN) IV  Time spent: 35 minutes  Author: Val Riles. MD [facility] 04/06/2020 11:14 AM  To reach On-call, see care teams to locate the attending and reach out to them via [email or web address]. If 7PM-7AM, please contact night-coverage If you still have difficulty reaching the attending provider, please page the [facility] (Director on Call) for [facility] on amion for assistance.

## 2020-04-06 NOTE — Consult Note (Addendum)
[place] for Warfarin Indication: atrial fibrillation  Patient Measurements: Height: 6' (182.9 cm) Weight: 79.5 kg (175 lb 4.3 oz) IBW/kg (Calculated) : 77.6  Vital Signs: Temp: 98.9 F (37.2 C) (06/13 0751) Temp Source: Oral (06/13 0751) BP: 126/72 (06/13 0751) Pulse Rate: 70 (06/13 0751)  Labs: Recent Labs    04/03/20 1340 04/03/20 1340 04/03/20 1930 04/03/20 1930 04/04/20 0130 04/04/20 0601 04/04/20 0601 04/04/20 1203 04/05/20 0450 04/06/20 0435  HGB 14.1   < >  --   --   --  11.6*   < >  --  10.8* 8.7*  HCT 42.2   < >  --   --   --  35.1*  --   --  32.8* 26.8*  PLT 242   < >  --   --   --  201  --   --  200 198  LABPROT  --   --  32.2*   < >  --   --   --  34.3* 31.0* 24.9*  INR  --   --  3.3*   < >  --   --   --  3.5* 3.1* 2.3*  CREATININE 1.09  --   --   --   --  0.93  --   --   --  0.92  TROPONINIHS  --   --  7  --  6  --   --   --   --   --    < > = values in this interval not displayed.    Estimated Creatinine Clearance: 85.5 mL/min (by C-G formula based on SCr of 0.92 mg/dL).   Medical History: Past Medical History:  Diagnosis Date  . Atrial fibrillation ([place])   . BPH (benign prostatic hyperplasia)   . Cardiomyopathy ([place])   . Coronary artery disease   . Hypertension   . Hypothyroidism   . Parkinson's disease [facility])     Assessment: 69 year old male with BPH, cardiomyopathy, coronary disease, hypertension, hypothyroidism, Parkinson's diseasewith history of autonomic dysfunction, orthostasis, and a fib on warfarin therapy. Home dose is 5 mg daily. Pharmacy has been consulted to monitor/adjust warfarin dosing.   DDIs: amiodarone added 6/12  Date   INR   Dose 6/10   3.3 6/11   3.5   2.5 mg  6/12   3.1   2.5 mg 6/13   2.3   2.5 mg  Goal of Therapy:  INR 2-3 Monitor platelets by anticoagulation protocol: Yes   Plan:   INR is therapeutic: warfarin 2.5mg  (equal to 1/2 of home dose given new DDI)  H&H  trending down, platelets WNL and stable: continue to follow  Repeat INR in am to guide dosing  Dallie Piles, PharmD, BCPS Clinical Pharmacist 04/06/2020 8:55 AM

## 2020-04-07 ENCOUNTER — Ambulatory Visit: Admission: RE | Admit: 2020-04-07 | Payer: Medicare Other | Source: Home / Self Care | Admitting: Internal Medicine

## 2020-04-07 ENCOUNTER — Encounter
Admission: EM | Disposition: A | Discharge status: Skilled Nursing Facility | Payer: Self-pay | Source: Home / Self Care | Attending: Student

## 2020-04-07 LAB — CBC
HCT: 28.5 % — ABNORMAL LOW (ref 39.0–52.0)
Hemoglobin: 9.4 g/dL — ABNORMAL LOW (ref 13.0–17.0)
MCH: 31.1 pg (ref 26.0–34.0)
MCHC: 33 g/dL (ref 30.0–36.0)
MCV: 94.4 fL (ref 80.0–100.0)
Platelets: 216 10*3/uL (ref 150–400)
RBC: 3.02 MIL/uL — ABNORMAL LOW (ref 4.22–5.81)
RDW: 13.8 % (ref 11.5–15.5)
WBC: 10.9 10*3/uL — ABNORMAL HIGH (ref 4.0–10.5)
nRBC: 0 % (ref 0.0–0.2)

## 2020-04-07 LAB — PROTIME-INR
INR: 1.8 — ABNORMAL HIGH (ref 0.8–1.2)
Prothrombin Time: 20.2 seconds — ABNORMAL HIGH (ref 11.4–15.2)

## 2020-04-07 LAB — MAGNESIUM: Magnesium: 1.9 mg/dL (ref 1.7–2.4)

## 2020-04-07 LAB — BASIC METABOLIC PANEL
Anion gap: 6 (ref 5–15)
BUN: 23 mg/dL (ref 8–23)
CO2: 28 mmol/L (ref 22–32)
Calcium: 8.4 mg/dL — ABNORMAL LOW (ref 8.9–10.3)
Chloride: 106 mmol/L (ref 98–111)
Creatinine, Ser: 0.82 mg/dL (ref 0.61–1.24)
GFR calc Af Amer: >60 mL/min (ref >60)
GFR calc non Af Amer: >60 mL/min (ref >60)
Glucose, Bld: 96 mg/dL (ref 70–99)
Potassium: 3.9 mmol/L (ref 3.5–5.1)
Sodium: 140 mmol/L (ref 135–145)

## 2020-04-07 LAB — PHOSPHORUS: Phosphorus: 2.7 mg/dL (ref 2.5–4.6)

## 2020-04-07 SURGERY — COLONOSCOPY WITH PROPOFOL
Anesthesia: General

## 2020-04-07 MED ORDER — MIDODRINE HCL 5 MG PO TABS
5.0000 mg | ORAL_TABLET | Freq: Three times a day (TID) | ORAL | Status: DC
  Administered 2020-04-07 – 2020-04-09 (×4): 5 mg via ORAL
  Filled 2020-04-07 (×5): qty 1

## 2020-04-07 MED ORDER — BISACODYL 5 MG PO TBEC: 10.0000 mg | DELAYED_RELEASE_TABLET | Freq: Every day | ORAL | Status: DC | PRN

## 2020-04-07 MED ORDER — BISACODYL 10 MG RE SUPP: 10.0000 mg | Freq: Every day | RECTAL | Status: DC | PRN

## 2020-04-07 MED ORDER — WARFARIN SODIUM 5 MG PO TABS
5.0000 mg | ORAL_TABLET | Freq: Once | ORAL | Status: AC
  Administered 2020-04-07: 5 mg via ORAL
  Filled 2020-04-07: qty 1

## 2020-04-07 MED ORDER — POLYETHYLENE GLYCOL 3350 17 G PO PACK
17.0000 g | PACK | Freq: Every day | ORAL | Status: DC
  Administered 2020-04-07 – 2020-04-09 (×3): 17 g via ORAL
  Filled 2020-04-07 (×3): qty 1

## 2020-04-07 NOTE — TOC Progression Note (Signed)
Transition of Care (TOC) - Progression Note    Patient Details  Name: Quientin V Savidge Jr. MRN: [id number] Date of Birth: 04/11/1951  Transition of Care (TOC) CM/SW Contact   J , RN Phone Number: 04/07/2020, 9:23 AM  Clinical Narrative:    Met with the patient in the room, Cheryl (one of his home aides) is in the room and his sister was on the phone, I reviewed the bed choices with the patient and he chose [facility], He will work with PT today, He has not had vaccines for Covid, I notified Kelly with [facility] to hold the bed   Expected Discharge Plan: Skilled Nursing Facility (Patient currenlty has home health aids through [facility]) Barriers to Discharge: No Barriers Identified  Expected Discharge Plan and Services Expected Discharge Plan: Skilled Nursing Facility (Patient currenlty has home health aids through [facility]) In-house Referral: Clinical Social Work     Living arrangements for the past 2 months: Single Family Home Expected Discharge Date: 04/04/20                                     Social Determinants of Health (SDOH) Interventions    Readmission Risk Interventions No flowsheet data found.  

## 2020-04-07 NOTE — Progress Notes (Addendum)
Physical Therapy Treatment Patient Details Name: Douglas Silva. MRN: [id number] DOB: Dec 22, 1950 Today's Date: 04/07/2020    History of Present Illness per MD Note: Douglas Boys. is a 69 y.o. male with medical history significant of atrial fibrillation, BPH, cardiomyopathy, coronary disease, hypertension, hypothyroidism, Parkinson's disease with history of autonomic dysfunction and orthostasis presents to ed s/p episode of syncope. Patient states over the last few months he has had increasing symptoms of near syncope on standing but has not had associated syncopal episode in the recent past. He denies any recent illness or symptoms of chest pain , sob, n/v/ abdominal pain prior to  Or after the episode. He currently has no complaints.  ON further ros he denies fever/chills/ recent uri signs or symptoms, HA or confusion or new focal weakness.     PT Comments    Patient received in bed. Agreeable to PT session.  Caregiver present. Performed bed level exercises with assistance.  Performed supine to sit with max assist, sit to stand with mod +2. He was able to take a few steps along side of bed with hand held assist.  Patient had orthostatic hypotension with standing. He requires max assist for sit to supine and positioning in bed.  He will continue to benefit from skilled PT while here to improve strength and functional independence.        Follow Up Recommendations  SNF;Supervision/Assistance - 24 hour     Equipment Recommendations  None recommended by PT    Recommendations for Other Services       Precautions / Restrictions Precautions Precautions: Fall Restrictions Weight Bearing Restrictions: No    Mobility  Bed Mobility Overal bed mobility: Needs Assistance Bed Mobility: Supine to Sit;Sit to Supine     Supine to sit: Max assist Sit to supine: Max assist;+2 for physical assistance   General bed mobility comments: Requires assist for bringing legs off bed and to  raise trunk to seated position.  Transfers Overall transfer level: Needs assistance Equipment used: Rolling walker (2 wheeled) Transfers: Sit to/from Stand Sit to Stand: +2 physical assistance;Mod assist            Ambulation/Gait Ambulation/Gait assistance: Mod assist;+2 physical assistance Gait Distance (Feet): 4 Feet Assistive device: 2 person hand held assist Gait Pattern/deviations: Step-to pattern Gait velocity: decr   General Gait Details: patient able to side step up towards head of bed with +2 hand held assist.   Stairs             Wheelchair Mobility    Modified Rankin (Stroke Patients Only)       Balance Overall balance assessment: Needs assistance Sitting-balance support: Feet supported Sitting balance-Leahy Scale: Fair Sitting balance - Comments: patient will lose balance posteriorly at times Postural control: Posterior lean Standing balance support: Bilateral upper extremity supported;During functional activity Standing balance-Leahy Scale: Fair Standing balance comment: reliant on external assist                            Cognition Arousal/Alertness: Awake/alert Behavior During Therapy: WFL for tasks assessed/performed Overall Cognitive Status: Within Functional Limits for tasks assessed                                        Exercises Other Exercises Other Exercises: LE strengthening exercisesL to include: Ap, heel slides, hip abd, SLR  x 10 reps each. needs assist on left due to increased weakness and stiffness on left.    General Comments        Pertinent Vitals/Pain Pain Assessment: No/denies pain    Home Living                      Prior Function            PT Goals (current goals can now be found in the care plan section) Acute Rehab PT Goals Patient Stated Goal: no goals stated, caregiver feels he will need SNF PT Goal Formulation: Patient unable to participate in goal setting Time  For Goal Achievement: 04/19/20 Potential to Achieve Goals: Fair Progress towards PT goals: Progressing toward goals    Frequency    Min 2X/week      PT Plan Current plan remains appropriate    Co-evaluation              AM-PAC PT "6 Clicks" Mobility   Outcome Measure  Help needed turning from your back to your side while in a flat bed without using bedrails?: A Lot Help needed moving from lying on your back to sitting on the side of a flat bed without using bedrails?: A Lot Help needed moving to and from a bed to a chair (including a wheelchair)?: A Lot Help needed standing up from a chair using your arms (e.g., wheelchair or bedside chair)?: A Lot Help needed to walk in hospital room?: A Lot Help needed climbing 3-5 steps with a railing? : Total 6 Click Score: 11    End of Session Equipment Utilized During Treatment: Gait belt Activity Tolerance: Patient tolerated treatment well Patient left: in bed;with bed alarm set;with call bell/phone within reach;with family/visitor present Nurse Communication: Mobility status PT Visit Diagnosis: Unsteadiness on feet (R26.81);Muscle weakness (generalized) (M62.81);Difficulty in walking, not elsewhere classified (R26.2)     Time: 2336-1224 PT Time Calculation (min) (ACUTE ONLY): 22 min  Charges:  $Therapeutic Activity: 8-22 mins                     Kaydyn Sayas, PT, GCS 04/07/20,11:56 AM

## 2020-04-07 NOTE — Interval H&P Note (Deleted)
History and Physical Interval Note:  04/07/2020 8:32 AM  Douglas Silva.  has presented today for surgery, with the diagnosis of PH POLYPS.  The various methods of treatment have been discussed with the patient and family. After consideration of risks, benefits and other options for treatment, the patient has consented to  Procedure(s): COLONOSCOPY WITH PROPOFOL (N/A) as a surgical intervention.  The patient's history has been reviewed, patient examined, no change in status, stable for surgery.  I have reviewed the patient's chart and labs.  Questions were answered to the patient's satisfaction.     [place], [place]

## 2020-04-07 NOTE — Consult Note (Signed)
[place] for Warfarin Indication: atrial fibrillation  Patient Measurements: Height: 6' (182.9 cm) Weight: 79.5 kg (175 lb 4.3 oz) IBW/kg (Calculated) : 77.6  Vital Signs: Temp: 98.5 F (36.9 C) (06/14 0818) Temp Source: Axillary (06/14 0818) BP: 130/72 (06/14 0818) Pulse Rate: 73 (06/14 0341)  Labs: Recent Labs    04/05/20 0450 04/05/20 0450 04/06/20 0435 04/07/20 0702  HGB 10.8*   < > 8.7* 9.4*  HCT 32.8*  --  26.8* 28.5*  PLT 200  --  198 216  LABPROT 31.0*  --  24.9* 20.2*  INR 3.1*  --  2.3* 1.8*  CREATININE  --   --  0.92 0.82   < > = values in this interval not displayed.    Estimated Creatinine Clearance: 95.9 mL/min (by C-G formula based on SCr of 0.82 mg/dL).   Medical History: Past Medical History:  Diagnosis Date  . Atrial fibrillation ([place])   . BPH (benign prostatic hyperplasia)   . Cardiomyopathy ([place])   . Coronary artery disease   . Hypertension   . Hypothyroidism   . Parkinson's disease [facility])     Assessment: 69 year old male with BPH, cardiomyopathy, coronary disease, hypertension, hypothyroidism, Parkinson's diseasewith history of autonomic dysfunction, orthostasis, and a fib on warfarin therapy. Home dose is 5 mg daily. Pharmacy has been consulted to monitor/adjust warfarin dosing.   DDIs: amiodarone added 6/12  Date   INR   Dose 6/10   3.3 6/11   3.5   2.5 mg  6/12   3.1   2.5 mg 6/13   2.3   2.5 mg 6/14    1.8  Goal of Therapy:  INR 2-3 Monitor platelets by anticoagulation protocol: Yes   Plan:   INR is sub-therapeutic: warfarin 5 mg (equal to home dose given drop in INR)  H&H/platelets WNL and stable: continue to follow  Repeat INR in am to guide dosing  Lu Duffel, PharmD, BCPS Clinical Pharmacist 04/07/2020 8:24 AM

## 2020-04-07 NOTE — H&P (Deleted)
Outpatient short stay form Pre-procedure 04/07/2020 8:31 AM Douglas Silva Douglas Silva, M.D.  Primary Physician: Threasa Alpha, FNP  Reason for visit:  Personal hx of serrated adenoma, tubular adenoma x 2 - Colonoscpy - 2016.  History of present illness:                           Patient presents for colonoscopy for a personal hx of colon polyps. The patient denies abdominal pain, abnormal weight loss or rectal bleeding.      Current Facility-Administered Medications:  .  acetaminophen (TYLENOL) tablet 650 mg, 650 mg, Oral, Q6H PRN, 650 mg at 04/06/20 1843 **OR** acetaminophen (TYLENOL) suppository 650 mg, 650 mg, Rectal, Q6H PRN, Myles Rosenthal A, MD .  amiodarone (PACERONE) tablet 200 mg, 200 mg, Oral, BID, Val Riles, MD, 200 mg at 04/06/20 2151 .  carbidopa-levodopa (SINEMET IR) 25-100 MG per tablet immediate release 1 tablet, 1 tablet, Oral, QID, Clance Boll, MD, 1 tablet at 04/06/20 2152 .  donepezil (ARICEPT) tablet 10 mg, 10 mg, Oral, Daily, Myles Rosenthal A, MD, 10 mg at 04/06/20 0947 .  doxycycline (VIBRA-TABS) tablet 100 mg, 100 mg, Oral, Q12H, Val Riles, MD, 100 mg at 04/06/20 2152 .  ibuprofen (ADVIL) tablet 400 mg, 400 mg, Oral, Q8H PRN, Clance Boll, MD, 400 mg at 04/06/20 2157 .  levothyroxine (SYNTHROID) tablet 100 mcg, 100 mcg, Oral, QAC breakfast, Myles Rosenthal A, MD, 100 mcg at 04/07/20 [phone] .  metoprolol tartrate (LOPRESSOR) tablet 12.5 mg, 12.5 mg, Oral, BID, Val Riles, MD .  midodrine (PROAMATINE) tablet 2.5 mg, 2.5 mg, Oral, TID WC, Val Riles, MD, 2.5 mg at 04/06/20 1814 .  ondansetron (ZOFRAN) tablet 4 mg, 4 mg, Oral, Q6H PRN **OR** ondansetron (ZOFRAN) injection 4 mg, 4 mg, Intravenous, Q6H PRN, Myles Rosenthal A, MD .  simvastatin (ZOCOR) tablet 20 mg, 20 mg, Oral, QHS, Myles Rosenthal A, MD, 20 mg at 04/06/20 2152 .  sodium chloride flush (NS) 0.9 % injection 3 mL, 3 mL, Intravenous, Q12H, Myles Rosenthal A, MD, 3 mL at  04/06/20 2152 .  warfarin (COUMADIN) tablet 5 mg, 5 mg, Oral, ONCE-1600, Shanlever, Pierce Crane, RPH .  Warfarin - Pharmacist Dosing Inpatient, , Does not apply, q1600, Lu Duffel, [facility], Given at 04/06/20 1816  Medications Prior to Admission  Medication Sig Dispense Refill Last Dose  . acetaminophen (TYLENOL) 325 MG tablet Take 650 mg by mouth every 6 (six) hours.   Unknown at PRN  . carbidopa-levodopa (SINEMET IR) 25-100 MG tablet Take 1 tablet by mouth every 3 (three) hours.    24-36 hours at Unknown  . donepezil (ARICEPT) 10 MG tablet Take 10 mg by mouth daily.   24-36 hours at Unknown  . levothyroxine (SYNTHROID) 88 MCG tablet Take 88 mcg by mouth daily before breakfast.    24-36 hours at Unknown  . simvastatin (ZOCOR) 20 MG tablet Take 1 tablet (20 mg total) by mouth daily. (Patient taking differently: Take 20 mg by mouth at bedtime. ) 30 tablet 0 24-36 hours at Unknown  . Vitamin D, Ergocalciferol, (DRISDOL) 1.25 MG (50000 UNIT) CAPS capsule Take 50,000 Units by mouth every 7 (seven) days.   24-36 hours at Unknown  . warfarin (COUMADIN) 5 MG tablet Take 5 mg by mouth daily.   04/03/2020 at 1100     Allergies  Allergen Reactions  . No Known Allergies      Past Medical History:  Diagnosis Date  .  Atrial fibrillation ([place])   . BPH (benign prostatic hyperplasia)   . Cardiomyopathy ([place])   . Coronary artery disease   . Hypertension   . Hypothyroidism   . Parkinson's disease ([place])     Review of systems:  Otherwise negative.    Physical Exam  Gen: Alert, oriented. Appears stated age.  HEENT: [place]/AT. PERRLA. Lungs: CTA, no wheezes. CV: RR nl S1, S2. Abd: soft, benign, no masses. BS+ Ext: No edema. Pulses 2+    Planned procedures: Proceed with colonoscopy. The patient understands the nature of the planned procedure, indications, risks, alternatives and potential complications including but not limited to bleeding, infection, perforation, damage to internal organs and  possible oversedation/side effects from anesthesia. The patient agrees and gives consent to proceed.  Please refer to procedure notes for findings, recommendations and patient disposition/instructions.     Juandiego Kolenovic Douglas Silva, M.D. Gastroenterology 04/07/2020  8:31 AM

## 2020-04-07 NOTE — Progress Notes (Signed)
[facility] Progress Note  Patient: Douglas Silva.    QZE:[id number]  DOA: 04/03/2020     Date of Service: the patient was seen and examined on 04/07/2020  Chief Complaint  Patient presents with  . Loss of Consciousness   Brief hospital course:  Douglas Silva. is a 69 y.o. male with medical history significant of atrial fibrillation, BPH, cardiomyopathy, coronary disease, hypertension, hypothyroidism, Parkinson's diseasewith history of autonomic dysfunction and orthostasis presents to ed s/p episode of syncope. Patient states over the last few months he has had increasing symptoms of near syncope on standing but has not had associated syncopal episode in the recent past. He denies any recent illness or symptoms of chest pain , sob, n/v/ abdominal pain prior to  Or after the episode. He currently has no complaints.  ON further ros he denies fever/chills/ recent uri signs or symptoms, HA or confusion or new focal weakness.    ED Course:      Vitals:   04/03/20 1333 04/03/20 1830  BP: 107/64 110/89  Pulse: 66 81  Resp: 18 19  Temp: 97.8 F (36.6 C)   SpO2: 96% 95%   Patient ed course complicated by another episode of syncope and at that time patient was note to be in afib with rvr.  Patient episode was short lived.  Patient was treated with cardizem bolus for afib with rvr with desired effect Patient was also give ivfs  CT head: noted to be negative  AUQ:JFHLKT fibrillation with rapid ventricular response, rate is 133 bpm, nonspecific ST and T wave changes Labs: Wbc 12.5, hgb 14.1,  NA: 138,cr 1 bun 28 UA pending inr 3.3 CE negative Cxr: NAD    Assessment and Plan:  # Syncope and frequent falls secondary to orthostatic hypotension due to autonomic dysfunction/Parkinson's disease and patient is on Toprol-XL secondary to A. Fib w/RVR, and possible dehydration -s/p IV fluid NS 100 mL/h for hydration overnight, d/c'd on 6/13 am -Repeat orthostatic  BP -Continue fall precautions -compression stockings when out of bed  -6/13 started midodrine 2.5 mg p.o. 3 times daily, hold if SBP >120 and or HR >100 6/14 increased midodrine 5 mg p.o. 3 times daily, hold if SBP >140 and or HR >100 Patient is still orthostatic, blood pressure dropped while standing and patient was symptomatic   # Afib with rvr  -usually well rate controlled per cardiology notes - possible dehydration/urinary retention or infection causing uncontrolled rate  -s/p cardizem bolus noted to be effective, but I would avoid Cardizem secondary to underlying CHF LVEF 35-40% -6/12 started amiodarone 200 mg p.o. twice daily for 7 days and then decrease to amiodarone 200 mg p.o. daily -Discontinued Toprol-XL 50 mg, started Lopressor 12.5 mg p.o. twice daily with holding parameters, hold if SBP less than 140, to avoid orthostatics -pharmacy to dose warfarin inr 3.3 -cardiology consulted.   #UTI, patient had leukocytosis, urine culture ATK staph epidermidis , ua trace LE rare bacteria, not very impressive.  6/13 started doxycycline 100 mg p.o. twice daily for 3 days   # BPH, currently on voiding without any difficulty, condom catheter was placed No Active issues  # CAD and chronic systolic CHF -past echo results not available  -no signs of acute exacerbation -TTE shows LVEF 35 to 40%, LV global hypokinesis, grade 1 diastolic dysfunction  # Parkinson's disease  -resume home medications   # Hypothyroidism -resume synthroid   Body mass index is 23.73 kg/m.  Interventions:  Diet: Heart healthy/carb modified DVT Prophylaxis: Therapeutic Anticoagulation with Warfarin   Advance goals of care discussion: Full code  Family Communication: caregiver  was present at bedside, at the time of interview.  The pt provided permission to discuss medical plan with the family. Opportunity was given to ask question and all questions were answered satisfactorily.    Disposition:  Pt is from Home, admitted with syncope and frequent falls/orthostatic hypotension, still has low blood pressure, which precludes a safe discharge. Discharge to Home , when blood pressure will improve and patient will no longer be orthostatics, patient needs to ambulate before discharge without any symptoms.. Today increased midodrine and we will continue to monitor orthostatics, plan to DC in 1 to 2 days  Subjective: No overnight issues, patient was lying comfortably in the bed.  Patient denied any active issues, has not got up yet to check orthostatics and symptoms. Letter on RN texted that patient became symptomatic while standing and blood pressure dropped.    Physical Exam: General:  alert oriented to time, place, and person.  Appear in mild distress, affect appropriate Eyes: PERRLA ENT: Oral Mucosa Clear, moist  Neck: no JVD,  Cardiovascular: S1 and S2 Present, no Murmur, irregular rhythm Respiratory: good respiratory effort, Bilateral Air entry equal and Decreased, no Crackles, no wheezes Abdomen: Bowel Sound present, Soft and no tenderness,  Skin: norashes Extremities: no Pedal edema, no calf tenderness Neurologic: without any new focal findings, resting tremors of bilateral hands and rigidity, patient has Parkinson's disease and symptoms Gait not checked due to patient safety concerns  Vitals:   04/07/20 0340 04/07/20 0341 04/07/20 0818 04/07/20 1200  BP: 113/68  130/72 (!) 110/57  Pulse:  73    Resp: 20  17 18   Temp: 98.5 F (36.9 C)  98.5 F (36.9 C) 98.4 F (36.9 C)  TempSrc: Oral  Axillary Oral  SpO2: 96% 96% 97% 98%  Weight:      Height:        Intake/Output Summary (Last 24 hours) at 04/07/2020 1224 Last data filed at 04/07/2020 0900 Gross per 24 hour  Intake 360 ml  Output 1900 ml  Net -1540 ml   Filed Weights   04/03/20 1334 04/06/20 0459  Weight: 79.4 kg 79.5 kg    Data Reviewed: I have personally reviewed and interpreted daily  labs, tele strips, imagings as discussed above. I reviewed all nursing notes, pharmacy notes, vitals, pertinent old records I have discussed plan of care as described above with RN and patient/family.  CBC: Recent Labs  Lab 04/03/20 1340 04/04/20 0601 04/05/20 0450 04/06/20 0435 04/07/20 0702  WBC 12.5* 10.9* 10.2 9.0 10.9*  HGB 14.1 11.6* 10.8* 8.7* 9.4*  HCT 42.2 35.1* 32.8* 26.8* 28.5*  MCV 91.5 93.9 94.5 96.1 94.4  PLT 242 201 200 198 371   Basic Metabolic Panel: Recent Labs  Lab 04/03/20 1340 04/04/20 0601 04/06/20 0435 04/07/20 0702  NA 138 134* 138 140  K 4.1 3.4* 3.6 3.9  CL 101 100 104 106  CO2 26 29 28 28   GLUCOSE 129* 88 97 96  BUN 28* 26* 24* 23  CREATININE 1.09 0.93 0.92 0.82  CALCIUM 9.0 8.2* 8.0* 8.4*  MG  --   --  1.8 1.9  PHOS  --   --  2.6 2.7    Studies: No results found.  Scheduled Meds: . amiodarone  200 mg Oral BID  . carbidopa-levodopa  1 tablet Oral QID  . donepezil  10 mg Oral  Daily  . doxycycline  100 mg Oral Q12H  . levothyroxine  100 mcg Oral QAC breakfast  . metoprolol tartrate  12.5 mg Oral BID  . midodrine  5 mg Oral TID WC  . polyethylene glycol  17 g Oral Daily  . simvastatin  20 mg Oral QHS  . sodium chloride flush  3 mL Intravenous Q12H  . warfarin  5 mg Oral ONCE-1600  . Warfarin - Pharmacist Dosing Inpatient   Does not apply q1600   Continuous Infusions:  PRN Meds: acetaminophen **OR** acetaminophen, bisacodyl, bisacodyl, ibuprofen, ondansetron **OR** ondansetron (ZOFRAN) IV  Time spent: 35 minutes  Author: Val Riles. MD [facility] 04/07/2020 12:24 PM  To reach On-call, see care teams to locate the attending and reach out to them via [email or web address]. If 7PM-7AM, please contact night-coverage If you still have difficulty reaching the attending provider, please page the [facility] (Director on Call) for [facility] on amion for assistance.

## 2020-04-08 LAB — BASIC METABOLIC PANEL
Anion gap: 7 (ref 5–15)
BUN: 22 mg/dL (ref 8–23)
CO2: 28 mmol/L (ref 22–32)
Calcium: 8.6 mg/dL — ABNORMAL LOW (ref 8.9–10.3)
Chloride: 105 mmol/L (ref 98–111)
Creatinine, Ser: 0.8 mg/dL (ref 0.61–1.24)
GFR calc Af Amer: >60 mL/min (ref >60)
GFR calc non Af Amer: >60 mL/min (ref >60)
Glucose, Bld: 93 mg/dL (ref 70–99)
Potassium: 3.6 mmol/L (ref 3.5–5.1)
Sodium: 140 mmol/L (ref 135–145)

## 2020-04-08 LAB — CBC
HCT: 29.9 % — ABNORMAL LOW (ref 39.0–52.0)
Hemoglobin: 10.3 g/dL — ABNORMAL LOW (ref 13.0–17.0)
MCH: 32 pg (ref 26.0–34.0)
MCHC: 34.4 g/dL (ref 30.0–36.0)
MCV: 92.9 fL (ref 80.0–100.0)
Platelets: 262 10*3/uL (ref 150–400)
RBC: 3.22 MIL/uL — ABNORMAL LOW (ref 4.22–5.81)
RDW: 13.8 % (ref 11.5–15.5)
WBC: 9.8 10*3/uL (ref 4.0–10.5)
nRBC: 0.2 % (ref 0.0–0.2)

## 2020-04-08 LAB — PROTIME-INR
INR: 1.9 — ABNORMAL HIGH (ref 0.8–1.2)
Prothrombin Time: 20.9 seconds — ABNORMAL HIGH (ref 11.4–15.2)

## 2020-04-08 LAB — PHOSPHORUS: Phosphorus: 2.1 mg/dL — ABNORMAL LOW (ref 2.5–4.6)

## 2020-04-08 LAB — MAGNESIUM: Magnesium: 2 mg/dL (ref 1.7–2.4)

## 2020-04-08 LAB — SARS CORONAVIRUS 2 (TAT 6-24 HRS): SARS Coronavirus 2: NEGATIVE

## 2020-04-08 MED ORDER — POTASSIUM PHOSPHATES 15 MMOLE/5ML IV SOLN
20.0000 mmol | Freq: Once | INTRAVENOUS | Status: AC
  Administered 2020-04-08: 10:00:00 20 mmol via INTRAVENOUS
  Filled 2020-04-08: qty 6.67

## 2020-04-08 MED ORDER — WARFARIN SODIUM 3 MG PO TABS
3.0000 mg | ORAL_TABLET | Freq: Once | ORAL | Status: AC
  Administered 2020-04-08: 3 mg via ORAL
  Filled 2020-04-08: qty 1

## 2020-04-08 NOTE — Progress Notes (Signed)
[facility] Progress Note  Patient: Douglas Silva.    SKA:[id number]  DOA: 04/03/2020     Date of Service: the patient was seen and examined on 04/08/2020  Chief Complaint  Patient presents with  . Loss of Consciousness   Brief hospital course:  Lewis Keats. is a 69 y.o. male with medical history significant of atrial fibrillation, BPH, cardiomyopathy, coronary disease, hypertension, hypothyroidism, Parkinson's diseasewith history of autonomic dysfunction and orthostasis presents to ed s/p episode of syncope. Patient states over the last few months he has had increasing symptoms of near syncope on standing but has not had associated syncopal episode in the recent past. He denies any recent illness or symptoms of chest pain , sob, n/v/ abdominal pain prior to  Or after the episode. He currently has no complaints.  ON further ros he denies fever/chills/ recent uri signs or symptoms, HA or confusion or new focal weakness.    ED Course:      Vitals:   04/03/20 1333 04/03/20 1830  BP: 107/64 110/89  Pulse: 66 81  Resp: 18 19  Temp: 97.8 F (36.6 C)   SpO2: 96% 95%   Patient ed course complicated by another episode of syncope and at that time patient was note to be in afib with rvr.  Patient episode was short lived.  Patient was treated with cardizem bolus for afib with rvr with desired effect Patient was also give ivfs  CT head: noted to be negative  OMB:TDHRCB fibrillation with rapid ventricular response, rate is 133 bpm, nonspecific ST and T wave changes Labs: Wbc 12.5, hgb 14.1,  NA: 138,cr 1 bun 28 UA pending inr 3.3 CE negative Cxr: NAD    Assessment and Plan:  # Syncope and frequent falls secondary to orthostatic hypotension due to autonomic dysfunction/Parkinson's disease and patient is on Toprol-XL secondary to A. Fib w/RVR, and possible dehydration -s/p IV fluid NS 100 mL/h for hydration overnight, d/c'd on 6/13 am -Repeat orthostatic BP  few times in a day -Continue fall precautions -compression stockings when out of bed  -6/13 started midodrine 2.5 mg p.o. 3 times daily, hold if SBP >120 and or HR >100 6/14 increased midodrine 5 mg p.o. 3 times daily, hold if SBP >140 and or HR >100 Patient is still orthostatic, blood pressure dropped while standing and patient was symptomatic Neurologist recommended to not stop or decrease the dose of Sinemet which can cause hypotension so recommended to increase dose of midodrine from 5 to 10 mg p.o. 3 times daily if needed   # Afib with rvr  -usually well rate controlled per cardiology notes - possible dehydration/urinary retention or infection causing uncontrolled rate  -s/p cardizem bolus noted to be effective, but I would avoid Cardizem secondary to underlying CHF LVEF 35-40% -6/12 started amiodarone 200 mg p.o. twice daily for 7 days and then decrease to amiodarone 200 mg p.o. daily -Discontinued Toprol-XL 50 mg, started Lopressor 12.5 mg p.o. twice daily with holding parameters, hold if SBP less than 140, to avoid orthostatics -pharmacy to dose warfarin and monitor INR -cardiology consulted. Please discussed with patient regarding Coumadin, risk of fall and bleeding on discharge.   #UTI, patient had leukocytosis, urine culture ATK staph epidermidis , ua trace LE rare bacteria, not very impressive.  6/13 started doxycycline 100 mg p.o. twice daily for 3 days   # BPH, currently on voiding without any difficulty, condom catheter was placed No Active issues  # CAD and chronic  systolic CHF -past echo results not available  -no signs of acute exacerbation -TTE shows LVEF 35 to 40%, LV global hypokinesis, grade 1 diastolic dysfunction  # Parkinson's disease  -resume home medications  Neurologist recommended to continue current treatment and follow-up as an outpatient.  Also recommended to increase dose of midodrine 10 mg p.o. 3 times daily if patient is persistently  orthostatic.  Also needs to discuss about Coumadin because patient is more prone to fall and risk of bleeding  # Hypothyroidism -resume synthroid   Body mass index is 23.73 kg/m.  Interventions:      Diet: Heart healthy/carb modified DVT Prophylaxis: Therapeutic Anticoagulation with Warfarin   Advance goals of care discussion: Full code  Family Communication: caregiver  was present at bedside, at the time of interview.  The pt provided permission to discuss medical plan with the family. Opportunity was given to ask question and all questions were answered satisfactorily.   Disposition:  Pt is from Home, admitted with syncope and frequent falls/orthostatic hypotension, still has low blood pressure, which precludes a safe discharge. Discharge to Home , when blood pressure will improve and patient will no longer be orthostatics, patient needs to ambulate before discharge without any symptoms. May discharge in 1 to 2 days if orthostatics improve and patient is willing to go to SNF Please discussed with patient regarding Coumadin, risk of fall and bleeding on discharge.   Subjective: No overnight issues, patient was lying comfortably in the bed.  Patient denied any active issues.patient was symptomatic yesterday while standing up and had orthostatic hypotension.  RN was advised today to check orthostatics again.  Patient was advised to try to ambulate so that we can make a disposition plan.    TOC contacted regarding SNF placement.  Patient can be discharged in 1 to 2 days with final decision about Coumadin.    Physical Exam: General:  alert oriented to time, place, and person.  Appear in mild distress, affect appropriate Eyes: PERRLA ENT: Oral Mucosa Clear, moist  Neck: no JVD,  Cardiovascular: S1 and S2 Present, no Murmur, irregular rhythm Respiratory: good respiratory effort, Bilateral Air entry equal and Decreased, no Crackles, no wheezes Abdomen: Bowel Sound present, Soft  and no tenderness,  Skin: norashes Extremities: no Pedal edema, no calf tenderness Neurologic: without any new focal findings, resting tremors of bilateral hands and rigidity, patient has Parkinson's disease and symptoms Gait not checked due to patient safety concerns  Vitals:   04/08/20 0421 04/08/20 0753 04/08/20 1210 04/08/20 1546  BP: 113/82 135/79 (!) 143/78 106/70  Pulse: 77 77 89 77  Resp: 16 18 18 18   Temp: (!) 97.4 F (36.3 C) 97.9 F (36.6 C) 98.2 F (36.8 C) 98 F (36.7 C)  TempSrc: Oral Oral Oral   SpO2: 100% 98% 98% 98%  Weight:      Height:        Intake/Output Summary (Last 24 hours) at 04/08/2020 1610 Last data filed at 04/08/2020 1446 Gross per 24 hour  Intake 480 ml  Output 1850 ml  Net -1370 ml   Filed Weights   04/03/20 1334 04/06/20 0459  Weight: 79.4 kg 79.5 kg    Data Reviewed: I have personally reviewed and interpreted daily labs, tele strips, imagings as discussed above. I reviewed all nursing notes, pharmacy notes, vitals, pertinent old records I have discussed plan of care as described above with RN and patient/family.  CBC: Recent Labs  Lab 04/04/20 0601 04/05/20 0450 04/06/20 0435 04/07/20  2706 04/08/20 0539  WBC 10.9* 10.2 9.0 10.9* 9.8  HGB 11.6* 10.8* 8.7* 9.4* 10.3*  HCT 35.1* 32.8* 26.8* 28.5* 29.9*  MCV 93.9 94.5 96.1 94.4 92.9  PLT 201 200 198 216 237   Basic Metabolic Panel: Recent Labs  Lab 04/03/20 1340 04/04/20 0601 04/06/20 0435 04/07/20 0702 04/08/20 0539  NA 138 134* 138 140 140  K 4.1 3.4* 3.6 3.9 3.6  CL 101 100 104 106 105  CO2 26 29 28 28 28   GLUCOSE 129* 88 97 96 93  BUN 28* 26* 24* 23 22  CREATININE 1.09 0.93 0.92 0.82 0.80  CALCIUM 9.0 8.2* 8.0* 8.4* 8.6*  MG  --   --  1.8 1.9 2.0  PHOS  --   --  2.6 2.7 2.1*    Studies: No results found.  Scheduled Meds: . amiodarone  200 mg Oral BID  . carbidopa-levodopa  1 tablet Oral QID  . donepezil  10 mg Oral Daily  . doxycycline  100 mg Oral Q12H  .  levothyroxine  100 mcg Oral QAC breakfast  . metoprolol tartrate  12.5 mg Oral BID  . midodrine  5 mg Oral TID WC  . polyethylene glycol  17 g Oral Daily  . simvastatin  20 mg Oral QHS  . sodium chloride flush  3 mL Intravenous Q12H  . Warfarin - Pharmacist Dosing Inpatient   Does not apply q1600   Continuous Infusions:  PRN Meds: acetaminophen **OR** acetaminophen, bisacodyl, bisacodyl, ibuprofen, ondansetron **OR** ondansetron (ZOFRAN) IV  Time spent: 35 minutes  Author: Val Riles. MD [facility] 04/08/2020 4:10 PM  To reach On-call, see care teams to locate the attending and reach out to them via [email or web address]. If 7PM-7AM, please contact night-coverage If you still have difficulty reaching the attending provider, please page the [facility] (Director on Call) for [facility] on amion for assistance.

## 2020-04-08 NOTE — Consult Note (Signed)
Reason for Consult: PD with dysatonomia  Requesting Physician: Dr. Dwyane Dee   CC: PD, syncope   HPI: Douglas Silva. is an 69 y.o. maleith medical history significant of atrial fibrillation, BPH, cardiomyopathy, coronary disease, hypertension, hypothyroidism, progressive Parkinson's diseasewith history of autonomic dysfunction and orthostasis presents to ed s/p episode of syncope which is not the first episode. Pt is being followed by Neurology and in [place] [facility].  Pt is on midodrine 5 TID at home. Information obtained from caregiver.   Past Medical History:  Diagnosis Date  . Atrial fibrillation ([place])   . BPH (benign prostatic hyperplasia)   . Cardiomyopathy ([place])   . Coronary artery disease   . Hypertension   . Hypothyroidism   . Parkinson's disease [facility])     Past Surgical History:  Procedure Laterality Date  . COLONOSCOPY N/A 04/10/2015   Procedure: COLONOSCOPY;  Surgeon: Manya Silvas, MD;  Location: [facility] ENDOSCOPY;  Service: Endoscopy;  Laterality: N/A;  . CORONARY ANGIOPLASTY    . DIALYSIS/PERMA CATHETER INSERTION N/A 09/05/2017   Procedure: DIALYSIS/PERMA CATHETER INSERTION;  Surgeon: Algernon Huxley, MD;  Location: [facility];  Service: Cardiovascular;  Laterality: N/A;    Family History  Problem Relation Age of Onset  . Lung cancer Mother   . Esophageal cancer Father     Social History:  reports that he has never smoked. He has never used smokeless tobacco. He reports that he does not drink alcohol and does not use drugs.  Allergies  Allergen Reactions  . No Known Allergies     Medications: I have reviewed the patient's current medications.  ROS: Unable to obtain due to somnolence   Physical Examination: Blood pressure 135/79, pulse 77, temperature 97.9 F (36.6 C), temperature source Oral, resp. rate 18, height 6' (1.829 m), weight 79.5 kg, SpO2 98 %.   Neurological Examination   Mental Status: Alert to name only. Cranial  Nerves: III,IV, VI: ptosis not present, extra-ocular motions intact bilaterally V,VII: smile symmetric. Mask like face  VIII: hearing normal bilaterally XII: midline tongue extension Motor: Right : Upper extremity   4/5    Left:     Upper extremity   4/5  Lower extremity   3/5     Lower extremity   3/5 Tone and bulk:normal tone throughout; no atrophy noted Sensory: Pinprick and light touch intact throughout, bilaterally Deep Tendon Reflexes: not tested  Plantars: Right: downgoing   Left: downgoing Cerebellar: Not tested       Laboratory Studies:   Basic Metabolic Panel: Recent Labs  Lab 04/03/20 1340 04/03/20 1340 04/04/20 0601 04/04/20 0601 04/06/20 0435 04/07/20 0702 04/08/20 0539  NA 138  --  134*  --  138 140 140  K 4.1  --  3.4*  --  3.6 3.9 3.6  CL 101  --  100  --  104 106 105  CO2 26  --  29  --  28 28 28   GLUCOSE 129*  --  88  --  97 96 93  BUN 28*  --  26*  --  24* 23 22  CREATININE 1.09  --  0.93  --  0.92 0.82 0.80  CALCIUM 9.0   < > 8.2*   < > 8.0* 8.4* 8.6*  MG  --   --   --   --  1.8 1.9 2.0  PHOS  --   --   --   --  2.6 2.7 2.1*   < > =  values in this interval not displayed.    Liver Function Tests: Recent Labs  Lab 04/04/20 0601  AST 14*  ALT 7  ALKPHOS 52  BILITOT 1.5*  PROT 6.0*  ALBUMIN 3.1*   No results for input(s): LIPASE, AMYLASE in the last 168 hours. No results for input(s): AMMONIA in the last 168 hours.  CBC: Recent Labs  Lab 04/04/20 0601 04/05/20 0450 04/06/20 0435 04/07/20 0702 04/08/20 0539  WBC 10.9* 10.2 9.0 10.9* 9.8  HGB 11.6* 10.8* 8.7* 9.4* 10.3*  HCT 35.1* 32.8* 26.8* 28.5* 29.9*  MCV 93.9 94.5 96.1 94.4 92.9  PLT 201 200 198 216 262    Cardiac Enzymes: No results for input(s): CKTOTAL, CKMB, CKMBINDEX, TROPONINI in the last 168 hours.  BNP: Invalid input(s): POCBNP  CBG: No results for input(s): GLUCAP in the last 168 hours.  Microbiology: Results for orders placed or performed during the  hospital encounter of 04/03/20  Urine culture     Status: Abnormal   Collection Time: 04/04/20  6:01 AM   Specimen: Urine, Random  Result Value Ref Range Status   Specimen Description   Final    URINE, RANDOM Performed at [facility], [address], [place], [place] [address]    Special Requests   Final    NONE Performed at [facility], [place], [place] [address]    Culture 80,000 COLONIES/mL STAPHYLOCOCCUS EPIDERMIDIS (A)  Final   Report Status 04/06/2020 FINAL  Final   Organism ID, Bacteria STAPHYLOCOCCUS EPIDERMIDIS (A)  Final      Susceptibility   Staphylococcus epidermidis - MIC*    CIPROFLOXACIN <=0.5 SENSITIVE Sensitive     GENTAMICIN <=0.5 SENSITIVE Sensitive     NITROFURANTOIN <=16 SENSITIVE Sensitive     OXACILLIN <=0.25 SENSITIVE Sensitive     TETRACYCLINE <=1 SENSITIVE Sensitive     VANCOMYCIN 1 SENSITIVE Sensitive     TRIMETH/SULFA <=10 SENSITIVE Sensitive     CLINDAMYCIN <=0.25 SENSITIVE Sensitive     RIFAMPIN <=0.5 SENSITIVE Sensitive     Inducible Clindamycin NEGATIVE Sensitive     * 80,000 COLONIES/mL STAPHYLOCOCCUS EPIDERMIDIS  SARS Coronavirus 2 by RT PCR (hospital order, performed in [place] hospital lab) Nasopharyngeal Nasopharyngeal Swab     Status: None   Collection Time: 04/05/20  4:50 AM   Specimen: Nasopharyngeal Swab  Result Value Ref Range Status   SARS Coronavirus 2 NEGATIVE NEGATIVE Final    Comment: (NOTE) SARS-CoV-2 target nucleic acids are NOT DETECTED.  The SARS-CoV-2 RNA is generally detectable in upper and lower respiratory specimens during the acute phase of infection. The lowest concentration of SARS-CoV-2 viral copies this assay can detect is 250 copies / mL. A negative result does not preclude SARS-CoV-2 infection and should not be used as the sole basis for treatment or other patient management decisions.  A negative result may occur with improper specimen collection / handling, submission  of specimen other than nasopharyngeal swab, presence of viral mutation(s) within the areas targeted by this assay, and inadequate number of viral copies (<250 copies / mL). A negative result must be combined with clinical observations, patient history, and epidemiological information.  Fact Sheet for Patients:   StrictlyIdeas.no  Fact Sheet for Healthcare Providers: BankingDealers.co.za  This test is not yet approved or  cleared by the [place] FDA and has been authorized for detection and/or diagnosis of SARS-CoV-2 by FDA under an Emergency Use Authorization (EUA).  This EUA will remain in effect (meaning this test  can be used) for the duration of the COVID-19 declaration under Section 564(b)(1) of the Act, 21 U.S.C. section 360bbb-3(b)(1), unless the authorization is terminated or revoked sooner.  Performed at [facility] [address] [place], [place] [address]     Coagulation Studies: Recent Labs    04/06/20 0435 04/07/20 0702 04/08/20 0539  LABPROT 24.9* 20.2* 20.9*  INR 2.3* 1.8* 1.9*    Urinalysis:  Recent Labs  Lab 04/04/20 0601  COLORURINE YELLOW*  LABSPEC 1.014  PHURINE 5.0  GLUCOSEU NEGATIVE  HGBUR NEGATIVE  BILIRUBINUR NEGATIVE  KETONESUR 20*  PROTEINUR NEGATIVE  NITRITE NEGATIVE  LEUKOCYTESUR TRACE*    Lipid Panel:  No results found for: CHOL, TRIG, HDL, CHOLHDL, VLDL, LDLCALC  HgbA1C:  Lab Results  Component Value Date   HGBA1C 5.9 (H) 04/03/2020    Urine Drug Screen:  No results found for: LABOPIA, COCAINSCRNUR, LABBENZ, AMPHETMU, THCU, LABBARB  Alcohol Level: No results for input(s): ETH in the last 168 hours.  Other results: EKG: normal EKG, normal sinus rhythm, unchanged from previous tracings.  Imaging: No results found.   Assessment/Plan:  69 y.o. maleith medical history significant of atrial fibrillation, BPH, cardiomyopathy, coronary disease, hypertension,  hypothyroidism, progressive Parkinson's diseasewith history of autonomic dysfunction and orthostasis presents to ed s/p episode of syncope which is not the first episode. Pt is being followed by Neurology and in [facility] [place] [facility].  Pt is on midodrine 5 TID at home. Information obtained from caregiver.   - Progressive PD with dysautonomia - pt is on sinemet qid at home. Dopamine lowers blood pressure but at same time pt was hypotensive on presentation.  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 Earnestine Mealing at [facility] - family and pt discussions on goals of care - pt is on warfarin for A-fib, discussions on its continuation depending on how frequent the falls are.   04/08/2020, 11:17 AM

## 2020-04-08 NOTE — Progress Notes (Signed)
[name], owner of [employer], notified regarding patient expressing to staff caregiver Malachy Mood is "mean," "brings kids to house," and "cusses" at him. [name] to address. Patient aware company was called.

## 2020-04-08 NOTE — Consult Note (Addendum)
[place] for Warfarin Indication: atrial fibrillation  Patient Measurements: Height: 6' (182.9 cm) Weight: 79.5 kg (175 lb 4.3 oz) IBW/kg (Calculated) : 77.6  Vital Signs: Temp: 97.4 F (36.3 C) (06/15 0421) Temp Source: Oral (06/15 0421) BP: 113/82 (06/15 0421) Pulse Rate: 77 (06/15 0421)  Labs: Recent Labs    04/06/20 0435 04/06/20 0435 04/07/20 0702 04/08/20 0539  HGB 8.7*   < > 9.4* 10.3*  HCT 26.8*  --  28.5* 29.9*  PLT 198  --  216 262  LABPROT 24.9*  --  20.2* 20.9*  INR 2.3*  --  1.8* 1.9*  CREATININE 0.92  --  0.82 0.80   < > = values in this interval not displayed.    Estimated Creatinine Clearance: 98.3 mL/min (by C-G formula based on SCr of 0.8 mg/dL).   Medical History: Past Medical History:  Diagnosis Date  . Atrial fibrillation ([place])   . BPH (benign prostatic hyperplasia)   . Cardiomyopathy ([place])   . Coronary artery disease   . Hypertension   . Hypothyroidism   . Parkinson's disease [facility])     Assessment: 69 year old male with BPH, cardiomyopathy, coronary disease, hypertension, hypothyroidism, Parkinson's diseasewith history of autonomic dysfunction, orthostasis, and a fib on warfarin therapy. Home dose is 5 mg daily. Pharmacy has been consulted to monitor/adjust warfarin dosing.   DDIs: amiodarone added 6/12  Date   INR   Dose 6/10   3.3 6/11   3.5   2.5 mg  6/12   3.1   2.5 mg 6/13   2.3   2.5 mg 6/14    1.8     5  Mg 6/15   1.9     Goal of Therapy:  INR 2-3 Monitor platelets by anticoagulation protocol: Yes   Plan:   INR is slightly sub-therapeutic: will dose Warfarin 3mg  (to account for DDI with amiodarone, doxycycline, and simvastatin)  H&H/platelets WNL and stable: continue to follow  Repeat INR in am to guide dosing  Lu Duffel, PharmD, BCPS Clinical Pharmacist 04/08/2020 7:27 AM

## 2020-04-08 NOTE — Care Management Important Message (Signed)
Important Message  Patient Details  Name: Douglas Silva. MRN: [id number] Date of Birth: Feb 27, 1951   Medicare Important Message Given:  Yes     [name] 04/08/2020, 11:38 AM

## 2020-04-09 DIAGNOSIS — I951 Orthostatic hypotension: Principal | ICD-10-CM

## 2020-04-09 LAB — MAGNESIUM: Magnesium: 1.9 mg/dL (ref 1.7–2.4)

## 2020-04-09 LAB — PROTIME-INR
INR: 2 — ABNORMAL HIGH (ref 0.8–1.2)
Prothrombin Time: 22.1 seconds — ABNORMAL HIGH (ref 11.4–15.2)

## 2020-04-09 LAB — CBC
HCT: 32.3 % — ABNORMAL LOW (ref 39.0–52.0)
Hemoglobin: 10.9 g/dL — ABNORMAL LOW (ref 13.0–17.0)
MCH: 31.2 pg (ref 26.0–34.0)
MCHC: 33.7 g/dL (ref 30.0–36.0)
MCV: 92.6 fL (ref 80.0–100.0)
Platelets: 286 10*3/uL (ref 150–400)
RBC: 3.49 MIL/uL — ABNORMAL LOW (ref 4.22–5.81)
RDW: 14.4 % (ref 11.5–15.5)
WBC: 10.6 10*3/uL — ABNORMAL HIGH (ref 4.0–10.5)
nRBC: 0.2 % (ref 0.0–0.2)

## 2020-04-09 LAB — BASIC METABOLIC PANEL
Anion gap: 8 (ref 5–15)
BUN: 28 mg/dL — ABNORMAL HIGH (ref 8–23)
CO2: 27 mmol/L (ref 22–32)
Calcium: 8.6 mg/dL — ABNORMAL LOW (ref 8.9–10.3)
Chloride: 104 mmol/L (ref 98–111)
Creatinine, Ser: 0.88 mg/dL (ref 0.61–1.24)
GFR calc Af Amer: >60 mL/min (ref >60)
GFR calc non Af Amer: >60 mL/min (ref >60)
Glucose, Bld: 96 mg/dL (ref 70–99)
Potassium: 4 mmol/L (ref 3.5–5.1)
Sodium: 139 mmol/L (ref 135–145)

## 2020-04-09 LAB — PHOSPHORUS: Phosphorus: 2.7 mg/dL (ref 2.5–4.6)

## 2020-04-09 MED ORDER — AMIODARONE HCL 200 MG PO TABS: 200.0000 mg | ORAL_TABLET | Freq: Two times a day (BID) | ORAL | 0 refills | Status: DC

## 2020-04-09 MED ORDER — IBUPROFEN 400 MG PO TABS: 400.0000 mg | ORAL_TABLET | Freq: Three times a day (TID) | ORAL | 0 refills | Status: DC | PRN

## 2020-04-09 MED ORDER — MIDODRINE HCL 5 MG PO TABS: 5.0000 mg | ORAL_TABLET | Freq: Three times a day (TID) | ORAL | 0 refills | Status: AC

## 2020-04-09 MED ORDER — AMIODARONE HCL 200 MG PO TABS: ORAL_TABLET | ORAL | 0 refills | Status: DC

## 2020-04-09 MED ORDER — WARFARIN SODIUM 3 MG PO TABS: 3.0000 mg | ORAL_TABLET | Freq: Once | ORAL | 0 refills | Status: DC

## 2020-04-09 MED ORDER — WARFARIN SODIUM 3 MG PO TABS
3.0000 mg | ORAL_TABLET | Freq: Once | ORAL | Status: DC
  Filled 2020-04-09: qty 1

## 2020-04-09 MED ORDER — METOPROLOL TARTRATE 25 MG PO TABS: 12.5000 mg | ORAL_TABLET | Freq: Two times a day (BID) | ORAL | 0 refills | Status: AC

## 2020-04-09 MED ORDER — CARBIDOPA-LEVODOPA 25-100 MG PO TABS: 1.0000 | ORAL_TABLET | Freq: Four times a day (QID) | ORAL | Status: AC

## 2020-04-09 NOTE — Consult Note (Signed)
[place] for Warfarin Indication: atrial fibrillation  Patient Measurements: Height: 6' (182.9 cm) Weight: 79.5 kg (175 lb 4.3 oz) IBW/kg (Calculated) : 77.6  Vital Signs: Temp: 97.6 F (36.4 C) (06/16 0328) Temp Source: Oral (06/16 0328) BP: 120/69 (06/16 0328) Pulse Rate: 98 (06/16 0328)  Labs: Recent Labs    04/07/20 0702 04/07/20 0702 04/08/20 0539 04/09/20 0406  HGB 9.4*   < > 10.3* 10.9*  HCT 28.5*  --  29.9* 32.3*  PLT 216  --  262 286  LABPROT 20.2*  --  20.9* 22.1*  INR 1.8*  --  1.9* 2.0*  CREATININE 0.82  --  0.80 0.88   < > = values in this interval not displayed.    Estimated Creatinine Clearance: 89.4 mL/min (by C-G formula based on SCr of 0.88 mg/dL).   Medical History: Past Medical History:  Diagnosis Date  . Atrial fibrillation ([place])   . BPH (benign prostatic hyperplasia)   . Cardiomyopathy ([place])   . Coronary artery disease   . Hypertension   . Hypothyroidism   . Parkinson's disease [facility])     Assessment: 69 year old male with BPH, cardiomyopathy, coronary disease, hypertension, hypothyroidism, Parkinson's diseasewith history of autonomic dysfunction, orthostasis, and a fib on warfarin therapy. Home dose is 5 mg daily. Pharmacy has been consulted to monitor/adjust warfarin dosing.   DDIs: amiodarone added 6/12  Date   INR   Dose 6/10   3.3 6/11   3.5   2.5 mg  6/12   3.1   2.5 mg 6/13   2.3   2.5 mg 6/14    1.8     5  mg 6/15   1.9     3  mg   6/16   2.0  Goal of Therapy:  INR 2-3 Monitor platelets by anticoagulation protocol: Yes   Plan:   INR is slightly therapeutic: will continue Warfarin 3mg  (to account for DDI with amiodarone, doxycycline, and simvastatin)  H&H/platelets WNL and stable: continue to follow  Repeat INR in am to guide dosing  Lu Duffel, PharmD, BCPS Clinical Pharmacist 04/09/2020 7:38 AM

## 2020-04-09 NOTE — Discharge Summary (Signed)
Physician Discharge Summary  Douglas Silva. [name]:[id number] DOB: 08-23-1951 DOA: 04/03/2020  PCP: Remi Haggard, FNP  Admit date: 04/03/2020 Discharge date: 04/09/2020  Admitted From: Home Disposition: SNF  Recommendations for Outpatient Follow-up:  1. Follow up with SNF provider at earliest convenience with repeat INR in the next 2 days 2. Outpatient follow-up with neurology and cardiology 3. Follow up in ED if symptoms worsen or new appear   Home Health: No Equipment/Devices: None  Discharge Condition: Guarded CODE STATUS: Full Diet recommendation: Heart healthy  Brief/Interim Summary: 69 year old male with history of chronic atrial fibrillation on Coumadin, BPH, cardiomyopathy, coronary artery disease, hypertension, hypothyroidism, Parkinson's disease with history of autonomic dysfunction and orthostasis presented with syncope.  During the hospitalization, course was complicated with A. fib with RVR for which patient was initially treated with Cardizem but subsequently Cardizem was not continued because of history of low EF.  Cardiology was consulted.  He was started on amiodarone.  Toprol-XL was discontinued and was switched to Lopressor twice a day.  He was found to be orthostatic for which he was started on midodrine 5 mg 3 times a day.  PT recommended SNF placement.  He will be discharged to SNF once bed is available.  Outpatient follow-up with neurology/cardiology  Discharge Diagnoses:   Syncope and frequent falls -Secondary to orthostatic hypotension due to autonomic dysfunction/Parkinson's disease along with A. fib with RVR and possible dehydration -Treated with IV fluids during the hospitalization -Had issues with orthostatic hypotension during hospitalization as well.  Started on midodrine and dose has been increased to 5 mg 3 times a day.  Blood pressure much improved currently.  Neurology recommended that if patient has further issues with orthostasis, midodrine can  be further increased to 10 mg 3 times a day as needed. -Continue current dose of midodrine 5 mg 3 times a day for now. -PT recommended SNF placement.  Discharge to SNF once bed is available.  A. fib with RVR in a patient with chronic A. Fib -Status post Cardizem bolus initially but Cardizem was avoided because of underlying CHF -He was started on amiodarone 200 mg twice a day for 7 days on 04/05/2020.  Switch to amiodarone 200 mg once a day from 04/12/2020 onwards. -Toprol-XL has been switched to Lopressor 12.5 mg twice a day.  Continue Coumadin based on INR values.  INR therapeutic today.  Outpatient follow-up with cardiology  UTI with staph epidermidis -Treated with few days of doxycycline.  No more antibiotics on discharge  BPH -Outpatient follow-up with urology if needed  CAD Chronic systolic CHF -Currently compensated.  Past echo results not available.  TTE showed EF of 35 to 40% with LV global hypokinesis, grade 1 diastolic dysfunction.  Outpatient follow-up with cardiology  Parkinson's disease with resulting autonomic dysfunction -Neurology evaluation appreciated.  Continue Sinemet 4 times a day.  Outpatient follow-up with neurology -We will need goals of care discussion as an outpatient  Hypothyroidism -Continue Synthroid    Discharge Instructions  Discharge Instructions    Ambulatory referral to Cardiology   Complete by: As directed    Diet - low sodium heart healthy   Complete by: As directed    Diet - low sodium heart healthy   Complete by: As directed    Discharge instructions   Complete by: As directed    It was pleasure taking care of you. I am increasing the dose of metoprolol from 25 to 50 mg daily. Taking Lasix and follow-up with your primary  care physician and neurologist for further recommendations.   Increase activity slowly   Complete by: As directed    Increase activity slowly   Complete by: As directed      Allergies as of 04/09/2020      Reactions    No Known Allergies       Medication List    STOP taking these medications   furosemide 20 MG tablet Commonly known as: LASIX   metoprolol succinate 25 MG 24 hr tablet Commonly known as: TOPROL-XL   potassium chloride 10 MEQ tablet Commonly known as: KLOR-CON     TAKE these medications   acetaminophen 325 MG tablet Commonly known as: TYLENOL Take 650 mg by mouth every 6 (six) hours.   amiodarone 200 MG tablet Commonly known as: PACERONE Take 1 tablet (200 mg total) by mouth 2 (two) times daily.   carbidopa-levodopa 25-100 MG tablet Commonly known as: SINEMET IR Take 1 tablet by mouth 4 (four) times daily. What changed: when to take this   donepezil 10 MG tablet Commonly known as: ARICEPT Take 10 mg by mouth daily.   ibuprofen 400 MG tablet Commonly known as: ADVIL Take 1 tablet (400 mg total) by mouth every 8 (eight) hours as needed for mild pain or moderate pain.   levothyroxine 88 MCG tablet Commonly known as: SYNTHROID Take 88 mcg by mouth daily before breakfast.   metoprolol tartrate 25 MG tablet Commonly known as: LOPRESSOR Take 0.5 tablets (12.5 mg total) by mouth 2 (two) times daily.   midodrine 5 MG tablet Commonly known as: PROAMATINE Take 1 tablet (5 mg total) by mouth 3 (three) times daily with meals.   simvastatin 20 MG tablet Commonly known as: ZOCOR Take 1 tablet (20 mg total) by mouth daily. What changed: when to take this   Vitamin D (Ergocalciferol) 1.25 MG (50000 UNIT) Caps capsule Commonly known as: DRISDOL Take 50,000 Units by mouth every 7 (seven) days.   warfarin 3 MG tablet Commonly known as: COUMADIN Take 1 tablet (3 mg total) by mouth one time only at 4 PM. What changed:   medication strength  how much to take  when to take this       Contact information for follow-up providers    Remi Haggard, FNP. Schedule an appointment as soon as possible for a visit.   Specialty: Family Medicine Contact information: [address] [place] [address] [phone]        Neurology. Schedule an appointment as soon as possible for a visit in 1 week(s).            Contact information for after-discharge care    [facility] .   Service: Skilled Nursing Contact information: [place] [phone]                 Allergies  Allergen Reactions  . No Known Allergies     Consultations:  Cardiology/neurology   Procedures/Studies: DG Chest 1 View  Result Date: 04/03/2020 CLINICAL DATA:  69 year old male with weakness, syncope. EXAM: CHEST  1 VIEW COMPARISON:  Chest radiographs 01/01/2020 and earlier. FINDINGS: Portable AP upright view at 2204 hours. Lung volumes and mediastinal contours are stable and within normal limits. Visualized tracheal air column is within normal limits. Mild chronic pulmonary interstitial markings appear stable - including in the right mid lung since 2019. Otherwise Allowing for portable technique the lungs are clear. No pneumothorax. Negative  visible bowel gas pattern. No acute osseous abnormality identified. IMPRESSION: No acute cardiopulmonary abnormality. Electronically Signed   By: Genevie Ann M.D.   On: 04/03/2020 22:11   CT HEAD WO CONTRAST  Result Date: 04/03/2020 CLINICAL DATA:  Parkinson's disease, syncope, abnormal EKG EXAM: CT HEAD WITHOUT CONTRAST TECHNIQUE: Contiguous axial images were obtained from the base of the skull through the vertex without intravenous contrast. COMPARISON:  10/11/2018 FINDINGS: Brain: No acute infarct or hemorrhage. Lateral ventricles and midline structures are unremarkable. No acute extra-axial fluid collections. No mass effect. Vascular: No hyperdense vessel or unexpected calcification. Skull: Normal. Negative for fracture or focal lesion. Sinuses/Orbits: No acute finding. Other: None. IMPRESSION: 1. Stable head CT, no acute process. Electronically  Signed   By: Randa Ngo M.D.   On: 04/03/2020 21:52   ECHOCARDIOGRAM COMPLETE  Result Date: 04/04/2020    ECHOCARDIOGRAM REPORT   Patient Name:   Douglas Silva. Date of Exam: 04/04/2020 Medical Rec #:  [id number]             Height:       72.0 in Accession #:    [id number]            Weight:       175.0 lb Date of Birth:  February 15, 1951             BSA:          2.013 m Patient Age:    3 years              BP:           137/106 mmHg Patient Gender: M                     HR:           114 bpm. Exam Location:  [facility] Procedure: 2D Echo, Cardiac Doppler and Color Doppler Indications:     Syncope 780.2  History:         Patient has no prior history of Echocardiogram examinations.                  Cardiomyopathy, Arrythmias:Atrial Fibrillation; Risk                  Factors:Hypertension.  Sonographer:     Sherrie Sport RDCS (AE) Referring Phys:  [id number] [facility] AMIN Diagnosing Phys: Yolonda Kida MD IMPRESSIONS  1. Left ventricular ejection fraction, by estimation, is 35 to 40%. The left ventricle has moderately decreased function. The left ventricle demonstrates global hypokinesis. The left ventricular internal cavity size was moderately dilated. Left ventricular diastolic parameters are consistent with Grade I diastolic dysfunction (impaired relaxation).  2. Right ventricular systolic function is mildly reduced. The right ventricular size is mildly enlarged. There is normal pulmonary artery systolic pressure.  3. Right atrial size was moderately dilated.  4. The mitral valve is grossly normal. Trivial mitral valve regurgitation.  5. The aortic valve is abnormal. Aortic valve regurgitation is not visualized. FINDINGS  Left Ventricle: Left ventricular ejection fraction, by estimation, is 35 to 40%. The left ventricle has moderately decreased function. The left ventricle demonstrates global hypokinesis. The left ventricular internal cavity size was moderately dilated. There is no left ventricular hypertrophy.  Left ventricular diastolic parameters are consistent with Grade I diastolic dysfunction (impaired relaxation). Right Ventricle: The right ventricular size is mildly enlarged. No increase in right ventricular wall thickness. Right ventricular systolic function is mildly reduced. There is normal pulmonary  artery systolic pressure. The tricuspid regurgitant velocity  is 2.29 m/s, and with an assumed right atrial pressure of 10 mmHg, the estimated right ventricular systolic pressure is 07.8 mmHg. Left Atrium: Left atrial size was normal in size. Right Atrium: Right atrial size was moderately dilated. Pericardium: There is no evidence of pericardial effusion. Mitral Valve: The mitral valve is grossly normal. Trivial mitral valve regurgitation. Tricuspid Valve: The tricuspid valve is grossly normal. Tricuspid valve regurgitation is mild. Aortic Valve: The aortic valve is abnormal. Aortic valve regurgitation is not visualized. Aortic valve mean gradient measures 1.5 mmHg. Aortic valve peak gradient measures 2.4 mmHg. Aortic valve area, by VTI measures 2.68 cm. Pulmonic Valve: The pulmonic valve was normal in structure. Pulmonic valve regurgitation is not visualized. Aorta: The aortic root is normal in size and structure. IAS/Shunts: No atrial level shunt detected by color flow Doppler.  LEFT VENTRICLE PLAX 2D LVIDd:         5.30 cm LVIDs:         4.37 cm LV PW:         1.53 cm LV IVS:        0.89 cm LVOT diam:     2.20 cm LV SV:         32 LV SV Index:   16 LVOT Area:     3.80 cm  RIGHT VENTRICLE RV Basal diam:  3.15 cm RV S prime:     13.50 cm/s TAPSE (M-mode): 3.3 cm LEFT ATRIUM            Index       RIGHT ATRIUM           Index LA diam:      4.00 cm  1.99 cm/m  RA Area:     24.40 cm LA Vol (A2C): 57.0 ml  28.32 ml/m RA Volume:   70.10 ml  34.82 ml/m LA Vol (A4C): 121.0 ml 60.11 ml/m  AORTIC VALVE                   PULMONIC VALVE AV Area (Vmax):    2.45 cm    PV Vmax:        0.57 m/s AV Area (Vmean):   2.35 cm     PV Peak grad:   1.3 mmHg AV Area (VTI):     2.68 cm    RVOT Peak grad: 1 mmHg AV Vmax:           78.20 cm/s AV Vmean:          56.200 cm/s AV VTI:            0.118 m AV Peak Grad:      2.4 mmHg AV Mean Grad:      1.5 mmHg LVOT Vmax:         50.30 cm/s LVOT Vmean:        34.800 cm/s LVOT VTI:          0.084 m LVOT/AV VTI ratio: 0.70  AORTA Ao Root diam: 3.70 cm MITRAL VALVE               TRICUSPID VALVE MV Area (PHT): 3.97 cm    TR Peak grad:   21.0 mmHg MV Decel Time: 191 msec    TR Vmax:        229.00 cm/s MV E velocity: 65.60 cm/s  SHUNTS                            Systemic VTI:  0.08 m                            Systemic Diam: 2.20 cm Yolonda Kida MD Electronically signed by Yolonda Kida MD Signature Date/Time: 04/04/2020/4:47:00 PM    Final        Subjective: Patient seen and examined at bedside.  Denies any overnight fever, nausea, vomiting.  Does have some upper extremity muscle stiffness.  Feels okay to go to rehab today.  Discharge Exam: Vitals:   04/09/20 0328 04/09/20 0757  BP: 120/69 118/88  Pulse: 98 86  Resp: 14 18  Temp: 97.6 F (36.4 C) 98.1 F (36.7 C)  SpO2: 98% 98%    General: Pt is alert, awake, not in acute distress.  Looks older than stated age.  Bilateral upper extremity tremor present Cardiovascular: rate controlled, S1/S2 + Respiratory: bilateral decreased breath sounds at bases Abdominal: Soft, NT, ND, bowel sounds + Extremities: no edema, no cyanosis    The results of significant diagnostics from this hospitalization (including imaging, microbiology, ancillary and laboratory) are listed below for reference.     Microbiology: Recent Results (from the past 240 hour(s))  Urine culture     Status: Abnormal   Collection Time: 04/04/20  6:01 AM   Specimen: Urine, Random  Result Value Ref Range Status   Specimen Description   Final    URINE, RANDOM Performed at [facility], [address], [place],  [place] [address]    Special Requests   Final    NONE Performed at [facility], [place], [place] [address]    Culture 80,000 COLONIES/mL STAPHYLOCOCCUS EPIDERMIDIS (A)  Final   Report Status 04/06/2020 FINAL  Final   Organism ID, Bacteria STAPHYLOCOCCUS EPIDERMIDIS (A)  Final      Susceptibility   Staphylococcus epidermidis - MIC*    CIPROFLOXACIN <=0.5 SENSITIVE Sensitive     GENTAMICIN <=0.5 SENSITIVE Sensitive     NITROFURANTOIN <=16 SENSITIVE Sensitive     OXACILLIN <=0.25 SENSITIVE Sensitive     TETRACYCLINE <=1 SENSITIVE Sensitive     VANCOMYCIN 1 SENSITIVE Sensitive     TRIMETH/SULFA <=10 SENSITIVE Sensitive     CLINDAMYCIN <=0.25 SENSITIVE Sensitive     RIFAMPIN <=0.5 SENSITIVE Sensitive     Inducible Clindamycin NEGATIVE Sensitive     * 80,000 COLONIES/mL STAPHYLOCOCCUS EPIDERMIDIS  SARS Coronavirus 2 by RT PCR (hospital order, performed in [place] hospital lab) Nasopharyngeal Nasopharyngeal Swab     Status: None   Collection Time: 04/05/20  4:50 AM   Specimen: Nasopharyngeal Swab  Result Value Ref Range Status   SARS Coronavirus 2 NEGATIVE NEGATIVE Final    Comment: (NOTE) SARS-CoV-2 target nucleic acids are NOT DETECTED.  The SARS-CoV-2 RNA is generally detectable in upper and lower respiratory specimens during the acute phase of infection. The lowest concentration of SARS-CoV-2 viral copies this assay can detect is 250 copies / mL. A negative result does not preclude SARS-CoV-2 infection and should not be used as the sole basis for treatment or other patient management decisions.  A negative result may occur with improper specimen collection / handling, submission of specimen other than nasopharyngeal swab, presence of viral mutation(s) within the areas targeted by this assay, and inadequate number of  viral copies (<250 copies / mL). A negative result must be combined with clinical observations, patient history, and epidemiological  information.  Fact Sheet for Patients:   StrictlyIdeas.no  Fact Sheet for Healthcare Providers: BankingDealers.co.za  This test is not yet approved or  cleared by the [place] FDA and has been authorized for detection and/or diagnosis of SARS-CoV-2 by FDA under an Emergency Use Authorization (EUA).  This EUA will remain in effect (meaning this test can be used) for the duration of the COVID-19 declaration under Section 564(b)(1) of the Act, 21 U.S.C. section 360bbb-3(b)(1), unless the authorization is terminated or revoked sooner.  Performed at [facility], [place], [place] [address]   SARS CORONAVIRUS 2 (TAT 6-24 HRS) Nasopharyngeal Nasopharyngeal Swab     Status: None   Collection Time: 04/08/20 12:13 PM   Specimen: Nasopharyngeal Swab  Result Value Ref Range Status   SARS Coronavirus 2 NEGATIVE NEGATIVE Final    Comment: (NOTE) SARS-CoV-2 target nucleic acids are NOT DETECTED.  The SARS-CoV-2 RNA is generally detectable in upper and lower respiratory specimens during the acute phase of infection. Negative results do not preclude SARS-CoV-2 infection, do not rule out co-infections with other pathogens, and should not be used as the sole basis for treatment or other patient management decisions. Negative results must be combined with clinical observations, patient history, and epidemiological information. The expected result is Negative.  Fact Sheet for Patients: SugarRoll.be  Fact Sheet for Healthcare Providers: https://www.woods-mathews.com/  This test is not yet approved or cleared by the [place] FDA and  has been authorized for detection and/or diagnosis of SARS-CoV-2 by FDA under an Emergency Use Authorization (EUA). This EUA will remain  in effect (meaning this test can be used) for the duration of the COVID-19 declaration under Se ction  564(b)(1) of the Act, 21 U.S.C. section 360bbb-3(b)(1), unless the authorization is terminated or revoked sooner.  Performed at [facility], [place] [address] [place], [place] [address]      Labs: BNP (last 3 results) No results for input(s): BNP in the last 8760 hours. Basic Metabolic Panel: Recent Labs  Lab 04/04/20 0601 04/06/20 0435 04/07/20 0702 04/08/20 0539 04/09/20 0406  NA 134* 138 140 140 139  K 3.4* 3.6 3.9 3.6 4.0  CL 100 104 106 105 104  CO2 29 28 28 28 27   GLUCOSE 88 97 96 93 96  BUN 26* 24* 23 22 28*  CREATININE 0.93 0.92 0.82 0.80 0.88  CALCIUM 8.2* 8.0* 8.4* 8.6* 8.6*  MG  --  1.8 1.9 2.0 1.9  PHOS  --  2.6 2.7 2.1* 2.7   Liver Function Tests: Recent Labs  Lab 04/04/20 0601  AST 14*  ALT 7  ALKPHOS 52  BILITOT 1.5*  PROT 6.0*  ALBUMIN 3.1*   No results for input(s): LIPASE, AMYLASE in the last 168 hours. No results for input(s): AMMONIA in the last 168 hours. CBC: Recent Labs  Lab 04/05/20 0450 04/06/20 0435 04/07/20 0702 04/08/20 0539 04/09/20 0406  WBC 10.2 9.0 10.9* 9.8 10.6*  HGB 10.8* 8.7* 9.4* 10.3* 10.9*  HCT 32.8* 26.8* 28.5* 29.9* 32.3*  MCV 94.5 96.1 94.4 92.9 92.6  PLT 200 198 216 262 286   Cardiac Enzymes: No results for input(s): CKTOTAL, CKMB, CKMBINDEX, TROPONINI in the last 168 hours. BNP: Invalid input(s): POCBNP CBG: No results for input(s): GLUCAP in the last 168 hours. D-Dimer No results for input(s): DDIMER in the last 72 hours. Hgb  A1c No results for input(s): HGBA1C in the last 72 hours. Lipid Profile No results for input(s): CHOL, HDL, LDLCALC, TRIG, CHOLHDL, LDLDIRECT in the last 72 hours. Thyroid function studies No results for input(s): TSH, T4TOTAL, T3FREE, THYROIDAB in the last 72 hours.  Invalid input(s): FREET3 Anemia work up No results for input(s): VITAMINB12, FOLATE, FERRITIN, TIBC, IRON, RETICCTPCT in the last 72 hours. Urinalysis    Component Value Date/Time   COLORURINE YELLOW (A)  04/04/2020 0601   APPEARANCEUR CLEAR (A) 04/04/2020 0601   LABSPEC 1.014 04/04/2020 0601   PHURINE 5.0 04/04/2020 0601   GLUCOSEU NEGATIVE 04/04/2020 0601   HGBUR NEGATIVE 04/04/2020 0601   BILIRUBINUR NEGATIVE 04/04/2020 0601   KETONESUR 20 (A) 04/04/2020 0601   PROTEINUR NEGATIVE 04/04/2020 0601   NITRITE NEGATIVE 04/04/2020 0601   LEUKOCYTESUR TRACE (A) 04/04/2020 0601   Sepsis Labs Invalid input(s): PROCALCITONIN,  WBC,  LACTICIDVEN Microbiology Recent Results (from the past 240 hour(s))  Urine culture     Status: Abnormal   Collection Time: 04/04/20  6:01 AM   Specimen: Urine, Random  Result Value Ref Range Status   Specimen Description   Final    URINE, RANDOM Performed at [facility], [address], [place], [place] [address]    Special Requests   Final    NONE Performed at [facility], [place], [place] [address]    Culture 80,000 COLONIES/mL STAPHYLOCOCCUS EPIDERMIDIS (A)  Final   Report Status 04/06/2020 FINAL  Final   Organism ID, Bacteria STAPHYLOCOCCUS EPIDERMIDIS (A)  Final      Susceptibility   Staphylococcus epidermidis - MIC*    CIPROFLOXACIN <=0.5 SENSITIVE Sensitive     GENTAMICIN <=0.5 SENSITIVE Sensitive     NITROFURANTOIN <=16 SENSITIVE Sensitive     OXACILLIN <=0.25 SENSITIVE Sensitive     TETRACYCLINE <=1 SENSITIVE Sensitive     VANCOMYCIN 1 SENSITIVE Sensitive     TRIMETH/SULFA <=10 SENSITIVE Sensitive     CLINDAMYCIN <=0.25 SENSITIVE Sensitive     RIFAMPIN <=0.5 SENSITIVE Sensitive     Inducible Clindamycin NEGATIVE Sensitive     * 80,000 COLONIES/mL STAPHYLOCOCCUS EPIDERMIDIS  SARS Coronavirus 2 by RT PCR (hospital order, performed in [place] hospital lab) Nasopharyngeal Nasopharyngeal Swab     Status: None   Collection Time: 04/05/20  4:50 AM   Specimen: Nasopharyngeal Swab  Result Value Ref Range Status   SARS Coronavirus 2 NEGATIVE NEGATIVE Final    Comment: (NOTE) SARS-CoV-2 target nucleic acids  are NOT DETECTED.  The SARS-CoV-2 RNA is generally detectable in upper and lower respiratory specimens during the acute phase of infection. The lowest concentration of SARS-CoV-2 viral copies this assay can detect is 250 copies / mL. A negative result does not preclude SARS-CoV-2 infection and should not be used as the sole basis for treatment or other patient management decisions.  A negative result may occur with improper specimen collection / handling, submission of specimen other than nasopharyngeal swab, presence of viral mutation(s) within the areas targeted by this assay, and inadequate number of viral copies (<250 copies / mL). A negative result must be combined with clinical observations, patient history, and epidemiological information.  Fact Sheet for Patients:   StrictlyIdeas.no  Fact Sheet for Healthcare Providers: BankingDealers.co.za  This test is not yet approved or  cleared by the [place] FDA and has been authorized for detection and/or diagnosis of SARS-CoV-2 by FDA under an Emergency Use Authorization (EUA).  This EUA will remain in effect (  meaning this test can be used) for the duration of the COVID-19 declaration under Section 564(b)(1) of the Act, 21 U.S.C. section 360bbb-3(b)(1), unless the authorization is terminated or revoked sooner.  Performed at [facility], [place], [place] [address]   SARS CORONAVIRUS 2 (TAT 6-24 HRS) Nasopharyngeal Nasopharyngeal Swab     Status: None   Collection Time: 04/08/20 12:13 PM   Specimen: Nasopharyngeal Swab  Result Value Ref Range Status   SARS Coronavirus 2 NEGATIVE NEGATIVE Final    Comment: (NOTE) SARS-CoV-2 target nucleic acids are NOT DETECTED.  The SARS-CoV-2 RNA is generally detectable in upper and lower respiratory specimens during the acute phase of infection. Negative results do not preclude SARS-CoV-2 infection, do not rule  out co-infections with other pathogens, and should not be used as the sole basis for treatment or other patient management decisions. Negative results must be combined with clinical observations, patient history, and epidemiological information. The expected result is Negative.  Fact Sheet for Patients: SugarRoll.be  Fact Sheet for Healthcare Providers: https://www.woods-mathews.com/  This test is not yet approved or cleared by the [place] FDA and  has been authorized for detection and/or diagnosis of SARS-CoV-2 by FDA under an Emergency Use Authorization (EUA). This EUA will remain  in effect (meaning this test can be used) for the duration of the COVID-19 declaration under Se ction 564(b)(1) of the Act, 21 U.S.C. section 360bbb-3(b)(1), unless the authorization is terminated or revoked sooner.  Performed at [facility], [place] [address], [place], [place] [address]      Time coordinating discharge: 35 minutes  SIGNED:   Aline August, MD  [facility] 04/09/2020, 9:41 AM

## 2020-04-09 NOTE — Progress Notes (Signed)
Pt discharged to [facility] at 1118 no distress noted transported via stretcher report given to [facility].

## 2020-04-09 NOTE — TOC Transition Note (Signed)
Transition of Care [facility]) - CM/SW Discharge Note   Patient Details  Name: Douglas Silva. MRN: [id number] Date of Birth: 1950/11/07  Transition [facility] CM/SW Contact:  Su Hilt, RN Phone Number: 04/09/2020, 10:50 AM   Clinical Narrative:    The bedside nurse has called report to [facility], The patient is ready, RNCM called EMS for transport to [facility], [name] the patient's sister is aware    Final next level of care: Skilled Nursing Facility Barriers to Discharge: Barriers Resolved   Patient Goals and CMS Choice Patient states their goals for this hospitalization and ongoing recovery are:: want to get well   Choice offered to / list presented to : Patient  Discharge Placement              Patient chooses bed at: [facility] Patient to be transferred to facility by: EMS Name of family member notified: [name] Patient and family notified of of transfer: 04/09/20  Discharge Plan and Services In-house Referral: Clinical Social Work                                   Social Determinants of Health ([place]) Interventions     Readmission Risk Interventions No flowsheet data found.

## 2020-05-13 ENCOUNTER — Ambulatory Visit: Payer: Medicare Other | Admitting: Cardiology

## 2021-11-05 ENCOUNTER — Other Ambulatory Visit: Payer: Self-pay | Admitting: Family Medicine

## 2021-11-05 DIAGNOSIS — L89629 Pressure ulcer of left heel, unspecified stage: Secondary | ICD-10-CM

## 2021-11-13 ENCOUNTER — Encounter: Payer: Self-pay | Admitting: Nurse Practitioner

## 2021-11-13 ENCOUNTER — Non-Acute Institutional Stay: Payer: Medicare Other | Admitting: Nurse Practitioner

## 2021-11-13 VITALS — BP 97/77 | HR 66 | Temp 97.2°F | Resp 18 | Wt 131.5 lb

## 2021-11-13 DIAGNOSIS — Z515 Encounter for palliative care: Secondary | ICD-10-CM

## 2021-11-13 DIAGNOSIS — R630 Anorexia: Secondary | ICD-10-CM

## 2021-11-13 DIAGNOSIS — G20 Parkinson's disease: Secondary | ICD-10-CM

## 2021-11-13 DIAGNOSIS — R531 Weakness: Secondary | ICD-10-CM
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
## 2021-11-16 ENCOUNTER — Encounter: Payer: Self-pay | Admitting: Nurse Practitioner

## 2021-11-16 ENCOUNTER — Other Ambulatory Visit: Payer: Self-pay

## 2021-11-16 ENCOUNTER — Non-Acute Institutional Stay: Payer: Medicare Other | Admitting: Nurse Practitioner

## 2021-11-16 VITALS — HR 86 | Resp 18 | Wt 131.5 lb

## 2021-11-16 DIAGNOSIS — R531 Weakness: Secondary | ICD-10-CM

## 2021-11-16 DIAGNOSIS — R630 Anorexia: Secondary | ICD-10-CM

## 2021-11-16 DIAGNOSIS — Z515 Encounter for palliative care: Secondary | ICD-10-CM

## 2021-11-16 DIAGNOSIS — G20 Parkinson's disease: Secondary | ICD-10-CM

## 2021-11-16 NOTE — Progress Notes (Signed)
[facility] Consult Note Telephone: [phone]  Fax: [phone]    Date of encounter: 11/16/21 4:25 PM PATIENT NAME: Douglas Silva  [id number]   [phone] (home)  DOB: July 15, 1951 MRN: [id number] PRIMARY CARE PROVIDER:    [facility]  RESPONSIBLE PARTY:    Contact Information     Name Relation Home Work [place] Sister   [phone]      I met face to face with patient in facility. Palliative Care was asked to follow this patient by consultation request of  [place] to address advance care planning and complex medical decision making. This is a follow up visit.                                  ASSESSMENT AND PLAN / RECOMMENDATIONS:  Symptom Management/Plan: 1. Advance Care Planning; DNR; Discussed with Douglas Silva, Douglas. Silva, sister HCPOA; Douglas Acton is Hospice eligible per Hospice Physicians review; Discussed with Douglas Silva, in agreement to proceed with Hospice services.   2. Anorexia secondary to severe debility, Parkinson disease, encourage to eat, supplements, comfort foods, continue to feed Douglas Schroeter.    04/09/2020 weight 175 lbs 02/24/2021 weight 146 lbs 08/12/2021 weight 136 lbs 10/22/2021 weight 131.5 lbs BMI 17.8; 14.5 lbs/7 months; 43.5 lbs/2 years; 24.86% loss   3. Generalized weakness/debility severe secondary to Parkinson disease. Discussed importance to turn, position, restorative exercises; focus on comfort   I spent 66 minutes providing this consultation. More than 50% of the time in this consultation was spent in counseling and care coordination.  PPS: 30%  Chief Complaint: Follow up palliative consult for complex medical decision making  HISTORY OF PRESENT ILLNESS:  Douglas Wahlstrom. is a 71 y.o. year old male  with multiple medical problems including parkinson disease with history of autonomic dysfunction and orthostasis  presented with syncope, atrial fib, CAD, cardiomyopathy, HTN, hypothyroidism, h/o falls, h/o UTI's, BPH. Douglas Haymond is LTC [facility] at [facility] since 2021. Douglas. Beier has progressed to total care for mobility, turning, position, transfers, bathing, dressing, incontinence bowel and bladder, requires to be fed with significant weight loss, see above. Douglas Rocca per staff has difficulty communicating his needs. At present Douglas Coots is lying in bed, severely debilitated, having tremors of bilateral hands, upper arms. Douglas. Frutiger appears chronically ill, makes eye contact, very difficulty to understand speech. Douglas Graffam and I talked about how he has been feeling. Douglas. Kiedrowski endorses "okay", denies any complaints. Douglas Moga was cooperative with assessment. Douglas. Mikhail and I talked about having an additional team to help care for him at [facility]. We talked about discussion with Douglas Silva, his sister. We talked about quality of life. Emotional support provided. I called Douglas Silva, clinical update given, discussed at length about Hospice eligibility, services provided. We talked about quality of life. Douglas Silva endorses wishes to proceed with Hospice. We talked about process. Updated Abran Cantor, PA/primary for Hospice order, notified facility.   History obtained from review of EMR, discussion with primary team, and interview with family, facility staff and Douglas. Statzer.  I reviewed available labs, medications, imaging, studies and related documents from the EMR.  Records reviewed and summarized above.   ROS 10 point system reviewed with facility staff and Douglas Sagan all negative except HPI  Physical Exam: Constitutional: NAD General: frail appearing, thin, debilitated, chronically ill male  EYES: lids intact ENMT: oral mucous membranes moist CV: S1S2, RRR Pulmonary: LCTA, no increased work of breathing, no cough, room air Abdomen: normo-active BS + 4 quadrants, soft and non tender MSK: functional  quadriplegic; +muscle wasting Skin: warm and dry Neuro:  + generalized weakness,  + cognitive impairment Psych: non-anxious affect, A and O x 2 Thank you for the opportunity to participate in the care of Douglas. Mcadam.  The palliative care team will continue to follow. Please call our office at [phone] if we can be of additional assistance.   This chart was dictated using voice recognition software.  Despite best efforts to proofread,  errors can occur which can change the documentation meaning.   Questions and concerns were addressed.  Provided general support and encouragement, no other unmet needs identified   Darnette Lampron Ihor Gully, NP

## 2021-11-17 ENCOUNTER — Inpatient Hospital Stay
Admission: EM | Admit: 2021-11-17 | Discharge: 2021-11-24 | DRG: 474 | Disposition: A | Discharge status: Skilled Nursing Facility | Payer: Medicare Other | Source: Skilled Nursing Facility | Attending: Internal Medicine | Admitting: Internal Medicine

## 2021-11-17 ENCOUNTER — Ambulatory Visit: Payer: Medicare Other

## 2021-11-17 ENCOUNTER — Emergency Department: Payer: Medicare Other

## 2021-11-17 ENCOUNTER — Other Ambulatory Visit: Payer: Self-pay

## 2021-11-17 DIAGNOSIS — I96 Gangrene, not elsewhere classified: Secondary | ICD-10-CM | POA: Diagnosis present

## 2021-11-17 DIAGNOSIS — Z79890 Hormone replacement therapy (postmenopausal): Secondary | ICD-10-CM | POA: Diagnosis not present

## 2021-11-17 DIAGNOSIS — E785 Hyperlipidemia, unspecified: Secondary | ICD-10-CM | POA: Diagnosis present

## 2021-11-17 DIAGNOSIS — Z800 Family history of malignant neoplasm of digestive organs: Secondary | ICD-10-CM

## 2021-11-17 DIAGNOSIS — Z801 Family history of malignant neoplasm of trachea, bronchus and lung: Secondary | ICD-10-CM | POA: Diagnosis not present

## 2021-11-17 DIAGNOSIS — B9561 Methicillin susceptible Staphylococcus aureus infection as the cause of diseases classified elsewhere: Secondary | ICD-10-CM | POA: Diagnosis present

## 2021-11-17 DIAGNOSIS — N400 Enlarged prostate without lower urinary tract symptoms: Secondary | ICD-10-CM | POA: Diagnosis present

## 2021-11-17 DIAGNOSIS — G909 Disorder of the autonomic nervous system, unspecified: Secondary | ICD-10-CM | POA: Diagnosis present

## 2021-11-17 DIAGNOSIS — Z792 Long term (current) use of antibiotics: Secondary | ICD-10-CM

## 2021-11-17 DIAGNOSIS — I10 Essential (primary) hypertension: Secondary | ICD-10-CM | POA: Diagnosis present

## 2021-11-17 DIAGNOSIS — Z79899 Other long term (current) drug therapy: Secondary | ICD-10-CM | POA: Diagnosis not present

## 2021-11-17 DIAGNOSIS — G20 Parkinson's disease: Secondary | ICD-10-CM | POA: Diagnosis present

## 2021-11-17 DIAGNOSIS — M86071 Acute hematogenous osteomyelitis, right ankle and foot: Secondary | ICD-10-CM

## 2021-11-17 DIAGNOSIS — G8250 Quadriplegia, unspecified: Secondary | ICD-10-CM | POA: Diagnosis present

## 2021-11-17 DIAGNOSIS — M86072 Acute hematogenous osteomyelitis, left ankle and foot: Principal | ICD-10-CM | POA: Diagnosis present

## 2021-11-17 DIAGNOSIS — L02612 Cutaneous abscess of left foot: Secondary | ICD-10-CM | POA: Diagnosis present

## 2021-11-17 DIAGNOSIS — E440 Moderate protein-calorie malnutrition: Secondary | ICD-10-CM | POA: Diagnosis present

## 2021-11-17 DIAGNOSIS — I2510 Atherosclerotic heart disease of native coronary artery without angina pectoris: Secondary | ICD-10-CM | POA: Diagnosis present

## 2021-11-17 DIAGNOSIS — E039 Hypothyroidism, unspecified: Secondary | ICD-10-CM | POA: Diagnosis present

## 2021-11-17 DIAGNOSIS — L089 Local infection of the skin and subcutaneous tissue, unspecified: Secondary | ICD-10-CM | POA: Diagnosis not present

## 2021-11-17 DIAGNOSIS — I480 Paroxysmal atrial fibrillation: Secondary | ICD-10-CM | POA: Diagnosis present

## 2021-11-17 DIAGNOSIS — F0280 Dementia in other diseases classified elsewhere without behavioral disturbance: Secondary | ICD-10-CM | POA: Diagnosis present

## 2021-11-17 DIAGNOSIS — L89153 Pressure ulcer of sacral region, stage 3: Secondary | ICD-10-CM | POA: Diagnosis present

## 2021-11-17 DIAGNOSIS — M86172 Other acute osteomyelitis, left ankle and foot: Secondary | ICD-10-CM | POA: Diagnosis not present

## 2021-11-17 DIAGNOSIS — E876 Hypokalemia: Secondary | ICD-10-CM | POA: Diagnosis present

## 2021-11-17 DIAGNOSIS — L89329 Pressure ulcer of left buttock, unspecified stage: Secondary | ICD-10-CM | POA: Diagnosis present

## 2021-11-17 DIAGNOSIS — D72828 Other elevated white blood cell count: Secondary | ICD-10-CM | POA: Diagnosis present

## 2021-11-17 DIAGNOSIS — Z7901 Long term (current) use of anticoagulants: Secondary | ICD-10-CM

## 2021-11-17 DIAGNOSIS — L89319 Pressure ulcer of right buttock, unspecified stage: Secondary | ICD-10-CM | POA: Diagnosis present

## 2021-11-17 DIAGNOSIS — I459 Conduction disorder, unspecified: Secondary | ICD-10-CM | POA: Diagnosis present

## 2021-11-17 DIAGNOSIS — F02818 Dementia in other diseases classified elsewhere, unspecified severity, with other behavioral disturbance: Secondary | ICD-10-CM | POA: Diagnosis not present

## 2021-11-17 DIAGNOSIS — Z7189 Other specified counseling: Secondary | ICD-10-CM | POA: Diagnosis not present

## 2021-11-17 DIAGNOSIS — Z20822 Contact with and (suspected) exposure to covid-19: Secondary | ICD-10-CM | POA: Diagnosis present

## 2021-11-17 DIAGNOSIS — Z9861 Coronary angioplasty status: Secondary | ICD-10-CM

## 2021-11-17 DIAGNOSIS — Z66 Do not resuscitate: Secondary | ICD-10-CM | POA: Diagnosis present

## 2021-11-17 DIAGNOSIS — D6489 Other specified anemias: Secondary | ICD-10-CM | POA: Diagnosis present

## 2021-11-17 DIAGNOSIS — I429 Cardiomyopathy, unspecified: Secondary | ICD-10-CM | POA: Diagnosis present

## 2021-11-17 DIAGNOSIS — Z6820 Body mass index (BMI) 20.0-20.9, adult: Secondary | ICD-10-CM

## 2021-11-17 DIAGNOSIS — L97529 Non-pressure chronic ulcer of other part of left foot with unspecified severity: Secondary | ICD-10-CM | POA: Diagnosis present

## 2021-11-17 DIAGNOSIS — Z515 Encounter for palliative care: Secondary | ICD-10-CM | POA: Diagnosis not present

## 2021-11-17 DIAGNOSIS — M868X7 Other osteomyelitis, ankle and foot: Secondary | ICD-10-CM

## 2021-11-17 LAB — CBC WITH DIFFERENTIAL/PLATELET
Abs Immature Granulocytes: 0.06 10*3/uL (ref 0.00–0.07)
Basophils Absolute: 0.1 10*3/uL (ref 0.0–0.1)
Basophils Relative: 1 %
Eosinophils Absolute: 0.2 10*3/uL (ref 0.0–0.5)
Eosinophils Relative: 2 %
HCT: 39.3 % (ref 39.0–52.0)
Hemoglobin: 12.8 g/dL — ABNORMAL LOW (ref 13.0–17.0)
Immature Granulocytes: 1 %
Lymphocytes Relative: 15 %
Lymphs Abs: 1.6 10*3/uL (ref 0.7–4.0)
MCH: 31.1 pg (ref 26.0–34.0)
MCHC: 32.6 g/dL (ref 30.0–36.0)
MCV: 95.6 fL (ref 80.0–100.0)
Monocytes Absolute: 0.9 10*3/uL (ref 0.1–1.0)
Monocytes Relative: 9 %
Neutro Abs: 7.9 10*3/uL — ABNORMAL HIGH (ref 1.7–7.7)
Neutrophils Relative %: 72 %
Platelets: 300 10*3/uL (ref 150–400)
RBC: 4.11 MIL/uL — ABNORMAL LOW (ref 4.22–5.81)
RDW: 14.1 % (ref 11.5–15.5)
WBC: 10.7 10*3/uL — ABNORMAL HIGH (ref 4.0–10.5)
nRBC: 0 % (ref 0.0–0.2)

## 2021-11-17 LAB — RESP PANEL BY RT-PCR (FLU A&B, COVID) ARPGX2
Influenza A by PCR: NEGATIVE
Influenza B by PCR: NEGATIVE
SARS Coronavirus 2 by RT PCR: NEGATIVE

## 2021-11-17 LAB — COMPREHENSIVE METABOLIC PANEL
ALT: 60 U/L — ABNORMAL HIGH (ref 0–44)
AST: 158 U/L — ABNORMAL HIGH (ref 15–41)
Albumin: 2.5 g/dL — ABNORMAL LOW (ref 3.5–5.0)
Alkaline Phosphatase: 65 U/L (ref 38–126)
Anion gap: 5 (ref 5–15)
BUN: 18 mg/dL (ref 8–23)
CO2: 29 mmol/L (ref 22–32)
Calcium: 8.3 mg/dL — ABNORMAL LOW (ref 8.9–10.3)
Chloride: 103 mmol/L (ref 98–111)
Creatinine, Ser: 0.51 mg/dL — ABNORMAL LOW (ref 0.61–1.24)
GFR, Estimated: >60 mL/min (ref >60)
Glucose, Bld: 125 mg/dL — ABNORMAL HIGH (ref 70–99)
Potassium: 2.9 mmol/L — ABNORMAL LOW (ref 3.5–5.1)
Sodium: 137 mmol/L (ref 135–145)
Total Bilirubin: 0.7 mg/dL (ref 0.3–1.2)
Total Protein: 5.7 g/dL — ABNORMAL LOW (ref 6.5–8.1)

## 2021-11-17 LAB — PROTIME-INR
INR: 1.5 — ABNORMAL HIGH (ref 0.8–1.2)
Prothrombin Time: 17.6 seconds — ABNORMAL HIGH (ref 11.4–15.2)

## 2021-11-17 LAB — APTT: aPTT: 34 seconds (ref 24–36)

## 2021-11-17 LAB — LACTIC ACID, PLASMA: Lactic Acid, Venous: 1.3 mmol/L (ref 0.5–1.9)

## 2021-11-17 MED ORDER — VANCOMYCIN HCL 1500 MG/300ML IV SOLN
1500.0000 mg | Freq: Two times a day (BID) | INTRAVENOUS | Status: DC
  Filled 2021-11-17: qty 300

## 2021-11-17 MED ORDER — ONDANSETRON HCL 4 MG/2ML IJ SOLN: 4.0000 mg | Freq: Four times a day (QID) | INTRAMUSCULAR | Status: DC | PRN

## 2021-11-17 MED ORDER — POTASSIUM CHLORIDE IN NACL 20-0.9 MEQ/L-% IV SOLN
INTRAVENOUS | Status: DC
  Filled 2021-11-17 (×8): qty 1000

## 2021-11-17 MED ORDER — ONDANSETRON HCL 4 MG PO TABS: 4.0000 mg | ORAL_TABLET | Freq: Four times a day (QID) | ORAL | Status: DC | PRN

## 2021-11-17 MED ORDER — VANCOMYCIN HCL 500 MG/100ML IV SOLN
500.0000 mg | Freq: Once | INTRAVENOUS | Status: AC
  Administered 2021-11-17: 22:00:00 500 mg via INTRAVENOUS
  Filled 2021-11-17: qty 100

## 2021-11-17 MED ORDER — PIPERACILLIN-TAZOBACTAM 3.375 G IVPB 30 MIN: 3.3750 g | Freq: Four times a day (QID) | INTRAVENOUS | Status: DC

## 2021-11-17 MED ORDER — MAGNESIUM HYDROXIDE 400 MG/5ML PO SUSP: 30.0000 mL | Freq: Every day | ORAL | Status: DC | PRN

## 2021-11-17 MED ORDER — METOPROLOL TARTRATE 25 MG PO TABS
12.5000 mg | ORAL_TABLET | Freq: Two times a day (BID) | ORAL | Status: DC
  Administered 2021-11-17 – 2021-11-23 (×8): 12.5 mg via ORAL
  Filled 2021-11-17 (×15): qty 1

## 2021-11-17 MED ORDER — POVIDONE-IODINE 10 % EX SWAB: 2.0000 "application " | Freq: Once | CUTANEOUS | Status: DC

## 2021-11-17 MED ORDER — CHLORHEXIDINE GLUCONATE 4 % EX LIQD: 60.0000 mL | Freq: Once | CUTANEOUS | Status: DC

## 2021-11-17 MED ORDER — VANCOMYCIN HCL IN DEXTROSE 1-5 GM/200ML-% IV SOLN: 1000.0000 mg | Freq: Once | INTRAVENOUS | Status: DC

## 2021-11-17 MED ORDER — ACETAMINOPHEN 325 MG PO TABS
650.0000 mg | ORAL_TABLET | Freq: Four times a day (QID) | ORAL | Status: DC | PRN
  Administered 2021-11-18 – 2021-11-21 (×3): 650 mg via ORAL
  Filled 2021-11-17 (×3): qty 2

## 2021-11-17 MED ORDER — SODIUM CHLORIDE 0.9 % IV SOLN
2.0000 g | Freq: Once | INTRAVENOUS | Status: AC
  Administered 2021-11-17: 15:00:00 2 g via INTRAVENOUS
  Filled 2021-11-17: qty 2

## 2021-11-17 MED ORDER — DONEPEZIL HCL 5 MG PO TABS
10.0000 mg | ORAL_TABLET | Freq: Every day | ORAL | Status: DC
  Administered 2021-11-19 – 2021-11-24 (×6): 10 mg via ORAL
  Filled 2021-11-17 (×7): qty 2

## 2021-11-17 MED ORDER — METRONIDAZOLE 500 MG/100ML IV SOLN
500.0000 mg | Freq: Once | INTRAVENOUS | Status: AC
  Administered 2021-11-17: 15:00:00 500 mg via INTRAVENOUS
  Filled 2021-11-17: qty 100

## 2021-11-17 MED ORDER — SODIUM CHLORIDE 0.9 % IV SOLN: 2.0000 g | Freq: Once | INTRAVENOUS | Status: DC

## 2021-11-17 MED ORDER — MIDODRINE HCL 5 MG PO TABS
5.0000 mg | ORAL_TABLET | Freq: Three times a day (TID) | ORAL | Status: DC
  Administered 2021-11-19 – 2021-11-24 (×14): 5 mg via ORAL
  Filled 2021-11-17 (×16): qty 1

## 2021-11-17 MED ORDER — LEVOTHYROXINE SODIUM 88 MCG PO TABS: 88.0000 ug | ORAL_TABLET | Freq: Every day | ORAL | Status: DC

## 2021-11-17 MED ORDER — SODIUM CHLORIDE 0.9 % IV BOLUS
500.0000 mL | Freq: Once | INTRAVENOUS | Status: AC
  Administered 2021-11-17: 15:00:00 500 mL via INTRAVENOUS

## 2021-11-17 MED ORDER — ACETAMINOPHEN 650 MG RE SUPP: 650.0000 mg | Freq: Four times a day (QID) | RECTAL | Status: DC | PRN

## 2021-11-17 MED ORDER — SIMVASTATIN 20 MG PO TABS
20.0000 mg | ORAL_TABLET | Freq: Every day | ORAL | Status: DC
  Administered 2021-11-17 – 2021-11-23 (×7): 20 mg via ORAL
  Filled 2021-11-17 (×5): qty 1
  Filled 2021-11-17: qty 2
  Filled 2021-11-17: qty 1
  Filled 2021-11-17: qty 2

## 2021-11-17 MED ORDER — CARBIDOPA-LEVODOPA 25-100 MG PO TABS
1.0000 | ORAL_TABLET | Freq: Four times a day (QID) | ORAL | Status: DC
  Administered 2021-11-17 – 2021-11-24 (×22): 1 via ORAL
  Filled 2021-11-17 (×23): qty 1

## 2021-11-17 MED ORDER — VITAMIN D (ERGOCALCIFEROL) 1.25 MG (50000 UNIT) PO CAPS
50000.0000 [IU] | ORAL_CAPSULE | ORAL | Status: DC
  Filled 2021-11-17: qty 1

## 2021-11-17 MED ORDER — LEVOTHYROXINE SODIUM 50 MCG PO TABS
100.0000 ug | ORAL_TABLET | Freq: Every day | ORAL | Status: DC
  Administered 2021-11-18 – 2021-11-24 (×6): 100 ug via ORAL
  Filled 2021-11-17 (×6): qty 2

## 2021-11-17 MED ORDER — PIPERACILLIN-TAZOBACTAM 3.375 G IVPB
3.3750 g | Freq: Three times a day (TID) | INTRAVENOUS | Status: DC
  Administered 2021-11-17 – 2021-11-18 (×2): 3.375 g via INTRAVENOUS
  Filled 2021-11-17 (×2): qty 50

## 2021-11-17 MED ORDER — LACTATED RINGERS IV SOLN: INTRAVENOUS | Status: DC

## 2021-11-17 MED ORDER — AMIODARONE HCL 200 MG PO TABS
200.0000 mg | ORAL_TABLET | Freq: Every day | ORAL | Status: DC
  Administered 2021-11-19 – 2021-11-24 (×6): 200 mg via ORAL
  Filled 2021-11-17 (×7): qty 1

## 2021-11-17 MED ORDER — VANCOMYCIN HCL IN DEXTROSE 1-5 GM/200ML-% IV SOLN
1000.0000 mg | Freq: Once | INTRAVENOUS | Status: AC
  Administered 2021-11-17: 15:00:00 1000 mg via INTRAVENOUS
  Filled 2021-11-17: qty 200

## 2021-11-17 MED ORDER — VANCOMYCIN HCL IN DEXTROSE 1-5 GM/200ML-% IV SOLN
1000.0000 mg | Freq: Two times a day (BID) | INTRAVENOUS | Status: DC
  Administered 2021-11-18 – 2021-11-20 (×4): 1000 mg via INTRAVENOUS
  Filled 2021-11-17 (×9): qty 200

## 2021-11-17 MED ORDER — TRAZODONE HCL 50 MG PO TABS: 25.0000 mg | ORAL_TABLET | Freq: Every evening | ORAL | Status: DC | PRN

## 2021-11-17 NOTE — ED Provider Notes (Signed)
[facility] Provider Note    Event Date/Time   First MD Initiated Contact with Patient 11/17/21 1359     (approximate)   History   Wound Check   HPI  Douglas Silva. is a 71 y.o. male quadriplegic presents to the ER from [facility] due to wound that they feel are no longer able to care for.  He is on chronic antibiotics at the facility.  History obtained from documentation as unable to reach staff at facility and patient unable to provide any additional history.  Documentation states that they are unable to care for the patient's left foot wound.     Physical Exam   Triage Vital Signs: ED Triage Vitals  Enc Vitals Group     BP 11/17/21 1356 96/65     Pulse Rate 11/17/21 1356 (!) 53     Resp 11/17/21 1356 18     Temp 11/17/21 1356 97.6 F (36.4 C)     Temp Source 11/17/21 1356 Oral     SpO2 11/17/21 1356 96 %     Weight 11/17/21 1354 151 lb 4.8 oz (68.6 kg)     Height 11/17/21 1354 6' (1.829 m)     Head Circumference --      Peak Flow --      Pain Score --      Pain Loc --      Pain Edu? --      Excl. in [place]? --     Most recent vital signs: Vitals:   11/17/21 1500 11/17/21 1507  BP: (!) 86/56 (!) 91/57  Pulse: (!) 54 (!) 54  Resp: 14 16  Temp:    SpO2: 94% 100%     Constitutional: chronically ill appearing Eyes: Conjunctivae are normal.  Head: Atraumatic. Nose: No congestion/rhinnorhea. Mouth/Throat: Mucous membranes are moist.   Neck: Painless ROM.  Cardiovascular:   Good peripheral circulation. Respiratory: Normal respiratory effort.  No retractions.  Gastrointestinal: Soft and nontender.  Musculoskeletal: Erythema fluctuance wound to the left lateral foot foul-smelling purulent discharge. Neurologic:  quadriplegia Skin:  Skin is warm, dry and intact. No rash noted. Psychiatric: Mood and affect are normal. Speech and behavior are normal.    ED Results / Procedures / Treatments   Labs (all labs ordered are  listed, but only abnormal results are displayed) Labs Reviewed  COMPREHENSIVE METABOLIC PANEL - Abnormal; Notable for the following components:      Result Value   Potassium 2.9 (*)    Glucose, Bld 125 (*)    Creatinine, Ser 0.51 (*)    Calcium 8.3 (*)    Total Protein 5.7 (*)    Albumin 2.5 (*)    AST 158 (*)    ALT 60 (*)    All other components within normal limits  CBC WITH DIFFERENTIAL/PLATELET - Abnormal; Notable for the following components:   WBC 10.7 (*)    RBC 4.11 (*)    Hemoglobin 12.8 (*)    Neutro Abs 7.9 (*)    All other components within normal limits  PROTIME-INR - Abnormal; Notable for the following components:   Prothrombin Time 17.6 (*)    INR 1.5 (*)    All other components within normal limits  RESP PANEL BY RT-PCR (FLU A&B, COVID) ARPGX2  CULTURE, BLOOD (ROUTINE X 2)  CULTURE, BLOOD (ROUTINE X 2)  LACTIC ACID, PLASMA  APTT     EKG  ED ECG REPORT I, Merlyn Lot, the attending physician, personally  viewed and interpreted this ECG.   Date: 11/17/2021  EKG Time: 13:56  Rate: 55  Rhythm: afib  Axis: normal  Intervals:normal intervals  ST&T Change: nonspecific st abnm, no stemi    RADIOLOGY Please see ED Course for my review and interpretation.  I personally reviewed all radiographic images ordered to evaluate for the above acute complaints and reviewed radiology reports and findings.  These findings were personally discussed with the patient.  Please see medical record for radiology report.    PROCEDURES:  Critical Care performed:   Procedures   MEDICATIONS ORDERED IN ED: Medications  lactated ringers infusion ( Intravenous New Bag/Given 11/17/21 1439)  metroNIDAZOLE (FLAGYL) IVPB 500 mg (has no administration in time range)  vancomycin (VANCOCIN) IVPB 1000 mg/200 mL premix (has no administration in time range)  sodium chloride 0.9 % bolus 500 mL (has no administration in time range)  ceFEPIme (MAXIPIME) 2 g in sodium chloride  0.9 % 100 mL IVPB (0 g Intravenous Stopped 11/17/21 1512)     IMPRESSION / MDM / ASSESSMENT AND PLAN / ED COURSE  I reviewed the triage vital signs and the nursing notes.                              Differential diagnosis includes, but is not limited to, sepsis, cellulitis, osteo-, necrotizing soft tissue infection, fracture, contusion  Patient with extensive past medical history presented the ER with wound of left foot.  Concerning for osteo or deep space infection septic work-up ordered.  Anticipate patient will require hospitalization given the extent of his wound and severity.   Clinical Course as of 11/17/21 1520  Tue Nov 17, 2021  1441 X-ray per my review does appear consistent with osteo-. [PR]  7116 Patient does have leukocytosis.  INR 1.5.  Does have a mild hypokalemia to 2.9. [PR]  1519 I consulted with hospitalist who is kindly excepted patient to their service. [PR]    Clinical Course User Index [PR] Merlyn Lot, MD     FINAL CLINICAL IMPRESSION(S) / ED DIAGNOSES   Final diagnoses:  Other osteomyelitis of left foot ([place])     Rx / DC Orders   ED Discharge Orders     None        Note:  This document was prepared using Dragon voice recognition software and may include unintentional dictation errors.    Merlyn Lot, MD 11/17/21 1520

## 2021-11-17 NOTE — Consult Note (Signed)
Pharmacy Antibiotic Note  Douglas Silva. is a 71 y.o. male admitted on 11/17/2021 with cellulitis.  Pharmacy has been consulted for pip/tazo and vancomycin dosing.  Plan: Started pip/tazo 3.375 mg q8H   Pt will get a vancomycin load of 1500 mg x 1 in the ED followed up 1000 mg q12H. Predicted AUC of 483, Goal AUC 400-550. Plan to obtain vancomycin level after 4th or 5th dose. Scr used 0.8, Vd 0.72.   Height: 6' (182.9 cm) Weight: 68.6 kg (151 lb 4.8 oz) IBW/kg (Calculated) : 77.6  Temp (24hrs), Avg:97.6 F (36.4 C), Min:97.6 F (36.4 C), Max:97.6 F (36.4 C)  Recent Labs  Lab 11/17/21 1406  WBC 10.7*  CREATININE 0.51*  LATICACIDVEN 1.3    Estimated Creatinine Clearance: 84.6 mL/min (A) (by C-G formula based on SCr of 0.51 mg/dL (L)).    Allergies  Allergen Reactions   No Known Allergies     Antimicrobials this admission: 1/24 vancomycin >>  1/24 pip/tazo >>   Dose adjustments this admission: None   Microbiology results: 1/24 BCx: pending  Thank you for allowing pharmacy to be a part of this patients care.  Oswald Hillock 11/17/2021 4:12 PM

## 2021-11-17 NOTE — Consult Note (Signed)
PHARMACY -  BRIEF ANTIBIOTIC NOTE   Pharmacy has received consult(s) for cellulitis from an ED provider.  The patient's profile has been reviewed for ht/wt/allergies/indication/available labs.    One time order(s) placed for cefepime and vancomycin   Further antibiotics/pharmacy consults should be ordered by admitting physician if indicated.                       Thank you, Oswald Hillock 11/17/2021  2:27 PM

## 2021-11-17 NOTE — Progress Notes (Signed)
[facility]) Hospital Liaison note:  This patient is currently enrolled in [facility] outpatient-based Palliative Care. Will continue to follow for disposition.  Please call with any outpatient palliative questions or concerns.  Thank you, Lorelee Market, LPN [facility] Liaison [phone]

## 2021-11-17 NOTE — ED Triage Notes (Signed)
Pt here via [facility] from [facility]. Pt quadriplegic at baseline, Medic reports pt has wound on L foot that cannot be treated at facility.

## 2021-11-17 NOTE — ED Notes (Signed)
Verbal report called to Silverio Decamp RN at this time.

## 2021-11-17 NOTE — H&P (Signed)
[place]   PATIENT NAME: Douglas Silva    MR#:  [id number]  DATE OF BIRTH:  Jul 25, 1951  DATE OF ADMISSION:  11/17/2021  PRIMARY CARE PHYSICIAN: Remi Haggard, FNP   Patient is coming from: Home  REQUESTING/REFERRING PHYSICIAN: Merlyn Lot, MD  CHIEF COMPLAINT:   Chief Complaint  Patient presents with   Wound Check    HISTORY OF PRESENT ILLNESS:  Douglas Silva. is a 71 y.o. Caucasian male with medical history significant for atrial fibrillation, BPH, CAD, hypothyroidism, cardiomyopathy, BPH and Parkinson's, who presented to the emergency room from [place] with left foot worsening wound despite chronic antibiotics at the facility with inability to care for the wound anymore.  No reported fever or chills.  No reported nausea or vomiting or abdominal pain.  No reported chest pain or dyspnea or cough or wheezing or hemoptysis.  No reported urinary symptoms.  ED Course: When he came to the ER BP was 97/77 with a temperature of 97.2 with otherwise normal vital signs.  Labs reviewed hypokalemia of 2.9 with elevated LFTs.  CBC showed WBC of 10.7 with anemia.  Influenza antigens and COVID-19 PCR came back negative.  Blood cultures were drawn. EKG as reviewed by me : EKG showed atrial fibrillation with slow ventricular sponsor 55 with nonspecific intraventricular conduction delay. Imaging: Left foot x-ray showed evidence of soft tissue infection/wound of the lateral aspect of the foot with underlying acute osteomyelitis involving the distal fifth metatarsal at fifth proximal phalanx  The patient was given IV cefepime, vancomycin and Flagyl initially.  He will be admitted to a medical bed for further evaluation and management. PAST MEDICAL HISTORY:   Past Medical History:  Diagnosis Date   Atrial fibrillation (HCC)    BPH (benign prostatic hyperplasia)    Cardiomyopathy ([place])    Coronary artery disease    Hypertension    Hypothyroidism     Parkinson's disease ([place])     PAST SURGICAL HISTORY:   Past Surgical History:  Procedure Laterality Date   COLONOSCOPY N/A 04/10/2015   Procedure: COLONOSCOPY;  Surgeon: Manya Silvas, MD;  Location: [facility];  Service: Endoscopy;  Laterality: N/A;   CORONARY ANGIOPLASTY     DIALYSIS/PERMA CATHETER INSERTION N/A 09/05/2017   Procedure: DIALYSIS/PERMA CATHETER INSERTION;  Surgeon: Algernon Huxley, MD;  Location: [facility];  Service: Cardiovascular;  Laterality: N/A;    SOCIAL HISTORY:   Social History   Tobacco Use   Smoking status: Never   Smokeless tobacco: Never  Substance Use Topics   Alcohol use: No    FAMILY HISTORY:   Family History  Problem Relation Age of Onset   Lung cancer Mother    Esophageal cancer Father     DRUG ALLERGIES:   Allergies  Allergen Reactions   No Known Allergies     REVIEW OF SYSTEMS:   ROS As per history of present illness. All pertinent systems were reviewed above. Constitutional, HEENT, cardiovascular, respiratory, GI, GU, musculoskeletal, neuro, psychiatric, endocrine, integumentary and hematologic systems were reviewed and are otherwise negative/unremarkable except for positive findings mentioned above in the HPI.   MEDICATIONS AT HOME:   Prior to Admission medications   Medication Sig Start Date End Date Taking? Authorizing Provider  acetaminophen (TYLENOL) 325 MG tablet Take 650 mg by mouth every 6 (six) hours.    [provider]  amiodarone (PACERONE) 200 MG tablet 200 mg twice a day till 04/11/2020 then once a day  04/09/20   Aline August, MD  carbidopa-levodopa (SINEMET IR) 25-100 MG tablet Take 1 tablet by mouth 4 (four) times daily. 04/09/20   Aline August, MD  donepezil (ARICEPT) 10 MG tablet Take 10 mg by mouth daily. 03/18/20   [provider]  ibuprofen (ADVIL) 400 MG tablet Take 1 tablet (400 mg total) by mouth every 8 (eight) hours as needed for mild pain or moderate pain. 04/09/20    Aline August, MD  levothyroxine (SYNTHROID) 88 MCG tablet Take 88 mcg by mouth daily before breakfast.     [provider]  metoprolol tartrate (LOPRESSOR) 25 MG tablet Take 0.5 tablets (12.5 mg total) by mouth 2 (two) times daily. 04/09/20   Aline August, MD  midodrine (PROAMATINE) 5 MG tablet Take 1 tablet (5 mg total) by mouth 3 (three) times daily with meals. 04/09/20   Aline August, MD  simvastatin (ZOCOR) 20 MG tablet Take 1 tablet (20 mg total) by mouth daily. Patient taking differently: Take 20 mg by mouth at bedtime.  09/23/17   Fritzi Mandes, MD  Vitamin D, Ergocalciferol, (DRISDOL) 1.25 MG (50000 UNIT) CAPS capsule Take 50,000 Units by mouth every 7 (seven) days.    [provider]  warfarin (COUMADIN) 3 MG tablet Take 1 tablet (3 mg total) by mouth one time only at 4 PM. 04/09/20   Aline August, MD      VITAL SIGNS:  Blood pressure (!) 91/57, pulse (!) 54, temperature 97.6 F (36.4 C), temperature source Oral, resp. rate 16, height 6' (1.829 m), weight 68.6 kg, SpO2 100 %.  PHYSICAL EXAMINATION:  Physical Exam  GENERAL:  71 y.o.-year-old Caucasian patient lying in the bed with no acute distress.  EYES: Pupils equal, round, reactive to light and accommodation. No scleral icterus. Extraocular muscles intact.  HEENT: Head atraumatic, normocephalic. Oropharynx and nasopharynx clear.  NECK:  Supple, no jugular venous distention. No thyroid enlargement, no tenderness.  LUNGS: Normal breath sounds bilaterally, no wheezing, rales,rhonchi or crepitation. No use of accessory muscles of respiration.  CARDIOVASCULAR: Regular rate and rhythm, S1, S2 normal. No murmurs, rubs, or gallops.  ABDOMEN: Soft, nondistended, nontender. Bowel sounds present. No organomegaly or mass.  EXTREMITIES: No pitting leg edema, cyanosis, or clubbing.  NEUROLOGIC: The patient is quadriplegic. PSYCHIATRIC: The patient is alert and oriented x 3.  Normal affect and good eye contact. SKIN:  Left lateral dorsal foot erythema, warmth with fluctuance with foul-smelling purulent discharge from a plantar wound.     LABORATORY PANEL:   CBC Recent Labs  Lab 11/17/21 1406  WBC 10.7*  HGB 12.8*  HCT 39.3  PLT 300   ------------------------------------------------------------------------------------------------------------------  Chemistries  Recent Labs  Lab 11/17/21 1406  NA 137  K 2.9*  CL 103  CO2 29  GLUCOSE 125*  BUN 18  CREATININE 0.51*  CALCIUM 8.3*  AST 158*  ALT 60*  ALKPHOS 65  BILITOT 0.7   ------------------------------------------------------------------------------------------------------------------  Cardiac Enzymes No results for input(s): TROPONINI in the last 168 hours. ------------------------------------------------------------------------------------------------------------------  RADIOLOGY:  DG Foot Complete Left  Result Date: 11/17/2021 CLINICAL DATA:  Foot infection, wound EXAM: LEFT FOOT - COMPLETE 3+ VIEW COMPARISON:  None. FINDINGS: Soft tissue swelling and subcutaneous lucencies identified at the lateral aspect of the foot centered near the distal fifth metatarsal, consistent with soft tissue wound/infection. Destructive appearance of the fifth metatarsal head and base of the fifth proximal phalanx, with associated pathologic fracture at the metatarsal head. Bones are osteopenic. Mild degenerative changes at the first metatarsophalangeal  joint noted. IMPRESSION: Evidence of soft tissue infection/wound of the lateral aspect of the foot with underlying acute osteomyelitis involving the distal fifth metatarsal and fifth proximal phalanx. Electronically Signed   By: Ofilia Neas M.D.   On: 11/17/2021 14:49      IMPRESSION AND PLAN:   1.  Acute osteomyelitis of the left foot. - The patient will be admitted to a medical bed. - We will continue antibiotic therapy with IV vancomycin and Zosyn. - Podiatry consult will be obtained. - I  discussed the case with Dr. Vickki Muff.  2.  Paroxysmal atrial fibrillation currently rate controlled ventricular response. - We will continue amiodarone. - We will hold off anticoagulation.  3.  Hypothyroidism. - We will continue Synthroid.  4.  Dementia and Parkinson's disease. - We will continue Sinemet ER and Aricept.  5.  Coronary artery disease. - We will continue Lopressor and statin therapy.  6.  Dyslipidemia. - We will continue statin therapy.  DVT prophylaxis: SCDs. Code Status: full code.  Family Communication:  The plan of care was discussed in details with the patient (and family). I answered all questions. The patient agreed to proceed with the above mentioned plan. Further management will depend upon hospital course. Disposition Plan: Back to previous home environment Consults called: Podiatry. All the records are reviewed and case discussed with ED provider.  Status is: Inpatient   At the time of the admission, it appears that the appropriate admission status for this patient is inpatient.  This is judged to be reasonable and necessary in order to provide the required intensity of service to ensure the patient's safety given the presenting symptoms, physical exam findings and initial radiographic and laboratory data in the context of comorbid conditions.  The patient requires inpatient status due to high intensity of service, high risk of further deterioration and high frequency of surveillance required.  I certify that at the time of admission, it is my clinical judgment that the patient will require inpatient hospital care extending more than 2 midnights.                            Dispo: The patient is from: Home              Anticipated d/c is to: Home              Patient currently is not medically stable to d/c.              Difficult to place patient: No   Christel Mormon M.D on 11/17/2021 at 3:31 PM  [facility]   From 7 PM-7 AM, contact  night-coverage [email or web address]  CC: Primary care physician; Remi Haggard, FNP

## 2021-11-17 NOTE — Consult Note (Signed)
ORTHOPAEDIC CONSULTATION  REQUESTING PHYSICIAN: Mansy, Arvella Merles, MD  Chief Complaint: Left foot infection  HPI: [place] Paolo. is a 71 y.o. male who complains of worsening left foot infection.  Brought from outside facility under care of hospice with worsening wound that the hospice facility did not feel they were comfortable caring for.  He has generalized weakness and debility secondary to Parkinson's disease.  History of A. fib, coronary artery disease and cardiomyopathy, hypertension as well.  Patient does answer some questions appropriately.  He could relate that he had an ulcer on his left foot and the ulcer was painful.  Past Medical History:  Diagnosis Date   Atrial fibrillation (HCC)    BPH (benign prostatic hyperplasia)    Cardiomyopathy ([place])    Coronary artery disease    Hypertension    Hypothyroidism    Parkinson's disease [facility])    Past Surgical History:  Procedure Laterality Date   COLONOSCOPY N/A 04/10/2015   Procedure: COLONOSCOPY;  Surgeon: Manya Silvas, MD;  Location: [facility];  Service: Endoscopy;  Laterality: N/A;   CORONARY ANGIOPLASTY     DIALYSIS/PERMA CATHETER INSERTION N/A 09/05/2017   Procedure: DIALYSIS/PERMA CATHETER INSERTION;  Surgeon: Algernon Huxley, MD;  Location: [facility];  Service: Cardiovascular;  Laterality: N/A;   Social History   Socioeconomic History   Marital status: Divorced    Spouse name: Not on file   Number of children: Not on file   Years of education: Not on file   Highest education level: Not on file  Occupational History   Not on file  Tobacco Use   Smoking status: Never   Smokeless tobacco: Never  Vaping Use   Vaping Use: Never used  Substance and Sexual Activity   Alcohol use: No   Drug use: No   Sexual activity: Not on file  Other Topics Concern   Not on file  Social History Narrative   Not on file   Social Determinants of Health   Financial Resource Strain: Not on file  Food Insecurity:  Not on file  Transportation Needs: Not on file  Physical Activity: Not on file  Stress: Not on file  Social Connections: Not on file   Family History  Problem Relation Age of Onset   Lung cancer Mother    Esophageal cancer Father    Allergies  Allergen Reactions   No Known Allergies    Prior to Admission medications   Medication Sig Start Date End Date Taking? Authorizing Provider  amiodarone (PACERONE) 100 MG tablet Take 100 mg by mouth in the morning and at bedtime. 11/09/21  Yes [provider]  amiodarone (PACERONE) 200 MG tablet 200 mg twice a day till 04/11/2020 then once a day 04/09/20  Yes Alekh, Kshitiz, MD  carbidopa-levodopa (SINEMET IR) 25-100 MG tablet Take 1 tablet by mouth 4 (four) times daily. Patient taking differently: Take 1 tablet by mouth in the morning and at bedtime. 1 tablet by mouth twice daily 04/09/20  Yes Alekh, Kshitiz, MD  donepezil (ARICEPT) 10 MG tablet Take 10 mg by mouth daily. 03/18/20  Yes [provider]  HYDROcodone-acetaminophen (NORCO/VICODIN) 5-325 MG tablet Take 1 tablet by mouth 3 (three) times daily as needed. 11/11/21  Yes [provider]  levothyroxine (SYNTHROID) 100 MCG tablet Take 100 mcg by mouth daily. 10/24/21  Yes [provider]  metoprolol tartrate (LOPRESSOR) 25 MG tablet Take 0.5 tablets (12.5 mg total) by mouth 2 (two) times daily. 04/09/20  Yes Aline August, MD  midodrine (PROAMATINE) 5 MG tablet Take 1 tablet (5 mg total) by mouth 3 (three) times daily with meals. 04/09/20  Yes Aline August, MD  Polyvinyl Alcohol-Povidone 5-6 MG/ML SOLN Place 1 drop into the left eye 4 (four) times daily as needed.   Yes [provider]  senna (SENOKOT) 8.6 MG TABS tablet Take 2 tablets by mouth at bedtime.   Yes [provider]  simvastatin (ZOCOR) 20 MG tablet Take 1 tablet (20 mg total) by mouth daily. Patient taking differently: Take 20 mg by mouth at bedtime. 09/23/17  Yes Fritzi Mandes, MD   vancomycin (VANCOCIN) 1 g injection SMARTSIG:IV 11/16/21  Yes [provider]  XARELTO 20 MG TABS tablet Take 20 mg by mouth daily. 11/05/21  Yes [provider]  acetaminophen (TYLENOL) 325 MG tablet Take 650 mg by mouth every 6 (six) hours. Patient not taking: Reported on 11/17/2021    [provider]  ibuprofen (ADVIL) 400 MG tablet Take 1 tablet (400 mg total) by mouth every 8 (eight) hours as needed for mild pain or moderate pain. Patient not taking: Reported on 11/17/2021 04/09/20   Aline August, MD  levofloxacin (LEVAQUIN) 500 MG tablet Take 500 mg by mouth daily. 11/07/21   [provider]  levofloxacin (LEVAQUIN) 750 MG tablet Take 750 mg by mouth daily. 11/07/21   [provider]  levothyroxine (SYNTHROID) 88 MCG tablet Take 88 mcg by mouth daily before breakfast.  Patient not taking: Reported on 11/17/2021    [provider]  traMADol (ULTRAM) 50 MG tablet Take 50 mg by mouth 3 (three) times daily. 11/04/21   [provider]  Vitamin D, Ergocalciferol, (DRISDOL) 1.25 MG (50000 UNIT) CAPS capsule Take 50,000 Units by mouth every 7 (seven) days.    [provider]  warfarin (COUMADIN) 3 MG tablet Take 1 tablet (3 mg total) by mouth one time only at 4 PM. Patient not taking: Reported on 11/17/2021 04/09/20   Aline August, MD   DG Foot Complete Left  Result Date: 11/17/2021 CLINICAL DATA:  Foot infection, wound EXAM: LEFT FOOT - COMPLETE 3+ VIEW COMPARISON:  None. FINDINGS: Soft tissue swelling and subcutaneous lucencies identified at the lateral aspect of the foot centered near the distal fifth metatarsal, consistent with soft tissue wound/infection. Destructive appearance of the fifth metatarsal head and base of the fifth proximal phalanx, with associated pathologic fracture at the metatarsal head. Bones are osteopenic. Mild degenerative changes at the first metatarsophalangeal joint noted. IMPRESSION: Evidence of soft  tissue infection/wound of the lateral aspect of the foot with underlying acute osteomyelitis involving the distal fifth metatarsal and fifth proximal phalanx. Electronically Signed   By: Ofilia Neas M.D.   On: 11/17/2021 14:49    Positive ROS: All other systems have been reviewed and were otherwise negative with the exception of those mentioned in the HPI and as above.  12 point ROS was performed.  Physical Exam: General: Alert and oriented.  No apparent distress.  Vascular:  Left foot:Dorsalis Pedis:  diminished Posterior Tibial:  diminished  Right foot: Dorsalis Pedis:  diminished Posterior Tibial:  diminished  Neuro:intact gross sensation.  Derm: At the left foot there is an ulcer to the left fifth metatarsal head region.  This probes directly deep to the bone and dorsal to the foot.  Obvious fluctuance to the area.  Purulence was noted with surrounding erythema.  Ortho/MS: Patient is in a flexion contraction to the lower extremities bilaterally.  Evaluation of left foot revealed the focal edema fluctuance around the left fifth MTPJ.  Assessment: Osteomyelitis with abscess left fifth toe joint  Plan: Patient has obvious infection with abscess and osteomyelitis on x-ray.  He will need I&D and removal of all of the infected tissue.  This likely will need to be left open and packed.  May lead second debridement or delayed primary closure down the road.  Patient did answer questions appropriately.  I specifically ask if he would like to proceed with surgery to remove the infection and he expressed agreement to this.  We will plan for surgery tomorrow.  Orders have been placed.    Elesa Hacker, DPM Cell [phone]   11/17/2021 6:07 PM

## 2021-11-18 ENCOUNTER — Inpatient Hospital Stay: Payer: Medicare Other | Admitting: Anesthesiology

## 2021-11-18 ENCOUNTER — Inpatient Hospital Stay: Payer: Medicare Other

## 2021-11-18 ENCOUNTER — Other Ambulatory Visit: Payer: Self-pay

## 2021-11-18 ENCOUNTER — Encounter
Admission: EM | Disposition: A | Discharge status: Skilled Nursing Facility | Payer: Self-pay | Source: Skilled Nursing Facility | Attending: Internal Medicine

## 2021-11-18 ENCOUNTER — Encounter: Payer: Self-pay | Admitting: Family Medicine

## 2021-11-18 DIAGNOSIS — G20 Parkinson's disease: Secondary | ICD-10-CM | POA: Diagnosis not present

## 2021-11-18 DIAGNOSIS — E876 Hypokalemia: Secondary | ICD-10-CM

## 2021-11-18 DIAGNOSIS — M86071 Acute hematogenous osteomyelitis, right ankle and foot: Secondary | ICD-10-CM | POA: Diagnosis not present

## 2021-11-18 HISTORY — PX: IRRIGATION AND DEBRIDEMENT FOOT: SHX6602

## 2021-11-18 HISTORY — PX: AMPUTATION: SHX166

## 2021-11-18 LAB — BASIC METABOLIC PANEL WITH GFR
Anion gap: 6 (ref 5–15)
BUN: 19 mg/dL (ref 8–23)
CO2: 28 mmol/L (ref 22–32)
Calcium: 8.2 mg/dL — ABNORMAL LOW (ref 8.9–10.3)
Chloride: 103 mmol/L (ref 98–111)
Creatinine, Ser: 0.61 mg/dL (ref 0.61–1.24)
GFR, Estimated: >60 mL/min (ref >60)
Glucose, Bld: 101 mg/dL — ABNORMAL HIGH (ref 70–99)
Potassium: 3.3 mmol/L — ABNORMAL LOW (ref 3.5–5.1)
Sodium: 137 mmol/L (ref 135–145)

## 2021-11-18 LAB — GLUCOSE, CAPILLARY
Glucose-Capillary: 86 mg/dL (ref 70–99)
Glucose-Capillary: 96 mg/dL (ref 70–99)

## 2021-11-18 LAB — HEPATIC FUNCTION PANEL
ALT: 106 U/L — ABNORMAL HIGH (ref 0–44)
AST: 135 U/L — ABNORMAL HIGH (ref 15–41)
Albumin: 2.3 g/dL — ABNORMAL LOW (ref 3.5–5.0)
Alkaline Phosphatase: 57 U/L (ref 38–126)
Bilirubin, Direct: 0.2 mg/dL (ref 0.0–0.2)
Indirect Bilirubin: 0.6 mg/dL (ref 0.3–0.9)
Total Bilirubin: 0.8 mg/dL (ref 0.3–1.2)
Total Protein: 5.3 g/dL — ABNORMAL LOW (ref 6.5–8.1)

## 2021-11-18 LAB — CBC
HCT: 37 % — ABNORMAL LOW (ref 39.0–52.0)
Hemoglobin: 12.1 g/dL — ABNORMAL LOW (ref 13.0–17.0)
MCH: 30.5 pg (ref 26.0–34.0)
MCHC: 32.7 g/dL (ref 30.0–36.0)
MCV: 93.2 fL (ref 80.0–100.0)
Platelets: 300 K/uL (ref 150–400)
RBC: 3.97 MIL/uL — ABNORMAL LOW (ref 4.22–5.81)
RDW: 14.2 % (ref 11.5–15.5)
WBC: 11.6 K/uL — ABNORMAL HIGH (ref 4.0–10.5)
nRBC: 0 % (ref 0.0–0.2)

## 2021-11-18 LAB — MAGNESIUM: Magnesium: 1.9 mg/dL (ref 1.7–2.4)

## 2021-11-18 LAB — HIV ANTIBODY (ROUTINE TESTING W REFLEX): HIV Screen 4th Generation wRfx: NONREACTIVE

## 2021-11-18 LAB — PHOSPHORUS: Phosphorus: 2.2 mg/dL — ABNORMAL LOW (ref 2.5–4.6)

## 2021-11-18 SURGERY — AMPUTATION, FOOT, RAY
Anesthesia: General | Site: Foot | Laterality: Left

## 2021-11-18 MED ORDER — POTASSIUM PHOSPHATES 15 MMOLE/5ML IV SOLN
15.0000 mmol | Freq: Once | INTRAVENOUS | Status: DC
  Filled 2021-11-18: qty 5

## 2021-11-18 MED ORDER — BUPIVACAINE HCL (PF) 0.5 % IJ SOLN
INTRAMUSCULAR | Status: AC
  Filled 2021-11-18: qty 30

## 2021-11-18 MED ORDER — PROPOFOL 500 MG/50ML IV EMUL
INTRAVENOUS | Status: DC | PRN
  Administered 2021-11-18: 15 ug/kg/min via INTRAVENOUS

## 2021-11-18 MED ORDER — SODIUM CHLORIDE 0.9 % IV SOLN
2.0000 g | Freq: Three times a day (TID) | INTRAVENOUS | Status: DC
  Administered 2021-11-19 – 2021-11-20 (×4): 2 g via INTRAVENOUS
  Filled 2021-11-18 (×9): qty 2

## 2021-11-18 MED ORDER — LACTATED RINGERS IV SOLN
INTRAVENOUS | Status: DC | PRN
Start: 2021-11-18 — End: 2021-11-18

## 2021-11-18 MED ORDER — POTASSIUM CHLORIDE 10 MEQ/100ML IV SOLN
10.0000 meq | INTRAVENOUS | Status: AC
  Administered 2021-11-18 – 2021-11-19 (×4): 10 meq via INTRAVENOUS
  Filled 2021-11-18 (×5): qty 100

## 2021-11-18 MED ORDER — KETAMINE HCL 10 MG/ML IJ SOLN
INTRAMUSCULAR | Status: DC | PRN
Start: 2021-11-18 — End: 2021-11-18
  Administered 2021-11-18: 20 mg via INTRAVENOUS

## 2021-11-18 MED ORDER — 0.9 % SODIUM CHLORIDE (POUR BTL) OPTIME
TOPICAL | Status: DC | PRN
  Administered 2021-11-18: 15:00:00 500 mL

## 2021-11-18 MED ORDER — LIDOCAINE-EPINEPHRINE 1 %-1:100000 IJ SOLN
INTRAMUSCULAR | Status: DC | PRN
  Administered 2021-11-18: 10 mL

## 2021-11-18 MED ORDER — FENTANYL CITRATE (PF) 100 MCG/2ML IJ SOLN
INTRAMUSCULAR | Status: AC
  Filled 2021-11-18: qty 2

## 2021-11-18 MED ORDER — BUPIVACAINE LIPOSOME 1.3 % IJ SUSP
INTRAMUSCULAR | Status: DC | PRN
Start: 2021-11-18 — End: 2021-11-18
  Administered 2021-11-18: 20 mL

## 2021-11-18 MED ORDER — BUPIVACAINE HCL 0.5 % IJ SOLN
INTRAMUSCULAR | Status: DC | PRN
  Administered 2021-11-18: 10 mL

## 2021-11-18 MED ORDER — LIDOCAINE-EPINEPHRINE 1 %-1:100000 IJ SOLN
INTRAMUSCULAR | Status: AC
  Filled 2021-11-18: qty 1

## 2021-11-18 MED ORDER — MIDAZOLAM HCL 2 MG/2ML IJ SOLN
INTRAMUSCULAR | Status: AC
  Filled 2021-11-18: qty 2

## 2021-11-18 MED ORDER — KETAMINE HCL 50 MG/5ML IJ SOSY
PREFILLED_SYRINGE | INTRAMUSCULAR | Status: AC
  Filled 2021-11-18: qty 5

## 2021-11-18 MED ORDER — FENTANYL CITRATE (PF) 100 MCG/2ML IJ SOLN
INTRAMUSCULAR | Status: DC | PRN
  Administered 2021-11-18 (×4): 25 ug via INTRAVENOUS

## 2021-11-18 MED ORDER — LIDOCAINE HCL (PF) 1 % IJ SOLN
INTRAMUSCULAR | Status: AC
  Filled 2021-11-18: qty 30

## 2021-11-18 MED ORDER — PHENYLEPHRINE HCL-NACL 20-0.9 MG/250ML-% IV SOLN
INTRAVENOUS | Status: DC | PRN
  Administered 2021-11-18: 25 ug/min via INTRAVENOUS

## 2021-11-18 MED ORDER — BUPIVACAINE LIPOSOME 1.3 % IJ SUSP
INTRAMUSCULAR | Status: AC
  Filled 2021-11-18: qty 20

## 2021-11-18 MED ORDER — LIDOCAINE HCL (PF) 2 % IJ SOLN
INTRAMUSCULAR | Status: AC
  Filled 2021-11-18: qty 5

## 2021-11-18 MED ORDER — PROPOFOL 10 MG/ML IV BOLUS
INTRAVENOUS | Status: AC
  Filled 2021-11-18: qty 40

## 2021-11-18 MED ORDER — HEMOSTATIC AGENTS (NO CHARGE) OPTIME
TOPICAL | Status: DC | PRN
Start: 2021-11-18 — End: 2021-11-18
  Administered 2021-11-18: 1 via TOPICAL

## 2021-11-18 MED ORDER — ONDANSETRON HCL 4 MG/2ML IJ SOLN
INTRAMUSCULAR | Status: AC
  Filled 2021-11-18: qty 2

## 2021-11-18 MED ORDER — FENTANYL CITRATE (PF) 100 MCG/2ML IJ SOLN: 25.0000 ug | INTRAMUSCULAR | Status: DC | PRN

## 2021-11-18 SURGICAL SUPPLY — 70 items
BAG COUNTER SPONGE SURGICOUNT (BAG) IMPLANT
BLADE OSC/SAGITTAL MD 5.5X18 (BLADE) IMPLANT
BLADE OSCILLATING/SAGITTAL (BLADE) ×1
BLADE SW THK.38XMED LNG THN (BLADE) IMPLANT
BNDG COHESIVE 4X5 TAN ST LF (GAUZE/BANDAGES/DRESSINGS) ×1 IMPLANT
BNDG COHESIVE 6X5 TAN ST LF (GAUZE/BANDAGES/DRESSINGS) ×2 IMPLANT
BNDG CONFORM 2 STRL LF (GAUZE/BANDAGES/DRESSINGS) ×1 IMPLANT
BNDG CONFORM 3 STRL LF (GAUZE/BANDAGES/DRESSINGS) ×1 IMPLANT
BNDG ELASTIC 4X5.8 VLCR NS LF (GAUZE/BANDAGES/DRESSINGS) ×2 IMPLANT
BNDG ELASTIC 4X5.8 VLCR STR LF (GAUZE/BANDAGES/DRESSINGS) ×1 IMPLANT
BNDG ESMARK 4X12 TAN STRL LF (GAUZE/BANDAGES/DRESSINGS) ×2 IMPLANT
BNDG GAUZE ELAST 4 BULKY (GAUZE/BANDAGES/DRESSINGS) ×2 IMPLANT
BNDG STRETCH 4X75 STRL LF (GAUZE/BANDAGES/DRESSINGS) ×1 IMPLANT
CUFF TOURN SGL QUICK 12 (TOURNIQUET CUFF) ×2 IMPLANT
CUFF TOURN SGL QUICK 18X4 (TOURNIQUET CUFF) ×1 IMPLANT
DRAIN PENROSE 12X.25 LTX STRL (MISCELLANEOUS) IMPLANT
DRAPE FLUOR MINI C-ARM 54X84 (DRAPES) IMPLANT
DRAPE XRAY CASSETTE 23X24 (DRAPES) IMPLANT
DRSG MEPILEX FLEX 3X3 (GAUZE/BANDAGES/DRESSINGS) IMPLANT
DURAPREP 26ML APPLICATOR (WOUND CARE) ×1 IMPLANT
ELECT REM PT RETURN 9FT ADLT (ELECTROSURGICAL) ×2
ELECTRODE REM PT RTRN 9FT ADLT (ELECTROSURGICAL) ×1 IMPLANT
GAUZE PACKING 1/4 X5 YD (GAUZE/BANDAGES/DRESSINGS) ×1 IMPLANT
GAUZE PACKING IODOFORM 1/2 (PACKING) ×1 IMPLANT
GAUZE PACKING IODOFORM 1X5 (PACKING) ×1 IMPLANT
GAUZE SPONGE 4X4 12PLY STRL (GAUZE/BANDAGES/DRESSINGS) ×3 IMPLANT
GAUZE XEROFORM 1X8 LF (GAUZE/BANDAGES/DRESSINGS) ×1 IMPLANT
GLOVE SURG ENC MOIS LTX SZ7.5 (GLOVE) ×2 IMPLANT
GLOVE SURG UNDER LTX SZ8 (GLOVE) ×2 IMPLANT
GOWN STRL REUS W/ TWL XL LVL3 (GOWN DISPOSABLE) ×2 IMPLANT
GOWN STRL REUS W/TWL MED LVL3 (GOWN DISPOSABLE) ×4 IMPLANT
GOWN STRL REUS W/TWL XL LVL3 (GOWN DISPOSABLE) ×2
HANDLE YANKAUER SUCT BULB TIP (MISCELLANEOUS) IMPLANT
HANDPIECE VERSAJET DEBRIDEMENT (MISCELLANEOUS) IMPLANT
HEMOSTAT SURGICEL 2X3 (HEMOSTASIS) ×1 IMPLANT
IV NS 1000ML (IV SOLUTION)
IV NS 1000ML BAXH (IV SOLUTION) ×1 IMPLANT
IV NS IRRIG 3000ML ARTHROMATIC (IV SOLUTION) ×1 IMPLANT
KIT TURNOVER KIT A (KITS) ×2 IMPLANT
LABEL OR SOLS (LABEL) ×1 IMPLANT
MANIFOLD NEPTUNE II (INSTRUMENTS) ×2 IMPLANT
NDL FILTER BLUNT 18X1 1/2 (NEEDLE) ×1 IMPLANT
NDL HYPO 25X1 1.5 SAFETY (NEEDLE) ×1 IMPLANT
NDL SAFETY ECLIPSE 18X1.5 (NEEDLE) ×1 IMPLANT
NEEDLE FILTER BLUNT 18X 1/2SAF (NEEDLE)
NEEDLE FILTER BLUNT 18X1 1/2 (NEEDLE) IMPLANT
NEEDLE HYPO 18GX1.5 SHARP (NEEDLE) ×1
NEEDLE HYPO 25X1 1.5 SAFETY (NEEDLE) ×2 IMPLANT
NS IRRIG 500ML POUR BTL (IV SOLUTION) ×2 IMPLANT
PACK EXTREMITY ARMC (MISCELLANEOUS) ×2 IMPLANT
PAD ABD DERMACEA PRESS 5X9 (GAUZE/BANDAGES/DRESSINGS) ×4 IMPLANT
PULSAVAC PLUS IRRIG FAN TIP (DISPOSABLE)
RASP SM TEAR CROSS CUT (RASP) IMPLANT
SHIELD FULL FACE ANTIFOG 7M (MISCELLANEOUS) ×2 IMPLANT
SOL PREP PVP 2OZ (MISCELLANEOUS) ×2
SOLUTION PREP PVP 2OZ (MISCELLANEOUS) ×1 IMPLANT
SPONGE T-LAP 18X18 ~~LOC~~+RFID (SPONGE) ×2 IMPLANT
STOCKINETTE IMPERVIOUS 9X36 MD (GAUZE/BANDAGES/DRESSINGS) ×2 IMPLANT
STOCKINETTE M/LG 89821 (MISCELLANEOUS) ×2 IMPLANT
SUT ETHILON 2 0 FS 18 (SUTURE) ×2 IMPLANT
SUT ETHILON 4-0 (SUTURE) ×1
SUT ETHILON 4-0 FS2 18XMFL BLK (SUTURE) ×1
SUT VIC AB 3-0 SH 27 (SUTURE) ×1
SUT VIC AB 3-0 SH 27X BRD (SUTURE) ×1 IMPLANT
SUT VIC AB 4-0 FS2 27 (SUTURE) ×2 IMPLANT
SUTURE ETHLN 4-0 FS2 18XMF BLK (SUTURE) ×1 IMPLANT
SWAB CULTURE AMIES ANAERIB BLU (MISCELLANEOUS) ×1 IMPLANT
SYR 10ML LL (SYRINGE) ×4 IMPLANT
TIP FAN IRRIG PULSAVAC PLUS (DISPOSABLE) ×1 IMPLANT
WATER STERILE IRR 500ML POUR (IV SOLUTION) ×2 IMPLANT

## 2021-11-18 NOTE — Progress Notes (Signed)
PROGRESS NOTE    Douglas Silva.  PZW:[id number] DOB: Jan 26, 1951 DOA: 11/17/2021 PCP: Remi Haggard, FNP   Brief Narrative:  The patient is a 71 year old thin chronically ill-appearing Caucasian male with a past medical history significant for but not limited to atrial fibrillation, BPH, CAD, hypothyroidism, history of cardiomyopathy, history of Parkinson's disease as well as other comorbidities who presented to [facility] with left foot worsening despite chronic antibiotics at the facility with inability to take care of the wound.  He had no reported chills or fevers and no nausea or vomiting.  When he came to the ED blood pressure 97/77 temperature 97.2.  Labs were reviewed and he had a hypokalemia and elevated LFTs.  CBC showed an elevated WBC of 10.7 with anemia.  Imaging of the left foot showed soft tissue infection and wound of the lateral aspect of the foot with underlying acute osteomyelitis involving the distal fifth metatarsal at the fifth proximal phalanx.  He was initially given IV cefepime, IV vancomycin and Flagyl and admitted to a medical bed for further management and podiatry was consulted.  Podiatry felt the patient would benefit from amputation and took the patient to the OR later this afternoon.  Assessment & Plan:   Principal Problem:   Acute hematogenous osteomyelitis of right foot (HCC)  Acute osteomyelitis of the left foot with abscess -The patient was admitted to a medical bed. -We will continue antibiotic therapy with IV vancomycin but change IV Zosyn to IV cefepime. -Podiatry was consulted and Dr. Asa Lente evaluated and he felt that the patient will need an I&D and removal of all infected tissue and felt that this was likely need to be left open and packed; he also felt that this may lead to a second primary delayed primary closure down the road -He has been n.p.o. after midnight for surgical intervention later today -Currently getting NS + Kcl  20 mEQ at 100 mL/hr -Patient's WBC went from 10.7 is now 11.6 -Continue with IV antibiotics as above and continue monitor and follow-up on podiatry recommendations  Paroxysmal atrial fibrillation currently rate controlled ventricular response. -C/w Amiodarone 200 mg po Daily -Hold AC given Surgical Intervention  Hypokalemia -Patient's potassium was 3.3 -Given that he is n.p.o. for surgical intervention we will replete with IV KCl 40 mEq as well as IV K-Phos 15 mmol -Continue to monitor and replete as necessary -Repeat CMP in a.m.  Hypophosphatemia -Patient's Phos level was 2.2 -Replete with IV K-Phos 15 mmol -Continue to monitor and replete as necessary -Repeat phosphorus level in a.m.  Abnormal LFTs -Slightly worsening -Patient's AST went from 158 is now 135 -ALT went from 60 now 106 -Continue to monitor and trend hepatic function carefully and if necessary will obtain a right upper quadrant ultrasound as well as an acute hepatitis panel in the morning -Repeat CMP in a.m.  Hypothyroidism. -Continue Levothyroxine.  Dementia and Parkinson's disease. -Continue home Sinemet IR 25-100 mg 1 tab p.o. 4 times daily as well as donepezil 10 mg p.o. daily -Has significant atrophy and muscle wasting in the setting of his Parkinson's disease -We will consult palliative care for goals of care discussion  Coronary artery disease. -Continue metoprolol tartrate 12.5 mg p.o. twice daily as well as simvastatin 20 mg p.o. nightly  Dyslipidemia. -Continue simvastatin 20 mg p.o. nightly  Normocytic Anemia -Patient's hemoglobin/hematocrit went from 12.8/39.3 and is now 12.1/37.0 -Check anemia panel in the a.m. -Continue to monitor for signs and symptoms bleeding; no overt  bleeding noted -Repeat CBC in the a.m.  DVT prophylaxis: SCDs Code Status: FULL CODE Family Communication: No family present at bedside  Disposition Plan: Pending further clinical improvement and clearance by  podiatry  Status is: Inpatient  Remains inpatient appropriate because: He will be undergoing surgical intervention today by podiatry  Consultants:  Podiatry   Procedures: None  Antimicrobials:  Anti-infectives (From admission, onward)    Start     Dose/Rate Route Frequency Ordered Stop   11/18/21 1400  [MAR Hold]  ceFEPIme (MAXIPIME) 2 g in sodium chloride 0.9 % 100 mL IVPB        (MAR Hold since Wed 11/18/2021 at 1408.Hold Reason: Transfer to a Procedural area)   2 g 200 mL/hr over 30 Minutes Intravenous Every 8 hours 11/18/21 1001     11/18/21 0500  vancomycin (VANCOREADY) IVPB 1500 mg/300 mL  Status:  Discontinued        1,500 mg 150 mL/hr over 120 Minutes Intravenous Every 12 hours 11/17/21 1611 11/17/21 1615   11/18/21 0500  [MAR Hold]  vancomycin (VANCOCIN) IVPB 1000 mg/200 mL premix        (MAR Hold since Wed 11/18/2021 at 1408.Hold Reason: Transfer to a Procedural area)   1,000 mg 200 mL/hr over 60 Minutes Intravenous Every 12 hours 11/17/21 1615     11/17/21 1800  piperacillin-tazobactam (ZOSYN) IVPB 3.375 g  Status:  Discontinued        3.375 g 100 mL/hr over 30 Minutes Intravenous Every 6 hours 11/17/21 1559 11/17/21 1604   11/17/21 1700  vancomycin (VANCOREADY) IVPB 500 mg/100 mL        500 mg 100 mL/hr over 60 Minutes Intravenous  Once 11/17/21 1611 11/17/21 2249   11/17/21 1615  piperacillin-tazobactam (ZOSYN) IVPB 3.375 g  Status:  Discontinued        3.375 g 12.5 mL/hr over 240 Minutes Intravenous Every 8 hours 11/17/21 1606 11/18/21 1001   11/17/21 1600  vancomycin (VANCOCIN) IVPB 1000 mg/200 mL premix  Status:  Discontinued        1,000 mg 200 mL/hr over 60 Minutes Intravenous  Once 11/17/21 1557 11/17/21 1559   11/17/21 1600  ceFEPIme (MAXIPIME) 2 g in sodium chloride 0.9 % 100 mL IVPB  Status:  Discontinued        2 g 200 mL/hr over 30 Minutes Intravenous  Once 11/17/21 1557 11/17/21 1559   11/17/21 1415  ceFEPIme (MAXIPIME) 2 g in sodium chloride 0.9 % 100 mL  IVPB        2 g 200 mL/hr over 30 Minutes Intravenous  Once 11/17/21 1409 11/17/21 1512   11/17/21 1415  metroNIDAZOLE (FLAGYL) IVPB 500 mg        500 mg 100 mL/hr over 60 Minutes Intravenous  Once 11/17/21 1409 11/17/21 1623   11/17/21 1415  vancomycin (VANCOCIN) IVPB 1000 mg/200 mL premix        1,000 mg 200 mL/hr over 60 Minutes Intravenous  Once 11/17/21 1409 11/17/21 1625        Subjective: Seen and examined at bedside and he has significant atrophy and muscle wasting as well as contractures in the setting of his advanced Parkinson's disease.  He is able to communicate and does tell me that he had some pain.  No nausea or vomiting.  Denies any lightheadedness or dizziness.  No other concerns or close this time.  Objective: Vitals:   11/18/21 1255 11/18/21 1400 11/18/21 1536 11/18/21 1545  BP: 119/76 125/62 102/65 119/72  Pulse:  77 (!) 58 65 (!) 59  Resp: 17 20 13 11   Temp: 98.1 F (36.7 C) 98 F (36.7 C) (!) 97.3 F (36.3 C)   TempSrc:      SpO2: 100% 99% 100% 97%  Weight:      Height:        Intake/Output Summary (Last 24 hours) at 11/18/2021 1551 Last data filed at 11/18/2021 1536 Gross per 24 hour  Intake 668.17 ml  Output 102 ml  Net 566.17 ml   Filed Weights   11/17/21 1354  Weight: 68.6 kg   Examination: Physical Exam:  Constitutional: Thin chronically ill-appearing Caucasian male who is significantly contracted in the setting of his advanced Parkinson's disease and has some muscle atrophy Eyes: Lids and conjunctivae normal, sclerae anicteric  ENMT: External Ears, Nose appear normal. Grossly normal hearing.  Neck: Appears normal, supple, no cervical masses, normal ROM, no appreciable thyromegaly; no appreciable JVD Respiratory: Diminished to auscultation bilaterally, no wheezing, rales, rhonchi or crackles. Normal respiratory effort and patient is not tachypenic. No accessory muscle use.  Unlabored breathing Cardiovascular: RRR, no murmurs / rubs / gallops.  S1 and S2 auscultated.  Abdomen: Soft, non-tender, non-distended. Bowel sounds positive.  GU: Deferred. Musculoskeletal: Has contractures and muscle atrophy Skin: No rashes, lesions, ulcers on limited skin evaluation and is foot is bandaged.  Neurologic: Significantly contracted and has muscle atrophy.  Has his head bent to the left Psychiatric: Normal judgment and insight. Alert and oriented x 3. Normal mood and appropriate affect.   Data Reviewed: I have personally reviewed following labs and imaging studies  CBC: Recent Labs  Lab 11/17/21 1406 11/18/21 0736  WBC 10.7* 11.6*  NEUTROABS 7.9*  --   HGB 12.8* 12.1*  HCT 39.3 37.0*  MCV 95.6 93.2  PLT 300 016   Basic Metabolic Panel: Recent Labs  Lab 11/17/21 1406 11/18/21 0500 11/18/21 0736  NA 137  --  137  K 2.9*  --  3.3*  CL 103  --  103  CO2 29  --  28  GLUCOSE 125*  --  101*  BUN 18  --  19  CREATININE 0.51*  --  0.61  CALCIUM 8.3*  --  8.2*  MG  --  1.9  --   PHOS  --  2.2*  --    GFR: Estimated Creatinine Clearance: 84.6 mL/min (by C-G formula based on SCr of 0.61 mg/dL). Liver Function Tests: Recent Labs  Lab 11/17/21 1406 11/18/21 0500  AST 158* 135*  ALT 60* 106*  ALKPHOS 65 57  BILITOT 0.7 0.8  PROT 5.7* 5.3*  ALBUMIN 2.5* 2.3*   No results for input(s): LIPASE, AMYLASE in the last 168 hours. No results for input(s): AMMONIA in the last 168 hours. Coagulation Profile: Recent Labs  Lab 11/17/21 1406  INR 1.5*   Cardiac Enzymes: No results for input(s): CKTOTAL, CKMB, CKMBINDEX, TROPONINI in the last 168 hours. BNP (last 3 results) No results for input(s): PROBNP in the last 8760 hours. HbA1C: No results for input(s): HGBA1C in the last 72 hours. CBG: No results for input(s): GLUCAP in the last 168 hours. Lipid Profile: No results for input(s): CHOL, HDL, LDLCALC, TRIG, CHOLHDL, LDLDIRECT in the last 72 hours. Thyroid Function Tests: No results for input(s): TSH, T4TOTAL, FREET4,  T3FREE, THYROIDAB in the last 72 hours. Anemia Panel: No results for input(s): VITAMINB12, FOLATE, FERRITIN, TIBC, IRON, RETICCTPCT in the last 72 hours. Sepsis Labs: Recent Labs  Lab 11/17/21 1406  LATICACIDVEN 1.3  Recent Results (from the past 240 hour(s))  Resp Panel by RT-PCR (Flu A&B, Covid) Nasopharyngeal Swab     Status: None   Collection Time: 11/17/21  2:42 PM   Specimen: Nasopharyngeal Swab; Nasopharyngeal(NP) swabs in vial transport medium  Result Value Ref Range Status   SARS Coronavirus 2 by RT PCR NEGATIVE NEGATIVE Final    Comment: (NOTE) SARS-CoV-2 target nucleic acids are NOT DETECTED.  The SARS-CoV-2 RNA is generally detectable in upper respiratory specimens during the acute phase of infection. The lowest concentration of SARS-CoV-2 viral copies this assay can detect is 138 copies/mL. A negative result does not preclude SARS-Cov-2 infection and should not be used as the sole basis for treatment or other patient management decisions. A negative result may occur with  improper specimen collection/handling, submission of specimen other than nasopharyngeal swab, presence of viral mutation(s) within the areas targeted by this assay, and inadequate number of viral copies(<138 copies/mL). A negative result must be combined with clinical observations, patient history, and epidemiological information. The expected result is Negative.  Fact Sheet for Patients:  EntrepreneurPulse.com.au  Fact Sheet for Healthcare Providers:  IncredibleEmployment.be  This test is no t yet approved or cleared by the [place] FDA and  has been authorized for detection and/or diagnosis of SARS-CoV-2 by FDA under an Emergency Use Authorization (EUA). This EUA will remain  in effect (meaning this test can be used) for the duration of the COVID-19 declaration under Section 564(b)(1) of the Act, 21 U.S.C.section 360bbb-3(b)(1), unless the  authorization is terminated  or revoked sooner.       Influenza A by PCR NEGATIVE NEGATIVE Final   Influenza B by PCR NEGATIVE NEGATIVE Final    Comment: (NOTE) The Xpert Xpress SARS-CoV-2/FLU/RSV plus assay is intended as an aid in the diagnosis of influenza from Nasopharyngeal swab specimens and should not be used as a sole basis for treatment. Nasal washings and aspirates are unacceptable for Xpert Xpress SARS-CoV-2/FLU/RSV testing.  Fact Sheet for Patients: EntrepreneurPulse.com.au  Fact Sheet for Healthcare Providers: IncredibleEmployment.be  This test is not yet approved or cleared by the [place] FDA and has been authorized for detection and/or diagnosis of SARS-CoV-2 by FDA under an Emergency Use Authorization (EUA). This EUA will remain in effect (meaning this test can be used) for the duration of the COVID-19 declaration under Section 564(b)(1) of the Act, 21 U.S.C. section 360bbb-3(b)(1), unless the authorization is terminated or revoked.  Performed at [facility], [address] [place], [place] [address]   Aerobic/Anaerobic Culture w Gram Stain (surgical/deep wound)     Status: None (Preliminary result)   Collection Time: 11/17/21  6:33 PM   Specimen: Foot; Abscess  Result Value Ref Range Status   Specimen Description   Final    FOOT Performed at [facility], [address] [place], [place] [address]    Special Requests   Final    LEFT Performed at [facility] [address] [place], [place] [address]    Gram Stain   Final    MODERATE WBC PRESENT,BOTH PMN AND MONONUCLEAR MODERATE GRAM POSITIVE COCCI RARE GRAM NEGATIVE RODS Performed at [facility], [place] [address] [place], [place] [address]    Culture PENDING  Incomplete   Report Status PENDING  Incomplete     RN Pressure Injury Documentation: Pressure Injury 11/18/21 Buttocks (Active)  11/18/21   Location: Buttocks   Location Orientation:   Staging:   Wound Description (Comments):   Present  on Admission: Yes     Estimated body mass index is 20.52 kg/m as calculated from the following:   Height as of this encounter: 6' (1.829 m).   Weight as of this encounter: 68.6 kg.  Malnutrition Type:   Malnutrition Characteristics:   Nutrition Interventions:   Radiology Studies: DG Foot Complete Left  Result Date: 11/17/2021 CLINICAL DATA:  Foot infection, wound EXAM: LEFT FOOT - COMPLETE 3+ VIEW COMPARISON:  None. FINDINGS: Soft tissue swelling and subcutaneous lucencies identified at the lateral aspect of the foot centered near the distal fifth metatarsal, consistent with soft tissue wound/infection. Destructive appearance of the fifth metatarsal head and base of the fifth proximal phalanx, with associated pathologic fracture at the metatarsal head. Bones are osteopenic. Mild degenerative changes at the first metatarsophalangeal joint noted. IMPRESSION: Evidence of soft tissue infection/wound of the lateral aspect of the foot with underlying acute osteomyelitis involving the distal fifth metatarsal and fifth proximal phalanx. Electronically Signed   By: Ofilia Neas M.D.   On: 11/17/2021 14:49   DG MINI C-ARM IMAGE ONLY  Result Date: 11/18/2021 There is no interpretation for this exam.  This order is for images obtained during a surgical procedure.  Please See "Surgeries" Tab for more information regarding the procedure.    Scheduled Meds:  [MAR Hold] amiodarone  200 mg Oral Daily   [MAR Hold] carbidopa-levodopa  1 tablet Oral QID   chlorhexidine  60 mL Topical Once   [MAR Hold] donepezil  10 mg Oral Daily   [MAR Hold] levothyroxine  100 mcg Oral Q0600   [MAR Hold] metoprolol tartrate  12.5 mg Oral BID   [MAR Hold] midodrine  5 mg Oral TID WC   povidone-iodine  2 application Topical Once   [MAR Hold] simvastatin  20 mg Oral QHS   [MAR Hold] Vitamin D (Ergocalciferol)  50,000 Units Oral Q7 days    Continuous Infusions:  0.9 % NaCl with KCl 20 mEq / L 100 mL/hr at 11/17/21 2148   [MAR Hold] ceFEPime (MAXIPIME) IV     [MAR Hold] vancomycin 1,000 mg (11/18/21 0603)    LOS: 1 day   Kerney Elbe, DO [facility] PAGER is on [place]  If 7PM-7AM, please contact night-coverage [email or web address]

## 2021-11-18 NOTE — Progress Notes (Signed)
Spoke to [name] who is the healthcare power of attorney.  Updated her on the surgery today.  She requested updates if there is any change in status or plans for future procedures.  Her phone number is [phone]

## 2021-11-18 NOTE — Anesthesia Postprocedure Evaluation (Signed)
Anesthesia Post Note  Patient: Douglas Silva.  Procedure(s) Performed: LEFT FIFTH RAY AMPUTATION (Left: Foot) IRRIGATION AND DEBRIDEMENT FOOT (Left: Foot)  Patient location during evaluation: PACU Anesthesia Type: General Level of consciousness: awake (pt responds purposefully to questions with head gestures and weak speech which was his mental status prior to surgery. ) Pain management: pain level controlled Vital Signs Assessment: post-procedure vital signs reviewed and stable Respiratory status: spontaneous breathing, nonlabored ventilation and respiratory function stable Cardiovascular status: blood pressure returned to baseline and stable Postop Assessment: no apparent nausea or vomiting Anesthetic complications: no   No notable events documented.   Last Vitals:  Vitals:   11/18/21 1612 11/18/21 1655  BP:  (!) 132/109  Pulse: 66 64  Resp: 14 17  Temp: (!) 36.1 C 36.7 C  SpO2: 95% 98%    Last Pain:  Vitals:   11/18/21 1600  TempSrc:   PainSc: 0-No pain                 [place] Ouch

## 2021-11-18 NOTE — Transfer of Care (Signed)
Immediate Anesthesia Transfer of Care Note  Patient: Douglas Silva.  Procedure(s) Performed: LEFT FIFTH RAY AMPUTATION (Left: Foot) IRRIGATION AND DEBRIDEMENT FOOT (Left: Foot)  Patient Location: PACU  Anesthesia Type:General  Level of Consciousness: drowsy  Airway & Oxygen Therapy: Patient Spontanous Breathing and Patient connected to face mask oxygen  Post-op Assessment: Report given to RN and Post -op Vital signs reviewed and stable  Post vital signs: Reviewed and stable  Last Vitals:  Vitals Value Taken Time  BP 102/65 11/18/21 1535  Temp    Pulse 58 11/18/21 1541  Resp 12 11/18/21 1541  SpO2 99 % 11/18/21 1541  Vitals shown include unvalidated device data.  Last Pain:  Vitals:   11/18/21 0552  TempSrc:   PainSc: Asleep         Complications: No notable events documented.

## 2021-11-18 NOTE — Anesthesia Preprocedure Evaluation (Signed)
Anesthesia Evaluation  Patient identified by MRN, date of birth, ID band Patient awake    Reviewed: Allergy & Precautions, NPO status , Patient's Chart, lab work & pertinent test results, reviewed documented beta blocker date and time   History of Anesthesia Complications Negative for: history of anesthetic complications  Airway Mallampati: II  TM Distance: >3 FB     Dental  (+) Chipped, Dental Advidsory Given, Poor Dentition   Pulmonary neg pulmonary ROS,           Cardiovascular hypertension, Pt. on medications (-) angina+ CAD  (-) Past MI and (-) Cardiac Stents + dysrhythmias Atrial Fibrillation (-) Valvular Problems/Murmurs     Neuro/Psych neg Seizures  Neuromuscular disease (Parkinson's disease) negative psych ROS   GI/Hepatic negative GI ROS, Neg liver ROS,   Endo/Other  neg diabetesHypothyroidism   Renal/GU negative Renal ROS     Musculoskeletal   Abdominal   Peds  Hematology   Anesthesia Other Findings Past Medical History: No date: Atrial fibrillation (HCC) No date: BPH (benign prostatic hyperplasia) No date: Cardiomyopathy ([place]) No date: Coronary artery disease No date: Hypertension No date: Hypothyroidism No date: Parkinson's disease ([place])   Reproductive/Obstetrics                             Anesthesia Physical  Anesthesia Plan  ASA: 3  Anesthesia Plan: General   Post-op Pain Management:    Induction: Intravenous  PONV Risk Score and Plan: 2 and Treatment may vary due to age or medical condition, Propofol infusion and TIVA  Airway Management Planned: Natural Airway and Simple Face Mask  Additional Equipment:   Intra-op Plan:   Post-operative Plan:   Informed Consent: I have reviewed the patients History and Physical, chart, labs and discussed the procedure including the risks, benefits and alternatives for the proposed anesthesia with the patient or  authorized representative who has indicated his/her understanding and acceptance.       Plan Discussed with: CRNA  Anesthesia Plan Comments:         Anesthesia Quick Evaluation

## 2021-11-18 NOTE — Progress Notes (Signed)
Patient off unit for surgery. 

## 2021-11-18 NOTE — Op Note (Signed)
Operative note   Surgeon:Alveria Mcglaughlin Lawyer: None    Preop diagnosis: Abscess with osteomyelitis left fifth ray    Postop diagnosis: Same    Procedure: Left fifth ray amputation    EBL: 10 mL    Anesthesia:local and IV sedation    Hemostasis: Ankle tourniquet inflated to 200 mmHg for approximately 10 minutes    Specimen: Fifth ray amputation and deep wound culture    Complications: None    Operative indications:Douglas Silva. is an 71 y.o. that presents today for surgical intervention.  The risks/benefits/alternatives/complications have been discussed and consent has been given.    Procedure:  Patient was brought into the OR and placed on the operating table in thesupine position. After anesthesia was obtained theleft lower extremity was prepped and draped in usual sterile fashion.  Attention was directed to the lateral aspect of the left foot where a longitudinal incision was made over the dorsal lateral left foot.  Upon incision Douglas Silva purulence was noted.  Dissection was carried down to the midshaft of the metatarsal.  Elliptical incision was made around the toe.  At this time the osteotomy was created in the midshaft region of the metatarsal.  The fifth ray was then removed from the surgical field in toto.  A large amount of Douglas Silva purulent drainage was noted.  Deep wound cultures were performed at this time.  The wound was then flushed with copious amounts of irrigation.  A large amount of necrotic tissue was removed from the plantar fifth met head ulceration site at this time.  The tourniquet was released.  Bleeders were Bovie cauterized.  The wound was packed with Surgicel.  Next closure of the incision was performed with a 3-0 nylon.  The plantar aspect of the wound was packed with half-inch iodoform packing.  A large bulky sterile dressing was applied.  Exparel long-acting anesthetic was applied around the entire surgical site.    Patient tolerated the procedure and  anesthesia well.  Was transported from the OR to the PACU with all vital signs stable and vascular status intact. To be discharged per routine protocol.  Will follow up in approximately 1 week in the outpatient clinic.

## 2021-11-18 NOTE — TOC Progression Note (Signed)
Transition of Care (TOC) - Progression Note    Patient Details  Name: Douglas Silva. MRN: [id number] Date of Birth: April 18, 1951  Transition of [facility] CM/SW [place], RN Phone Number: 11/18/2021, 3:07 PM  Clinical Narrative:   The patient is a long term care resident at [facility] today, [name] [phone]         Expected Discharge Plan and Services                                                 Social Determinants of Health (SDOH) Interventions    Readmission Risk Interventions No flowsheet data found.

## 2021-11-19 ENCOUNTER — Encounter: Payer: Self-pay | Admitting: Podiatry

## 2021-11-19 DIAGNOSIS — G20 Parkinson's disease: Secondary | ICD-10-CM | POA: Diagnosis not present

## 2021-11-19 DIAGNOSIS — E876 Hypokalemia: Secondary | ICD-10-CM | POA: Diagnosis not present

## 2021-11-19 DIAGNOSIS — M86071 Acute hematogenous osteomyelitis, right ankle and foot: Secondary | ICD-10-CM | POA: Diagnosis not present

## 2021-11-19 DIAGNOSIS — E440 Moderate protein-calorie malnutrition: Secondary | ICD-10-CM | POA: Insufficient documentation

## 2021-11-19 LAB — COMPREHENSIVE METABOLIC PANEL
ALT: 129 U/L — ABNORMAL HIGH (ref 0–44)
AST: 170 U/L — ABNORMAL HIGH (ref 15–41)
Albumin: 2.5 g/dL — ABNORMAL LOW (ref 3.5–5.0)
Alkaline Phosphatase: 61 U/L (ref 38–126)
Anion gap: 6 (ref 5–15)
BUN: 11 mg/dL (ref 8–23)
CO2: 29 mmol/L (ref 22–32)
Calcium: 8.6 mg/dL — ABNORMAL LOW (ref 8.9–10.3)
Chloride: 103 mmol/L (ref 98–111)
Creatinine, Ser: 0.52 mg/dL — ABNORMAL LOW (ref 0.61–1.24)
GFR, Estimated: >60 mL/min (ref >60)
Glucose, Bld: 103 mg/dL — ABNORMAL HIGH (ref 70–99)
Potassium: 3.4 mmol/L — ABNORMAL LOW (ref 3.5–5.1)
Sodium: 138 mmol/L (ref 135–145)
Total Bilirubin: 1 mg/dL (ref 0.3–1.2)
Total Protein: 5.8 g/dL — ABNORMAL LOW (ref 6.5–8.1)

## 2021-11-19 LAB — CBC WITH DIFFERENTIAL/PLATELET
Abs Immature Granulocytes: 0.06 10*3/uL (ref 0.00–0.07)
Basophils Absolute: 0.1 10*3/uL (ref 0.0–0.1)
Basophils Relative: 0 %
Eosinophils Absolute: 0.1 10*3/uL (ref 0.0–0.5)
Eosinophils Relative: 1 %
HCT: 37.9 % — ABNORMAL LOW (ref 39.0–52.0)
Hemoglobin: 12.6 g/dL — ABNORMAL LOW (ref 13.0–17.0)
Immature Granulocytes: 1 %
Lymphocytes Relative: 17 %
Lymphs Abs: 2.1 10*3/uL (ref 0.7–4.0)
MCH: 30.6 pg (ref 26.0–34.0)
MCHC: 33.2 g/dL (ref 30.0–36.0)
MCV: 92 fL (ref 80.0–100.0)
Monocytes Absolute: 1.2 10*3/uL — ABNORMAL HIGH (ref 0.1–1.0)
Monocytes Relative: 9 %
Neutro Abs: 9.4 10*3/uL — ABNORMAL HIGH (ref 1.7–7.7)
Neutrophils Relative %: 72 %
Platelets: 316 10*3/uL (ref 150–400)
RBC: 4.12 MIL/uL — ABNORMAL LOW (ref 4.22–5.81)
RDW: 14.2 % (ref 11.5–15.5)
WBC: 12.9 10*3/uL — ABNORMAL HIGH (ref 4.0–10.5)
nRBC: 0 % (ref 0.0–0.2)

## 2021-11-19 LAB — PHOSPHORUS: Phosphorus: 1.9 mg/dL — ABNORMAL LOW (ref 2.5–4.6)

## 2021-11-19 LAB — MAGNESIUM: Magnesium: 1.7 mg/dL (ref 1.7–2.4)

## 2021-11-19 MED ORDER — SODIUM CHLORIDE 0.9% FLUSH: 10.0000 mL | INTRAVENOUS | Status: DC | PRN

## 2021-11-19 MED ORDER — POTASSIUM PHOSPHATES 15 MMOLE/5ML IV SOLN
15.0000 mmol | Freq: Once | INTRAVENOUS | Status: AC
  Administered 2021-11-19: 15 mmol via INTRAVENOUS
  Filled 2021-11-19: qty 5

## 2021-11-19 MED ORDER — MAGNESIUM SULFATE 2 GM/50ML IV SOLN
2.0000 g | Freq: Once | INTRAVENOUS | Status: AC
  Administered 2021-11-19: 2 g via INTRAVENOUS
  Filled 2021-11-19: qty 50

## 2021-11-19 MED ORDER — ADULT MULTIVITAMIN W/MINERALS CH
1.0000 | ORAL_TABLET | Freq: Every day | ORAL | Status: DC
  Administered 2021-11-19 – 2021-11-24 (×6): 1 via ORAL
  Filled 2021-11-19 (×6): qty 1

## 2021-11-19 MED ORDER — ZINC SULFATE 220 (50 ZN) MG PO CAPS
220.0000 mg | ORAL_CAPSULE | Freq: Every day | ORAL | Status: DC
  Administered 2021-11-19 – 2021-11-24 (×5): 220 mg via ORAL
  Filled 2021-11-19 (×6): qty 1

## 2021-11-19 MED ORDER — CHLORHEXIDINE GLUCONATE CLOTH 2 % EX PADS
6.0000 | MEDICATED_PAD | Freq: Every day | CUTANEOUS | Status: DC
  Administered 2021-11-19 – 2021-11-24 (×5): 6 via TOPICAL

## 2021-11-19 MED ORDER — ASCORBIC ACID 500 MG PO TABS
500.0000 mg | ORAL_TABLET | Freq: Two times a day (BID) | ORAL | Status: DC
  Administered 2021-11-19 – 2021-11-24 (×11): 500 mg via ORAL
  Filled 2021-11-19 (×11): qty 1

## 2021-11-19 MED ORDER — POTASSIUM CHLORIDE 10 MEQ/100ML IV SOLN
10.0000 meq | INTRAVENOUS | Status: AC
  Administered 2021-11-19 (×3): 10 meq via INTRAVENOUS
  Filled 2021-11-19 (×3): qty 100

## 2021-11-19 MED ORDER — SODIUM CHLORIDE 0.9% FLUSH
10.0000 mL | Freq: Two times a day (BID) | INTRAVENOUS | Status: DC
  Administered 2021-11-19 – 2021-11-22 (×5): 10 mL
  Administered 2021-11-22: 30 mL
  Administered 2021-11-24: 10 mL
  Administered 2021-11-24: 20 mL

## 2021-11-19 NOTE — Progress Notes (Signed)
Initial Nutrition Assessment  DOCUMENTATION CODES:   Non-severe (moderate) malnutrition in context of chronic illness  INTERVENTION:   -Mighty Shake TID with meals, each supplement provides 220 kcals and 9 grams protein -MVI with minerals daily -500 mg Vitamin C BID -220 mg zinc sulfate daily x 14 days -Feeding assistance with meals  NUTRITION DIAGNOSIS:   Moderate Malnutrition related to chronic illness (Parkinson's) as evidenced by mild fat depletion, mild muscle depletion, moderate muscle depletion.  GOAL:   Patient will meet greater than or equal to 90% of their needs  MONITOR:   PO intake, Supplement acceptance, Labs, Weight trends, Skin, I & O's  REASON FOR ASSESSMENT:   Low Braden    ASSESSMENT:   Douglas Bodi. is a 71 y.o. Caucasian male with medical history significant for atrial fibrillation, BPH, CAD, hypothyroidism, cardiomyopathy, BPH and Parkinson's, who presented to the emergency room from [place] with left foot worsening wound despite chronic antibiotics at the facility with inability to care for the wound anymore.  No reported fever or chills.  No reported nausea or vomiting or abdominal pain.  No reported chest pain or dyspnea or cough or wheezing or hemoptysis.  No reported urinary symptoms.  Pt admitted with acute osteomyelitis of lt foot.   1/25- s/p Left fifth ray amputation  Reviewed I/O's: -702 ml x 24 hours and -234 ml since admission  UOP: 600 ml x 24 hours  Spoke with pt at bedside, who was sleepy at time of visit, but answered some simple questions. He reports feeling better today and states his foot does not hurt this morning. Observed breakfast tray- pt consumed 100%. Noted documented meal completions 60%. Pt w=is [facility] and reports eating well PTA, however, unable to provide detailed diet recall.   Reviewed wt hx; noted distant history of weight loss.   Discussed importance of good meal and  supplement intake to promote healing.   Medications reviewed and include sinemet, vitamin D, and 0.9% NaCl with KCl mEq/L infusion @ 100 ml/hr.   Labs reviewed: K: 3.4, Phos: 1.9, CBGS: 86-96.   NUTRITION - FOCUSED PHYSICAL EXAM:  Flowsheet Row Most Recent Value  Orbital Region Mild depletion  Upper Arm Region Mild depletion  Thoracic and Lumbar Region Mild depletion  Buccal Region Mild depletion  Temple Region Moderate depletion  Clavicle Bone Region No depletion  Clavicle and Acromion Bone Region No depletion  Scapular Bone Region No depletion  Dorsal Hand Mild depletion  Patellar Region Moderate depletion  Anterior Thigh Region Moderate depletion  Posterior Calf Region Moderate depletion  Edema (RD Assessment) None  Hair Reviewed  Eyes Reviewed  Mouth Reviewed  Skin Reviewed  Nails Reviewed       Diet Order:   Diet Order             Diet Carb Modified Fluid consistency: Thin; Room service appropriate? Yes  Diet effective now                   EDUCATION NEEDS:   Education needs have been addressed  Skin:  Skin Assessment: Skin Integrity Issues: Skin Integrity Issues:: Incisions, Other (Comment) Incisions: closed lt foot Other: pressure injury to buttocks  Last BM:  Unknown  Height:   Ht Readings from Last 1 Encounters:  11/17/21 6' (1.829 m)    Weight:   Wt Readings from Last 1 Encounters:  11/17/21 68.6 kg    Ideal Body Weight:  80.9 kg  BMI:  Body mass index is 20.52 kg/m.  Estimated Nutritional Needs:   Kcal:  2050-2250  Protein:  120-135 grams  Fluid:  > 2 L    Loistine Chance, RD, LDN, [place] Registered Dietitian II Certified Diabetes Care and Education Specialist Please refer to [facility] for RD and/or RD on-call/weekend/after hours pager

## 2021-11-19 NOTE — Progress Notes (Signed)
PROGRESS NOTE    Douglas Silva.  IHK:[id number] DOB: Mar 01, 1951 DOA: 11/17/2021 PCP: Remi Haggard, FNP   Brief Narrative:  The patient is a 71 year old thin chronically ill-appearing Caucasian male with a past medical history significant for but not limited to atrial fibrillation, BPH, CAD, hypothyroidism, history of cardiomyopathy, history of Parkinson's disease as well as other comorbidities who presented to [facility] with left foot worsening despite chronic antibiotics at the facility with inability to take care of the wound.  He had no reported chills or fevers and no nausea or vomiting.  When he came to the ED blood pressure 97/77 temperature 97.2.  Labs were reviewed and he had a hypokalemia and elevated LFTs.  CBC showed an elevated WBC of 10.7 with anemia.  Imaging of the left foot showed soft tissue infection and wound of the lateral aspect of the foot with underlying acute osteomyelitis involving the distal fifth metatarsal at the fifth proximal phalanx.  He was initially given IV cefepime, IV vancomycin and Flagyl and admitted to a medical bed for further management and podiatry was consulted.  Podiatry felt the patient would benefit from amputation and took the patient to the OR on 11/18/2021 for Fifth ray amputation with deep wound culture given his osteomyelitis..  Assessment & Plan:   Principal Problem:   Acute hematogenous osteomyelitis of right foot (HCC) Active Problems:   Malnutrition of moderate degree  Acute osteomyelitis of the left foot with abscess status post left fifth ray amputation and deep wound culture -The patient was admitted to a medical bed. -We will continue antibiotic therapy with IV vancomycin but change IV Zosyn to IV cefepime.  We will continue his antibiotics for now and consult ID for further assistance with antibiotics upon discharge -Podiatry was consulted and Dr. Asa Lente evaluated and he felt that the patient will need an I&D  and removal of all infected tissue and felt that this was likely need to be left open and packed; he also felt that this may lead to a second primary delayed primary closure down the road -He has been n.p.o. after midnight for surgical intervention later today -Currently getting NS + Kcl 20 mEQ at 100 mL/hr -Patient's WBC went from 10.7 is now 11.6 yesterday and has worsened to 12.9 -Continue with IV antibiotics as above and continue monitor and follow-up on podiatry recommendations and podiatry recommends closely monitoring to make sure the erythema is improving; no Milus purulence was noted today but Dr. Asa Lente did note a large amount of Arif purulent drainage in the operation -Podiatry recommends ID assistance so we will consult Dr. Delaine Lame in the morning  Paroxysmal atrial fibrillation currently rate controlled ventricular response. -C/w Amiodarone 200 mg po Daily -Hold AC given Surgical Intervention and will resume once okayed by a podiatry standpoint  Hypokalemia -Patient's potassium was 3.3 yesterday improved to 3.4 today -Replete again with IV KCl 40 mEq as well as IV K-Phos 15 mmol -Continue to monitor and replete as necessary -Repeat CMP in a.m.  Hypophosphatemia -Patient's Phos level was 1.9 -Replete with IV K-Phos 15 mmol -Continue to monitor and replete as necessary -Repeat phosphorus level in a.m.  Abnormal LFTs -Slightly worsening -Patient's AST went from 158 is now 135 today and has no further worsened to 170 -ALT went from 60 now 106 and is now 129 -We will stop his IV fluid hydration given concern of passive hepatic congestion -Continue to monitor and trend hepatic function carefully and if necessary will  obtain a right upper quadrant ultrasound as well as an acute hepatitis panel in the morning -Repeat CMP in a.m.  Hypothyroidism. -Continue Levothyroxine. -Check TSH in the morning  Dementia and Parkinson's disease. -Continue home Sinemet IR 25-100 mg 1 tab  p.o. 4 times daily as well as donepezil 10 mg p.o. daily -Has significant atrophy and muscle wasting in the setting of his Parkinson's disease -We will consult palliative care for goals of care discussion  Coronary artery disease. -Continue metoprolol tartrate 12.5 mg p.o. twice daily as well as simvastatin 20 mg p.o. nightly  Dyslipidemia. -Continue simvastatin 20 mg p.o. nightly  Normocytic Anemia -Patient's hemoglobin/hematocrit went from 12.8/39.3 and is now 12.1/37.0 yesterday and now is 12.6/37.9 -Check anemia panel in the a.m. -Continue to monitor for signs and symptoms bleeding; no overt bleeding noted -Repeat CBC in the a.m.  Moderate nonsevere malnutrition in the context of chronic illness -Nutritionist consulted for further evaluation recommendations -Estimated body mass index is 20.52 kg/m as calculated from the following:   Height as of this encounter: 6' (1.829 m).   Weight as of this encounter: 68.6 kg. -Discharged recommending mighty shakes 3 times daily with meals as well as multivitamin with minerals daily as well as 500 mg of vitamin C twice daily and to 20 mg of zinc sulfate daily for 14 days and feeding assistance with meals  DVT prophylaxis: SCDs and will resume his anticoagulation once okay from a podiatry perspective Code Status: FULL CODE Family Communication: No family present at bedside  Disposition Plan: Pending further clinical improvement and clearance by podiatry  Status is: Inpatient  Remains inpatient appropriate because: He will be undergoing surgical intervention today by podiatry  Consultants:  Podiatry  Infectious diseases  Procedures: None  Antimicrobials:  Anti-infectives (From admission, onward)    Start     Dose/Rate Route Frequency Ordered Stop   11/18/21 1400  ceFEPIme (MAXIPIME) 2 g in sodium chloride 0.9 % 100 mL IVPB        2 g 200 mL/hr over 30 Minutes Intravenous Every 8 hours 11/18/21 1001     11/18/21 0500  vancomycin  (VANCOREADY) IVPB 1500 mg/300 mL  Status:  Discontinued        1,500 mg 150 mL/hr over 120 Minutes Intravenous Every 12 hours 11/17/21 1611 11/17/21 1615   11/18/21 0500  vancomycin (VANCOCIN) IVPB 1000 mg/200 mL premix        1,000 mg 200 mL/hr over 60 Minutes Intravenous Every 12 hours 11/17/21 1615     11/17/21 1800  piperacillin-tazobactam (ZOSYN) IVPB 3.375 g  Status:  Discontinued        3.375 g 100 mL/hr over 30 Minutes Intravenous Every 6 hours 11/17/21 1559 11/17/21 1604   11/17/21 1700  vancomycin (VANCOREADY) IVPB 500 mg/100 mL        500 mg 100 mL/hr over 60 Minutes Intravenous  Once 11/17/21 1611 11/17/21 2249   11/17/21 1615  piperacillin-tazobactam (ZOSYN) IVPB 3.375 g  Status:  Discontinued        3.375 g 12.5 mL/hr over 240 Minutes Intravenous Every 8 hours 11/17/21 1606 11/18/21 1001   11/17/21 1600  vancomycin (VANCOCIN) IVPB 1000 mg/200 mL premix  Status:  Discontinued        1,000 mg 200 mL/hr over 60 Minutes Intravenous  Once 11/17/21 1557 11/17/21 1559   11/17/21 1600  ceFEPIme (MAXIPIME) 2 g in sodium chloride 0.9 % 100 mL IVPB  Status:  Discontinued  2 g 200 mL/hr over 30 Minutes Intravenous  Once 11/17/21 1557 11/17/21 1559   11/17/21 1415  ceFEPIme (MAXIPIME) 2 g in sodium chloride 0.9 % 100 mL IVPB        2 g 200 mL/hr over 30 Minutes Intravenous  Once 11/17/21 1409 11/17/21 1512   11/17/21 1415  metroNIDAZOLE (FLAGYL) IVPB 500 mg        500 mg 100 mL/hr over 60 Minutes Intravenous  Once 11/17/21 1409 11/17/21 1623   11/17/21 1415  vancomycin (VANCOCIN) IVPB 1000 mg/200 mL premix        1,000 mg 200 mL/hr over 60 Minutes Intravenous  Once 11/17/21 1409 11/17/21 1625        Subjective: Seen and examined at bedside and he was a little bit more awake and talkative today but is still very difficult to understand what he says sometimes.  States that he was in some pain.  No lightheadedness or dizziness.  No other concerns or plans at this  time.  Objective: Vitals:   11/18/21 1612 11/18/21 1655 11/19/21 0557 11/19/21 0759  BP:  (!) 132/109 (!) 145/62 139/66  Pulse: 66 64 72 79  Resp: 14 17 20 16   Temp: (!) 97 F (36.1 C) 98.1 F (36.7 C) 99.1 F (37.3 C) 98.8 F (37.1 C)  TempSrc:      SpO2: 95% 98% 100% 100%  Weight:      Height:        Intake/Output Summary (Last 24 hours) at 11/19/2021 1759 Last data filed at 11/19/2021 1600 Gross per 24 hour  Intake 240 ml  Output 1800 ml  Net -1560 ml    Filed Weights   11/17/21 1354  Weight: 68.6 kg   Examination: Physical Exam:  Constitutional: Thin chronically ill-appearing Caucasian male who is significantly contracted in the setting of his advanced Parkinson's disease and has significant muscle atrophy Eyes: Lids are normal and sclera anicteric ENMT: External Ears, Nose appear normal. Grossly normal hearing.  Neck: Neck is bent to the left with no appreciable JVD Respiratory: Diminished to auscultation bilaterally, no wheezing, rales, rhonchi or crackles. Normal respiratory effort and patient is not tachypenic. No accessory muscle use.  Unlabored breathing Cardiovascular: RRR, no murmurs / rubs / gallops. S1 and S2 auscultated.   Abdomen: Soft, non-tender, non-distended. Bowel sounds positive.  GU: Deferred. Musculoskeletal: Has contractures and muscle atrophy in his right foot is wrapped Skin: Right foot is bandaged. No induration; Warm and dry.  Neurologic: He is significantly contracted and has muscle atrophy and his head is bent to the left; he is very soft-spoken a little difficult to understand Psychiatric: Normal judgment and insight. Alert and awake.  Normal mood and appropriate affect.   Data Reviewed: I have personally reviewed following labs and imaging studies  CBC: Recent Labs  Lab 11/17/21 1406 11/18/21 0736 11/19/21 1045  WBC 10.7* 11.6* 12.9*  NEUTROABS 7.9*  --  9.4*  HGB 12.8* 12.1* 12.6*  HCT 39.3 37.0* 37.9*  MCV 95.6 93.2 92.0   PLT 300 300 621    Basic Metabolic Panel: Recent Labs  Lab 11/17/21 1406 11/18/21 0500 11/18/21 0736 11/19/21 1045  NA 137  --  137 138  K 2.9*  --  3.3* 3.4*  CL 103  --  103 103  CO2 29  --  28 29  GLUCOSE 125*  --  101* 103*  BUN 18  --  19 11  CREATININE 0.51*  --  0.61 0.52*  CALCIUM  8.3*  --  8.2* 8.6*  MG  --  1.9  --  1.7  PHOS  --  2.2*  --  1.9*    GFR: Estimated Creatinine Clearance: 84.6 mL/min (A) (by C-G formula based on SCr of 0.52 mg/dL (L)). Liver Function Tests: Recent Labs  Lab 11/17/21 1406 11/18/21 0500 11/19/21 1045  AST 158* 135* 170*  ALT 60* 106* 129*  ALKPHOS 65 57 61  BILITOT 0.7 0.8 1.0  PROT 5.7* 5.3* 5.8*  ALBUMIN 2.5* 2.3* 2.5*    No results for input(s): LIPASE, AMYLASE in the last 168 hours. No results for input(s): AMMONIA in the last 168 hours. Coagulation Profile: Recent Labs  Lab 11/17/21 1406  INR 1.5*    Cardiac Enzymes: No results for input(s): CKTOTAL, CKMB, CKMBINDEX, TROPONINI in the last 168 hours. BNP (last 3 results) No results for input(s): PROBNP in the last 8760 hours. HbA1C: No results for input(s): HGBA1C in the last 72 hours. CBG: Recent Labs  Lab 11/18/21 1849 11/18/21 2013  GLUCAP 86 96   Lipid Profile: No results for input(s): CHOL, HDL, LDLCALC, TRIG, CHOLHDL, LDLDIRECT in the last 72 hours. Thyroid Function Tests: No results for input(s): TSH, T4TOTAL, FREET4, T3FREE, THYROIDAB in the last 72 hours. Anemia Panel: No results for input(s): VITAMINB12, FOLATE, FERRITIN, TIBC, IRON, RETICCTPCT in the last 72 hours. Sepsis Labs: Recent Labs  Lab 11/17/21 1406  LATICACIDVEN 1.3     Recent Results (from the past 240 hour(s))  Blood Culture (routine x 2)     Status: None (Preliminary result)   Collection Time: 11/17/21  2:06 PM   Specimen: BLOOD  Result Value Ref Range Status   Specimen Description BLOOD PICC LINE  Final   Special Requests BOTTLES DRAWN AEROBIC AND ANAEROBIC BCAV  Final    Culture   Final    NO GROWTH 2 DAYS Performed at [facility], [address] [place],  [address]    Report Status PENDING  Incomplete  Resp Panel by RT-PCR (Flu A&B, Covid) Nasopharyngeal Swab     Status: None   Collection Time: 11/17/21  2:42 PM   Specimen: Nasopharyngeal Swab; Nasopharyngeal(NP) swabs in vial transport medium  Result Value Ref Range Status   SARS Coronavirus 2 by RT PCR NEGATIVE NEGATIVE Final    Comment: (NOTE) SARS-CoV-2 target nucleic acids are NOT DETECTED.  The SARS-CoV-2 RNA is generally detectable in upper respiratory specimens during the acute phase of infection. The lowest concentration of SARS-CoV-2 viral copies this assay can detect is 138 copies/mL. A negative result does not preclude SARS-Cov-2 infection and should not be used as the sole basis for treatment or other patient management decisions. A negative result may occur with  improper specimen collection/handling, submission of specimen other than nasopharyngeal swab, presence of viral mutation(s) within the areas targeted by this assay, and inadequate number of viral copies(<138 copies/mL). A negative result must be combined with clinical observations, patient history, and epidemiological information. The expected result is Negative.  Fact Sheet for Patients:  EntrepreneurPulse.com.au  Fact Sheet for Healthcare Providers:  IncredibleEmployment.be  This test is no t yet approved or cleared by the [place] FDA and  has been authorized for detection and/or diagnosis of SARS-CoV-2 by FDA under an Emergency Use Authorization (EUA). This EUA will remain  in effect (meaning this test can be used) for the duration of the COVID-19 declaration under Section 564(b)(1) of the Act, 21 U.S.C.section 360bbb-3(b)(1), unless the authorization is terminated  or  revoked sooner.       Influenza A by PCR NEGATIVE NEGATIVE Final   Influenza B by  PCR NEGATIVE NEGATIVE Final    Comment: (NOTE) The Xpert Xpress SARS-CoV-2/FLU/RSV plus assay is intended as an aid in the diagnosis of influenza from Nasopharyngeal swab specimens and should not be used as a sole basis for treatment. Nasal washings and aspirates are unacceptable for Xpert Xpress SARS-CoV-2/FLU/RSV testing.  Fact Sheet for Patients: EntrepreneurPulse.com.au  Fact Sheet for Healthcare Providers: IncredibleEmployment.be  This test is not yet approved or cleared by the [place] FDA and has been authorized for detection and/or diagnosis of SARS-CoV-2 by FDA under an Emergency Use Authorization (EUA). This EUA will remain in effect (meaning this test can be used) for the duration of the COVID-19 declaration under Section 564(b)(1) of the Act, 21 U.S.C. section 360bbb-3(b)(1), unless the authorization is terminated or revoked.  Performed at [facility], [address], [place], [place] [address]   Blood Culture (routine x 2)     Status: None (Preliminary result)   Collection Time: 11/17/21  2:42 PM   Specimen: BLOOD  Result Value Ref Range Status   Specimen Description BLOOD ARM  Final   Special Requests BOTTLES DRAWN AEROBIC AND ANAEROBIC BCAV  Final   Culture   Final    NO GROWTH 2 DAYS Performed at [facility], [address], [place], [place] [address]    Report Status PENDING  Incomplete  Aerobic/Anaerobic Culture w Gram Stain (surgical/deep wound)     Status: None (Preliminary result)   Collection Time: 11/17/21  6:33 PM   Specimen: Foot; Abscess  Result Value Ref Range Status   Specimen Description   Final    FOOT Performed at [facility], [address], [place], [place] [address]    Special Requests   Final    LEFT Performed at [facility], [address] [place], [place] [address]    Gram Stain   Final    MODERATE WBC PRESENT,BOTH PMN AND MONONUCLEAR MODERATE  GRAM POSITIVE COCCI RARE GRAM NEGATIVE RODS    Culture   Final    FEW STAPHYLOCOCCUS AUREUS SUSCEPTIBILITIES TO FOLLOW Performed at [facility], [place] [address] [place], [place] [address]    Report Status PENDING  Incomplete  Aerobic/Anaerobic Culture w Gram Stain (surgical/deep wound)     Status: None (Preliminary result)   Collection Time: 11/18/21  3:06 PM   Specimen: PATH Other; Tissue  Result Value Ref Range Status   Specimen Description   Final    ABSCESS Performed at [facility], [address] [place], [place] [address]    Special Requests   Final    LEFT FOOT Performed at [facility], [address] [place], [place] [address]    Gram Stain   Final    NO SQUAMOUS EPITHELIAL CELLS SEEN FEW WBC SEEN FEW GRAM POSITIVE COCCI    Culture   Final    TOO YOUNG TO READ Performed at [facility], [place] [address] [place], [place] [address]    Report Status PENDING  Incomplete      RN Pressure Injury Documentation: Pressure Injury 11/18/21 Buttocks (Active)  11/18/21   Location: Buttocks  Location Orientation:   Staging:   Wound Description (Comments):   Present on Admission: Yes     Estimated body mass index is 20.52 kg/m as calculated from the following:   Height as of this encounter: 6' (  1.829 m).   Weight as of this encounter: 68.6 kg.  Malnutrition Type: Nutrition Problem: Moderate Malnutrition Etiology: chronic illness (Parkinson's) Malnutrition Characteristics: Signs/Symptoms: mild fat depletion, mild muscle depletion, moderate muscle depletion Nutrition Interventions: Interventions: MVI, Hormel Shake Radiology Studies: DG MINI C-ARM IMAGE ONLY  Result Date: 11/18/2021 There is no interpretation for this exam.  This order is for images obtained during a surgical procedure.  Please See "Surgeries" Tab for more information regarding the procedure.    Scheduled Meds:  amiodarone  200 mg Oral Daily   vitamin C  500 mg  Oral BID   carbidopa-levodopa  1 tablet Oral QID   Chlorhexidine Gluconate Cloth  6 each Topical Daily   donepezil  10 mg Oral Daily   levothyroxine  100 mcg Oral Q0600   metoprolol tartrate  12.5 mg Oral BID   midodrine  5 mg Oral TID WC   multivitamin with minerals  1 tablet Oral Daily   simvastatin  20 mg Oral QHS   Vitamin D (Ergocalciferol)  50,000 Units Oral Q7 days   zinc sulfate  220 mg Oral Daily   Continuous Infusions:  0.9 % NaCl with KCl 20 mEq / L 100 mL/hr at 11/19/21 1242   ceFEPime (MAXIPIME) IV 2 g (11/19/21 1041)   potassium PHOSPHATE IVPB (in mmol) 15 mmol (11/19/21 1452)   vancomycin 1,000 mg (11/19/21 0553)    LOS: 2 days   Kerney Elbe, DO [facility] PAGER is on AMION  If 7PM-7AM, please contact night-coverage [email or web address]

## 2021-11-19 NOTE — Plan of Care (Signed)

## 2021-11-19 NOTE — Progress Notes (Signed)
Daily Progress Note   Subjective  - 1 Day Post-Op  Status post left fifth ray amputation.  Patient does not have complaints today.  Awake and alert today  Objective Vitals:   11/18/21 1612 11/18/21 1655 11/19/21 0557 11/19/21 0759  BP:  (!) 132/109 (!) 145/62 139/66  Pulse: 66 64 72 79  Resp: 14 17 20 16   Temp: (!) 97 F (36.1 C) 98.1 F (36.7 C) 99.1 F (37.3 C) 98.8 F (37.1 C)  TempSrc:      SpO2: 95% 98% 100% 100%  Weight:      Height:        Physical Exam: The dorsal incision is coapted.  The packing was removed from the plantar incision.  Erythema is improved at this time.  No Aswad purulence today.  Intraoperatively there was a large amount of Orlin purulence with obvious osteomyelitis to the metatarsal head.       Laboratory CBC    Component Value Date/Time   WBC 12.9 (H) 11/19/2021 1045   HGB 12.6 (L) 11/19/2021 1045   HCT 37.9 (L) 11/19/2021 1045   PLT 316 11/19/2021 1045    BMET    Component Value Date/Time   NA 138 11/19/2021 1045   K 3.4 (L) 11/19/2021 1045   CL 103 11/19/2021 1045   CO2 29 11/19/2021 1045   GLUCOSE 103 (H) 11/19/2021 1045   BUN 11 11/19/2021 1045   CREATININE 0.52 (L) 11/19/2021 1045   CALCIUM 8.6 (L) 11/19/2021 1045   GFRNONAA >60 11/19/2021 1045   GFRAA >60 04/09/2020 0406    Assessment/Planning: Status post fifth ray amputation for osteomyelitis  New dressing was applied today.  We will need to just closely monitor to make sure the erythema is improving.  The wound was stable today.  No Felipe purulence was noted. Podiatry to follow. Recommend ID consult for assistance with antibiotics upon discharge  Elesa Hacker  11/19/2021, 12:20 PM

## 2021-11-20 DIAGNOSIS — M86071 Acute hematogenous osteomyelitis, right ankle and foot: Secondary | ICD-10-CM | POA: Diagnosis not present

## 2021-11-20 DIAGNOSIS — L089 Local infection of the skin and subcutaneous tissue, unspecified: Secondary | ICD-10-CM

## 2021-11-20 DIAGNOSIS — G20 Parkinson's disease: Secondary | ICD-10-CM

## 2021-11-20 DIAGNOSIS — F02818 Dementia in other diseases classified elsewhere, unspecified severity, with other behavioral disturbance: Secondary | ICD-10-CM | POA: Diagnosis not present

## 2021-11-20 DIAGNOSIS — E440 Moderate protein-calorie malnutrition: Secondary | ICD-10-CM | POA: Diagnosis not present

## 2021-11-20 DIAGNOSIS — B9561 Methicillin susceptible Staphylococcus aureus infection as the cause of diseases classified elsewhere: Secondary | ICD-10-CM | POA: Diagnosis not present

## 2021-11-20 DIAGNOSIS — M86172 Other acute osteomyelitis, left ankle and foot: Secondary | ICD-10-CM | POA: Diagnosis not present

## 2021-11-20 LAB — CBC WITH DIFFERENTIAL/PLATELET
Abs Immature Granulocytes: 0.05 10*3/uL (ref 0.00–0.07)
Basophils Absolute: 0.1 10*3/uL (ref 0.0–0.1)
Basophils Relative: 1 %
Eosinophils Absolute: 0.1 10*3/uL (ref 0.0–0.5)
Eosinophils Relative: 1 %
HCT: 42.3 % (ref 39.0–52.0)
Hemoglobin: 13.8 g/dL (ref 13.0–17.0)
Immature Granulocytes: 1 %
Lymphocytes Relative: 19 %
Lymphs Abs: 2 10*3/uL (ref 0.7–4.0)
MCH: 30.7 pg (ref 26.0–34.0)
MCHC: 32.6 g/dL (ref 30.0–36.0)
MCV: 94.2 fL (ref 80.0–100.0)
Monocytes Absolute: 0.9 10*3/uL (ref 0.1–1.0)
Monocytes Relative: 9 %
Neutro Abs: 7.4 10*3/uL (ref 1.7–7.7)
Neutrophils Relative %: 69 %
Platelets: 290 10*3/uL (ref 150–400)
RBC: 4.49 MIL/uL (ref 4.22–5.81)
RDW: 14.9 % (ref 11.5–15.5)
WBC: 10.6 10*3/uL — ABNORMAL HIGH (ref 4.0–10.5)
nRBC: 0 % (ref 0.0–0.2)

## 2021-11-20 LAB — RETICULOCYTES
Immature Retic Fract: 9 % (ref 2.3–15.9)
RBC.: 4.39 MIL/uL (ref 4.22–5.81)
Retic Count, Absolute: 72.9 10*3/uL (ref 19.0–186.0)
Retic Ct Pct: 1.7 % (ref 0.4–3.1)

## 2021-11-20 LAB — COMPREHENSIVE METABOLIC PANEL
ALT: 73 U/L — ABNORMAL HIGH (ref 0–44)
AST: 149 U/L — ABNORMAL HIGH (ref 15–41)
Albumin: 2.7 g/dL — ABNORMAL LOW (ref 3.5–5.0)
Alkaline Phosphatase: 72 U/L (ref 38–126)
Anion gap: 6 (ref 5–15)
BUN: 12 mg/dL (ref 8–23)
CO2: 30 mmol/L (ref 22–32)
Calcium: 8.5 mg/dL — ABNORMAL LOW (ref 8.9–10.3)
Chloride: 103 mmol/L (ref 98–111)
Creatinine, Ser: 0.66 mg/dL (ref 0.61–1.24)
GFR, Estimated: >60 mL/min (ref >60)
Glucose, Bld: 113 mg/dL — ABNORMAL HIGH (ref 70–99)
Potassium: 3.8 mmol/L (ref 3.5–5.1)
Sodium: 139 mmol/L (ref 135–145)
Total Bilirubin: 0.8 mg/dL (ref 0.3–1.2)
Total Protein: 6.3 g/dL — ABNORMAL LOW (ref 6.5–8.1)

## 2021-11-20 LAB — IRON AND TIBC
Iron: 57 ug/dL (ref 45–182)
Saturation Ratios: 26 % (ref 17.9–39.5)
TIBC: 220 ug/dL — ABNORMAL LOW (ref 250–450)
UIBC: 163 ug/dL

## 2021-11-20 LAB — PHOSPHORUS: Phosphorus: 2 mg/dL — ABNORMAL LOW (ref 2.5–4.6)

## 2021-11-20 LAB — VITAMIN B12: Vitamin B-12: 548 pg/mL (ref 180–914)

## 2021-11-20 LAB — MAGNESIUM: Magnesium: 2.2 mg/dL (ref 1.7–2.4)

## 2021-11-20 LAB — FOLATE: Folate: 9.5 ng/mL (ref >5.9)

## 2021-11-20 LAB — FERRITIN: Ferritin: 510 ng/mL — ABNORMAL HIGH (ref 24–336)

## 2021-11-20 MED ORDER — POTASSIUM CHLORIDE 10 MEQ/100ML IV SOLN
10.0000 meq | Freq: Once | INTRAVENOUS | Status: AC
  Administered 2021-11-20: 10 meq via INTRAVENOUS
  Filled 2021-11-20: qty 100

## 2021-11-20 MED ORDER — CEFAZOLIN SODIUM-DEXTROSE 2-4 GM/100ML-% IV SOLN
2.0000 g | Freq: Three times a day (TID) | INTRAVENOUS | Status: DC
  Administered 2021-11-20 – 2021-11-24 (×10): 2 g via INTRAVENOUS
  Filled 2021-11-20 (×11): qty 100

## 2021-11-20 MED ORDER — POTASSIUM PHOSPHATES 15 MMOLE/5ML IV SOLN
30.0000 mmol | Freq: Once | INTRAVENOUS | Status: AC
  Administered 2021-11-20: 30 mmol via INTRAVENOUS
  Filled 2021-11-20: qty 10

## 2021-11-20 NOTE — Progress Notes (Signed)
PROGRESS NOTE    Douglas Silva.  ZOX:[id number] DOB: [birth year] DOA: 11/17/2021 PCP: Remi Haggard, FNP   Brief Narrative:  The patient is a 71 year old thin chronically ill-appearing Caucasian male with a past medical history significant for but not limited to atrial fibrillation, BPH, CAD, hypothyroidism, history of cardiomyopathy, history of Parkinson's disease as well as other comorbidities who presented to [facility] with left foot worsening despite chronic antibiotics at the facility with inability to take care of the wound.  He had no reported chills or fevers and no nausea or vomiting.  When he came to the ED blood pressure 97/77 temperature 97.2.  Labs were reviewed and he had a hypokalemia and elevated LFTs.  CBC showed an elevated WBC of 10.7 with anemia.  Imaging of the left foot showed soft tissue infection and wound of the lateral aspect of the foot with underlying acute osteomyelitis involving the distal fifth metatarsal at the fifth proximal phalanx.  He was initially given IV cefepime, IV vancomycin and Flagyl and admitted to a medical bed for further management and podiatry was consulted.  Podiatry felt the patient would benefit from amputation and took the patient to the OR on 11/18/2021 for Fifth ray amputation with deep wound culture given his osteomyelitis.  ID has been consulted for further antibiotic management as he has MSSA in his culture and given changes Banken cefepime to cefazolin.  Decide on the duration and route once that the pathology and also assess the wound with the podiatrist.  Palliative care has been consulted for further goals of care discussion and will see the patient on Monday, 11/23/2021.  Will await further cultures for ID to evaluate and make recommendations  Assessment & Plan:   Principal Problem:   Acute hematogenous osteomyelitis of right foot ([place]) Active Problems:   Malnutrition of moderate degree  Acute osteomyelitis of  the left foot with abscess status post left fifth ray amputation and deep wound culture -The patient was admitted to a medical bed. -We will continue antibiotic therapy with IV vancomycin but change IV Zosyn to IV cefepime.  We will continue his antibiotics for now and consult ID for further assistance with antibiotics upon discharge -Podiatry was consulted and Dr. Vickki Muff evaluated and he felt that the patient will need an I&D and removal of all infected tissue and felt that this was likely need to be left open and packed; he also felt that this may lead to a second primary delayed primary closure down the road -He has been n.p.o. after midnight for surgical intervention later today -IV fluid hydration. -Patient's WBC went from 10.7 is now 11.6 yesterday and has worsened to 12.9 yesterday but is now improved to 10 point -Continue with IV antibiotics as above and continue monitor and follow-up on podiatry recommendations and podiatry recommends closely monitoring to make sure the erythema is improving; no Macallister purulence was noted today but Dr. Vickki Muff did note a large amount of [place]en purulent drainage in the operation -Podiatry recommends ID assistance so we will consult Dr. Delaine Lame and she is changed to Vanco cefepime to IV cefazolin for now and will decide on duration and route once the pathology as well as the assessment of the wound with a podiatrist  -Podiatry evaluated and he had some moderate drainage with some slight dehiscence at the proximal aspect of the incision with brownish purulent drainage.  There cannot add Betadine and a sterile dressing and discussed with the made need to reI&D  the foot/other remaining infection and they can reevaluate the wound tomorrow if no improvement likely plan for an I&D on "Sunday  Paroxysmal atrial fibrillation currently rate controlled ventricular response. -C/w Amiodarone 200 mg po Daily -Hold AC given Surgical Intervention and will resume once okayed by  a podiatry standpoint and will still hold given that he may undergo repeat I&D on Sunday  Hypokalemia -Patient's potassium was 3.4 yesterday improved to 3.8 today -Continue to monitor and replete as necessary -Repeat CMP in a.m.  Hypophosphatemia -Patient's Phos level was 2.0 -Replete with IV K-Phos 30 mmol -Continue to monitor and replete as necessary -Repeat phosphorus level in a.m.  Abnormal LFTs -Slightly worsening -Patient's AST went from 158 is now 135 today and has no further worsened to 170 has now improved to 149 -ALT went from 60 now 106 and is now 129 is now improved to 73 -We will stop his IV fluid hydration given concern of passive hepatic congestion -Continue to monitor and trend hepatic function carefully and if necessary will obtain a right upper quadrant ultrasound as well as an acute hepatitis panel in the morning -Repeat CMP in a.m.  Hypothyroidism. -Continue Levothyroxine. -Check TSH in the morning  Dementia and Parkinson's disease. -Continue home Sinemet IR 25-100 mg 1 tab p.o. 4 times daily as well as donepezil 10 mg p.o. daily -Has significant atrophy and muscle wasting in the setting of his Parkinson's disease -We will consult palliative care for goals of care discussion and this is to happen on Monday, 11/23/2020  Coronary artery disease. -Continue metoprolol tartrate 12.5 mg p.o. twice daily as well as simvastatin 20 mg p.o. nightly  Dyslipidemia. -Continue simvastatin 20 mg p.o. nightly  Normocytic Anemia -Patient's hemoglobin/hematocrit went from 12.8/39.3 -> 12.1/37.0 -> 12.6/37.9 -> 13.8/42.3 -Anemia panel was checked and showed an iron level of 57, U IBC 163, TIBC 220, saturation ratios of 26%, ferritin level of 510, folate level of 9.5, vitamin B12 548 -Continue to monitor for signs and symptoms bleeding; no overt bleeding noted -Repeat CBC in the a.m.  Moderate nonsevere malnutrition in the context of chronic illness -Nutritionist consulted  for further evaluation recommendations -Estimated body mass index is 20.52 kg/m as calculated from the following:   Height as of this encounter: 6' (1.829 m).   Weight as of this encounter: 68.6 kg. -Discharged recommending mighty shakes 3 times daily with meals as well as multivitamin with minerals daily as well as 500 mg of vitamin C twice daily and to 20 mg of zinc sulfate daily for 14 days and feeding assistance with meals  DVT prophylaxis: SCDs and will resume his anticoagulation once okay from a podiatry perspective and will continue to hold for now given that he may undergo repeat I&D Code Status: FULL CODE Family Communication: No family present at bedside  Disposition Plan: Pending further clinical improvement and clearance by podiatry  Status is: Inpatient  Remains inpatient appropriate because: He will be undergoing surgical intervention today by podiatry  Consultants:  Podiatry  Infectious diseases Palliative care  Procedures: None  Antimicrobials:  Anti-infectives (From admission, onward)    Start     Dose/Rate Route Frequency Ordered Stop   11/20/21 1800  ceFAZolin (ANCEF) IVPB 2g/100 mL premix        2 g 200 mL/hr over 30 Minutes Intravenous Every 8 hours 11/20/21 1547     01" /25/23 1400  ceFEPIme (MAXIPIME) 2 g in sodium chloride 0.9 % 100 mL IVPB  Status:  Discontinued  2 g 200 mL/hr over 30 Minutes Intravenous Every 8 hours 11/18/21 1001 11/20/21 1547   11/18/21 0500  vancomycin (VANCOREADY) IVPB 1500 mg/300 mL  Status:  Discontinued        1,500 mg 150 mL/hr over 120 Minutes Intravenous Every 12 hours 11/17/21 1611 11/17/21 1615   11/18/21 0500  vancomycin (VANCOCIN) IVPB 1000 mg/200 mL premix  Status:  Discontinued        1,000 mg 200 mL/hr over 60 Minutes Intravenous Every 12 hours 11/17/21 1615 11/20/21 1547   11/17/21 1800  piperacillin-tazobactam (ZOSYN) IVPB 3.375 g  Status:  Discontinued        3.375 g 100 mL/hr over 30 Minutes Intravenous Every  6 hours 11/17/21 1559 11/17/21 1604   11/17/21 1700  vancomycin (VANCOREADY) IVPB 500 mg/100 mL        500 mg 100 mL/hr over 60 Minutes Intravenous  Once 11/17/21 1611 11/17/21 2249   11/17/21 1615  piperacillin-tazobactam (ZOSYN) IVPB 3.375 g  Status:  Discontinued        3.375 g 12.5 mL/hr over 240 Minutes Intravenous Every 8 hours 11/17/21 1606 11/18/21 1001   11/17/21 1600  vancomycin (VANCOCIN) IVPB 1000 mg/200 mL premix  Status:  Discontinued        1,000 mg 200 mL/hr over 60 Minutes Intravenous  Once 11/17/21 1557 11/17/21 1559   11/17/21 1600  ceFEPIme (MAXIPIME) 2 g in sodium chloride 0.9 % 100 mL IVPB  Status:  Discontinued        2 g 200 mL/hr over 30 Minutes Intravenous  Once 11/17/21 1557 11/17/21 1559   11/17/21 1415  ceFEPIme (MAXIPIME) 2 g in sodium chloride 0.9 % 100 mL IVPB        2 g 200 mL/hr over 30 Minutes Intravenous  Once 11/17/21 1409 11/17/21 1512   11/17/21 1415  metroNIDAZOLE (FLAGYL) IVPB 500 mg        500 mg 100 mL/hr over 60 Minutes Intravenous  Once 11/17/21 1409 11/17/21 1623   11/17/21 1415  vancomycin (VANCOCIN) IVPB 1000 mg/200 mL premix        1,000 mg 200 mL/hr over 60 Minutes Intravenous  Once 11/17/21 1409 11/17/21 1625        Subjective: Seen and examined at bedside and he is much more awake and alert but he was asked significantly confused today and his Parkinson's was really acting up as he had uncontrolled tremors.  He thought he was getting "sentence for first-degree murder" today.  No nausea or vomiting.  Was little agitated.  Pain was improved though however.  No other concerns at [place] time.  Objective: Vitals:   11/20/21 0459 11/20/21 0828 11/20/21 1141 11/20/21 1613  BP: 101/66 95/61 (!) 141/51 (!) 145/62  Pulse: (!) 58 61 70 77  Resp: 16 16 20 18   Temp: 97.8 F (36.6 C) 97.9 F (36.6 C) 97.8 F (36.6 C) 97.9 F (36.6 C)  TempSrc:      SpO2: 98% 100% 97% 98%  Weight:      Height:        Intake/Output Summary (Last 24  hours) at 11/20/2021 1917 Last data filed at 11/20/2021 1500 Gross per 24 hour  Intake 1498.6 ml  Output 1850 ml  Net -351.4 ml    Filed Weights   11/17/21 1354  Weight: 68.6 kg   Examination: Physical Exam:  Constitutional: Thin chronically ill-appearing Caucasian male who is significantly contracted and tremulous today.  He is extremely confused this morning Eyes: Lids  and conjunctivae normal, sclerae anicteric  ENMT: External Ears, Nose appear normal. Grossly normal hearing.   Neck: Appears normal, supple, no cervical masses, normal ROM, no appreciable thyromegaly; no appreciable JVD Respiratory: Diminished to auscultation bilaterally, no wheezing, rales, rhonchi or crackles. Normal respiratory effort and patient is not tachypenic. No accessory muscle use.  Unlabored breathing Cardiovascular: RRR, no murmurs / rubs / gallops. 2+ pedal pulses. No carotid bruits.  Abdomen: Soft, non-tender, non-distended. Bowel sounds positive.  GU: Deferred. Musculoskeletal: No clubbing / cyanosis of digits/nails. No joint deformity upper and lower extremities.  Skin: Right foot was bandaged. No induration; Warm and dry.  Neurologic: He is extremely tremulous today and has contractures and muscle atrophy.  He is also spoken but he is little bit more understandable today Psychiatric: Impaired judgment and insight.  He is confused and disoriented.  Slightly agitated.   Data Reviewed: I have personally reviewed following labs and imaging studies  CBC: Recent Labs  Lab 11/17/21 1406 11/18/21 0736 11/19/21 1045 11/20/21 0722  WBC 10.7* 11.6* 12.9* 10.6*  NEUTROABS 7.9*  --  9.4* 7.4  HGB 12.8* 12.1* 12.6* 13.8  HCT 39.3 37.0* 37.9* 42.3  MCV 95.6 93.2 92.0 94.2  PLT 300 300 316 027    Basic Metabolic Panel: Recent Labs  Lab 11/17/21 1406 11/18/21 0500 11/18/21 0736 11/19/21 1045 11/20/21 0722  NA 137  --  137 138 139  K 2.9*  --  3.3* 3.4* 3.8  CL 103  --  103 103 103  CO2 29  --   28 29 30   GLUCOSE 125*  --  101* 103* 113*  BUN 18  --  19 11 12   CREATININE 0.51*  --  0.61 0.52* 0.66  CALCIUM 8.3*  --  8.2* 8.6* 8.5*  MG  --  1.9  --  1.7 2.2  PHOS  --  2.2*  --  1.9* 2.0*    GFR: Estimated Creatinine Clearance: 84.6 mL/min (by C-G formula based on SCr of 0.66 mg/dL). Liver Function Tests: Recent Labs  Lab 11/17/21 1406 11/18/21 0500 11/19/21 1045 11/20/21 0722  AST 158* 135* 170* 149*  ALT 60* 106* 129* 73*  ALKPHOS 65 57 61 72  BILITOT 0.7 0.8 1.0 0.8  PROT 5.7* 5.3* 5.8* 6.3*  ALBUMIN 2.5* 2.3* 2.5* 2.7*    No results for input(s): LIPASE, AMYLASE in the last 168 hours. No results for input(s): AMMONIA in the last 168 hours. Coagulation Profile: Recent Labs  Lab 11/17/21 1406  INR 1.5*    Cardiac Enzymes: No results for input(s): CKTOTAL, CKMB, CKMBINDEX, TROPONINI in the last 168 hours. BNP (last 3 results) No results for input(s): PROBNP in the last 8760 hours. HbA1C: No results for input(s): HGBA1C in the last 72 hours. CBG: Recent Labs  Lab 11/18/21 1849 11/18/21 2013  GLUCAP 86 96    Lipid Profile: No results for input(s): CHOL, HDL, LDLCALC, TRIG, CHOLHDL, LDLDIRECT in the last 72 hours. Thyroid Function Tests: No results for input(s): TSH, T4TOTAL, FREET4, T3FREE, THYROIDAB in the last 72 hours. Anemia Panel: Recent Labs    11/20/21 0722  VITAMINB12 548  FOLATE 9.5  FERRITIN 510*  TIBC 220*  IRON 57  RETICCTPCT 1.7   Sepsis Labs: Recent Labs  Lab 11/17/21 1406  LATICACIDVEN 1.3     Recent Results (from the past 240 hour(s))  Blood Culture (routine x 2)     Status: None (Preliminary result)   Collection Time: 11/17/21  2:06 PM  Specimen: BLOOD  Result Value Ref Range Status   Specimen Description BLOOD PICC LINE  Final   Special Requests BOTTLES DRAWN AEROBIC AND ANAEROBIC BCAV  Final   Culture   Final    NO GROWTH 3 DAYS Performed at [facility], [address] [place], [place] [address]     Report Status PENDING  Incomplete  Resp Panel by RT-PCR (Flu A&B, Covid) Nasopharyngeal Swab     Status: None   Collection Time: 11/17/21  2:42 PM   Specimen: Nasopharyngeal Swab; Nasopharyngeal(NP) swabs in vial transport medium  Result Value Ref Range Status   SARS Coronavirus 2 by RT PCR NEGATIVE NEGATIVE Final    Comment: (NOTE) SARS-CoV-2 target nucleic acids are NOT DETECTED.  The SARS-CoV-2 RNA is generally detectable in upper respiratory specimens during the acute phase of infection. The lowest concentration of SARS-CoV-2 viral copies this assay can detect is 138 copies/mL. A negative result does not preclude SARS-Cov-2 infection and should not be used as the sole basis for treatment or other patient management decisions. A negative result may occur with  improper specimen collection/handling, submission of specimen other than nasopharyngeal swab, presence of viral mutation(s) within the areas targeted by this assay, and inadequate number of viral copies(<138 copies/mL). A negative result must be combined with clinical observations, patient history, and epidemiological information. The expected result is Negative.  Fact Sheet for Patients:  EntrepreneurPulse.com.au  Fact Sheet for Healthcare Providers:  IncredibleEmployment.be  This test is no t yet approved or cleared by the [place] FDA and  has been authorized for detection and/or diagnosis of SARS-CoV-2 by FDA under an Emergency Use Authorization (EUA). This EUA will remain  in effect (meaning this test can be used) for the duration of the COVID-19 declaration under Section 564(b)(1) of the Act, 21 U.S.C.section 360bbb-3(b)(1), unless the authorization is terminated  or revoked sooner.       Influenza A by PCR NEGATIVE NEGATIVE Final   Influenza B by PCR NEGATIVE NEGATIVE Final    Comment: (NOTE) The Xpert Xpress SARS-CoV-2/FLU/RSV plus assay is intended as an aid in the  diagnosis of influenza from Nasopharyngeal swab specimens and should not be used as a sole basis for treatment. Nasal washings and aspirates are unacceptable for Xpert Xpress SARS-CoV-2/FLU/RSV testing.  Fact Sheet for Patients: EntrepreneurPulse.com.au  Fact Sheet for Healthcare Providers: IncredibleEmployment.be  This test is not yet approved or cleared by the [place] FDA and has been authorized for detection and/or diagnosis of SARS-CoV-2 by FDA under an Emergency Use Authorization (EUA). This EUA will remain in effect (meaning this test can be used) for the duration of the COVID-19 declaration under Section 564(b)(1) of the Act, 21 U.S.C. section 360bbb-3(b)(1), unless the authorization is terminated or revoked.  Performed at [facility], [address], [place], [place] [address]   Blood Culture (routine x 2)     Status: None (Preliminary result)   Collection Time: 11/17/21  2:42 PM   Specimen: BLOOD  Result Value Ref Range Status   Specimen Description BLOOD ARM  Final   Special Requests BOTTLES DRAWN AEROBIC AND ANAEROBIC BCAV  Final   Culture   Final    NO GROWTH 3 DAYS Performed at [facility], [address] [place], [place] [address]    Report Status PENDING  Incomplete  Aerobic/Anaerobic Culture w Gram Stain (surgical/deep wound)     Status: None (Preliminary result)   Collection Time: 11/17/21  6:33 PM   Specimen:  Foot; Abscess  Result Value Ref Range Status   Specimen Description   Final    FOOT Performed at [facility], [address] [place], [place] [address]    Special Requests   Final    LEFT Performed at [facility], [address] [place], [place] [address]    Gram Stain   Final    MODERATE WBC PRESENT,BOTH PMN AND MONONUCLEAR MODERATE GRAM POSITIVE COCCI RARE GRAM NEGATIVE RODS Performed at [facility], [place] [address] [place], [place] [address]     Culture   Final    FEW STAPHYLOCOCCUS AUREUS NO ANAEROBES ISOLATED; CULTURE IN PROGRESS FOR 5 DAYS    Report Status PENDING  Incomplete   Organism ID, Bacteria STAPHYLOCOCCUS AUREUS  Final      Susceptibility   Staphylococcus aureus - MIC*    CIPROFLOXACIN >=8 RESISTANT Resistant     ERYTHROMYCIN >=8 RESISTANT Resistant     GENTAMICIN <=0.5 SENSITIVE Sensitive     OXACILLIN 0.5 SENSITIVE Sensitive     TETRACYCLINE <=1 SENSITIVE Sensitive     VANCOMYCIN 1 SENSITIVE Sensitive     TRIMETH/SULFA <=10 SENSITIVE Sensitive     CLINDAMYCIN <=0.25 SENSITIVE Sensitive     RIFAMPIN <=0.5 SENSITIVE Sensitive     Inducible Clindamycin NEGATIVE Sensitive     * FEW STAPHYLOCOCCUS AUREUS  Aerobic/Anaerobic Culture w Gram Stain (surgical/deep wound)     Status: None (Preliminary result)   Collection Time: 11/18/21  3:06 PM   Specimen: PATH Other; Tissue  Result Value Ref Range Status   Specimen Description   Final    ABSCESS Performed at [facility] [address] [place], [place] [address]    Special Requests   Final    LEFT FOOT Performed at [facility], [address] [place], [place] [address]    Gram Stain   Final    NO SQUAMOUS EPITHELIAL CELLS SEEN FEW WBC SEEN FEW GRAM POSITIVE COCCI Performed at [facility], [place] [address], [place], [place] [address]    Culture   Final    FEW STAPHYLOCOCCUS AUREUS SUSCEPTIBILITIES TO FOLLOW NO ANAEROBES ISOLATED; CULTURE IN PROGRESS FOR 5 DAYS    Report Status PENDING  Incomplete      RN Pressure Injury Documentation: Pressure Injury 11/18/21 Buttocks (Active)  11/18/21   Location: Buttocks  Location Orientation:   Staging:   Wound Description (Comments):   Present on Admission: Yes     Estimated body mass index is 20.52 kg/m as calculated from the following:   Height as of this encounter: 6' (1.829 m).   Weight as of this encounter: 68.6 kg.  Malnutrition Type: Nutrition Problem: Moderate  Malnutrition Etiology: chronic illness (Parkinson's) Malnutrition Characteristics: Signs/Symptoms: mild fat depletion, mild muscle depletion, moderate muscle depletion Nutrition Interventions: Interventions: MVI, Hormel Shake Radiology Studies: No results found.  Scheduled Meds:  amiodarone  200 mg Oral Daily   vitamin C  500 mg Oral BID   carbidopa-levodopa  1 tablet Oral QID   Chlorhexidine Gluconate Cloth  6 each Topical Daily   donepezil  10 mg Oral Daily   levothyroxine  100 mcg Oral Q0600   metoprolol tartrate  12.5 mg Oral BID   midodrine  5 mg Oral TID WC   multivitamin with minerals  1 tablet Oral Daily   simvastatin  20 mg Oral QHS   sodium chloride flush  10-40 mL Intracatheter Q12H   Vitamin D (Ergocalciferol)  50,000 Units Oral  Q7 days   zinc sulfate  220 mg Oral Daily   Continuous Infusions:   ceFAZolin (ANCEF) IV 2 g (11/20/21 1848)    LOS: 3 days   Kerney Elbe, DO [facility] PAGER is on [place]  If 7PM-7AM, please contact night-coverage [email or web address]

## 2021-11-20 NOTE — Progress Notes (Signed)
Palliative:  Palliative care has received and thanks you for this consult. We do not have palliative services on this campus over the weekend. We will plan to see patient Monday 11/23/21.   Vinie Sill, NP Palliative Medicine Team

## 2021-11-20 NOTE — TOC Progression Note (Signed)
Transition of Care (TOC) - Progression Note    Patient Details  Name: Douglas Silva. MRN: [id number] Date of Birth: [birth year]  Transition of Care [facility]) CM/SW [place], RN Phone Number: 11/20/2021, 10:17 AM  Clinical Narrative:    The patient remains impatient and continues with medical treatment, May need long term IV ABX, ID to consult, TOC to follow and assist with needs, Will go back to [facility] where he is a long term resident        Expected Discharge Plan and Services                                                 Social Determinants of Health (SDOH) Interventions    Readmission Risk Interventions No flowsheet data found.

## 2021-11-20 NOTE — Care Management Important Message (Signed)
Important Message  Patient Details  Name: Douglas Silva. MRN: [id number] Date of Birth: Mar 13, 1951   Medicare Important Message Given:  Yes     [name] 11/20/2021, 2:12 PM

## 2021-11-20 NOTE — Consult Note (Signed)
NAME: Douglas Silva.  DOB: 16-Nov-1950  MRN: [id number]  Date/Time: 11/20/2021 1:10 PM  REQUESTING PROVIDER: Dr.Sheikh Subjective:  REASON FOR CONSULT: foot infection ?No History available from patient. Chart reviewed Douglas Silva. is a 71 y.o. 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 Blood culture sent Pt was started on IV vanco and zosyn He was seen by podiatrist and underwent 5th ray excision on 11/18/21 I am asked to see patient for antibiotic management  Past Medical History:  Diagnosis Date   Atrial fibrillation ([place])    BPH (benign prostatic hyperplasia)    Cardiomyopathy ([place])    Coronary artery disease    Hypertension    Hypothyroidism    Parkinson's disease [facility])     Past Surgical History:  Procedure Laterality Date   AMPUTATION Left 11/18/2021   Procedure: LEFT FIFTH RAY AMPUTATION;  Surgeon: Samara Deist, DPM;  Location: [facility] ORS;  Service: Podiatry;  Laterality: Left;   COLONOSCOPY N/A 04/10/2015   Procedure: COLONOSCOPY;  Surgeon: Manya Silvas, MD;  Location: [facility] ENDOSCOPY;  Service: Endoscopy;  Laterality: N/A;   CORONARY ANGIOPLASTY     DIALYSIS/PERMA CATHETER INSERTION N/A 09/05/2017   Procedure: DIALYSIS/PERMA CATHETER INSERTION;  Surgeon: Algernon Huxley, MD;  Location: [place] CV LAB;  Service: Cardiovascular;  Laterality: N/A;   IRRIGATION AND DEBRIDEMENT FOOT Left 11/18/2021   Procedure: IRRIGATION AND DEBRIDEMENT FOOT;  Surgeon: Samara Deist, DPM;  Location: [facility] ORS;  Service: Podiatry;  Laterality: Left;    Social History   Socioeconomic History   Marital status: Divorced    Spouse name:  Not on file   Number of children: Not on file   Years of education: Not on file   Highest education level: Not on file  Occupational History   Not on file  Tobacco Use   Smoking status: Never   Smokeless tobacco: Never  Vaping Use   Vaping Use: Never used  Substance and Sexual Activity   Alcohol use: No   Drug use: No   Sexual activity: Not on file  Other Topics Concern   Not on file  Social History Narrative   Not on file   Social Determinants of Health   Financial Resource Strain: Not on file  Food Insecurity: Not on file  Transportation Needs: Not on file  Physical Activity: Not on file  Stress: Not on file  Social Connections: Not on file  Intimate Partner Violence: Not on file    Family History  Problem Relation Age of Onset   Lung cancer Mother    Esophageal cancer Father    Allergies  Allergen Reactions   No Known Allergies    I? Current Facility-Administered Medications  Medication Dose Route Frequency Provider Last Rate Last Admin   acetaminophen (TYLENOL) tablet 650 mg  650 mg Oral Q6H PRN Samara Deist, DPM   650 mg at 11/20/21 1227   Or   acetaminophen (TYLENOL) suppository 650 mg  650 mg Rectal Q6H PRN Samara Deist, DPM       amiodarone (PACERONE) tablet 200 mg  200 mg Oral Daily Samara Deist, DPM   200  mg at 11/20/21 1011   ascorbic acid (VITAMIN C) tablet 500 mg  500 mg Oral BID Raiford Noble Latif, DO   500 mg at 11/20/21 1011   carbidopa-levodopa (SINEMET IR) 25-100 MG per tablet immediate release 1 tablet  1 tablet Oral QID Samara Deist, DPM   1 tablet at 11/20/21 1227   ceFEPIme (MAXIPIME) 2 g in sodium chloride 0.9 % 100 mL IVPB  2 g Intravenous Q8H Samara Deist, DPM 200 mL/hr at 11/20/21 0930 2 g at 11/20/21 0930   Chlorhexidine Gluconate Cloth 2 % PADS 6 each  6 each Topical Daily Raiford Noble [place], DO   6 each at 11/20/21 1012   donepezil (ARICEPT) tablet 10 mg  10 mg Oral Daily Samara Deist, DPM   10 mg at 11/20/21 1011    levothyroxine (SYNTHROID) tablet 100 mcg  100 mcg Oral Q0600 Samara Deist, DPM   100 mcg at 11/20/21 0455   magnesium hydroxide (MILK OF MAGNESIA) suspension 30 mL  30 mL Oral Daily PRN Samara Deist, DPM       metoprolol tartrate (LOPRESSOR) tablet 12.5 mg  12.5 mg Oral BID Samara Deist, DPM   12.5 mg at 11/19/21 1027   midodrine (PROAMATINE) tablet 5 mg  5 mg Oral TID WC Samara Deist, DPM   5 mg at 11/20/21 0900   multivitamin with minerals tablet 1 tablet  1 tablet Oral Daily Raiford Noble [place], DO   1 tablet at 11/20/21 1011   ondansetron (ZOFRAN) tablet 4 mg  4 mg Oral Q6H PRN Samara Deist, DPM       Or   ondansetron [facility]) injection 4 mg  4 mg Intravenous Q6H PRN Samara Deist, DPM       potassium PHOSPHATE 30 mmol in dextrose 5 % 500 mL infusion  30 mmol Intravenous Once Raiford Noble Latif, DO 85 mL/hr at 11/20/21 1227 30 mmol at 11/20/21 1227   simvastatin (ZOCOR) tablet 20 mg  20 mg Oral QHS Samara Deist, DPM   20 mg at 11/19/21 2041   sodium chloride flush (NS) 0.9 % injection 10-40 mL  10-40 mL Intracatheter Q12H Sheikh, Georgina Quint [place], DO   10 mL at 11/20/21 1012   sodium chloride flush (NS) 0.9 % injection 10-40 mL  10-40 mL Intracatheter PRN Alfredia Ferguson, Georgina Quint Latif, DO       traZODone (DESYREL) tablet 25 mg  25 mg Oral QHS PRN Samara Deist, DPM       vancomycin (VANCOCIN) IVPB 1000 mg/200 mL premix  1,000 mg Intravenous Q12H Samara Deist, DPM 200 mL/hr at 11/20/21 0455 1,000 mg at 11/20/21 0455   Vitamin D (Ergocalciferol) (DRISDOL) capsule 50,000 Units  50,000 Units Oral Q7 days Samara Deist, DPM       zinc sulfate capsule 220 mg  220 mg Oral Daily Raiford Noble Latif, DO   220 mg at 11/20/21 1011     Abtx:  Anti-infectives (From admission, onward)    Start     Dose/Rate Route Frequency Ordered Stop   11/18/21 1400  ceFEPIme (MAXIPIME) 2 g in sodium chloride 0.9 % 100 mL IVPB        2 g 200 mL/hr over 30 Minutes Intravenous Every 8 hours 11/18/21 1001      11/18/21 0500  vancomycin (VANCOREADY) IVPB 1500 mg/300 mL  Status:  Discontinued        1,500 mg 150 mL/hr over 120 Minutes Intravenous Every 12 hours 11/17/21 1611 11/17/21 1615   11/18/21 0500  vancomycin (VANCOCIN)  IVPB 1000 mg/200 mL premix        1,000 mg 200 mL/hr over 60 Minutes Intravenous Every 12 hours 11/17/21 1615     11/17/21 1800  piperacillin-tazobactam (ZOSYN) IVPB 3.375 g  Status:  Discontinued        3.375 g 100 mL/hr over 30 Minutes Intravenous Every 6 hours 11/17/21 1559 11/17/21 1604   11/17/21 1700  vancomycin (VANCOREADY) IVPB 500 mg/100 mL        500 mg 100 mL/hr over 60 Minutes Intravenous  Once 11/17/21 1611 11/17/21 2249   11/17/21 1615  piperacillin-tazobactam (ZOSYN) IVPB 3.375 g  Status:  Discontinued        3.375 g 12.5 mL/hr over 240 Minutes Intravenous Every 8 hours 11/17/21 1606 11/18/21 1001   11/17/21 1600  vancomycin (VANCOCIN) IVPB 1000 mg/200 mL premix  Status:  Discontinued        1,000 mg 200 mL/hr over 60 Minutes Intravenous  Once 11/17/21 1557 11/17/21 1559   11/17/21 1600  ceFEPIme (MAXIPIME) 2 g in sodium chloride 0.9 % 100 mL IVPB  Status:  Discontinued        2 g 200 mL/hr over 30 Minutes Intravenous  Once 11/17/21 1557 11/17/21 1559   11/17/21 1415  ceFEPIme (MAXIPIME) 2 g in sodium chloride 0.9 % 100 mL IVPB        2 g 200 mL/hr over 30 Minutes Intravenous  Once 11/17/21 1409 11/17/21 1512   11/17/21 1415  metroNIDAZOLE (FLAGYL) IVPB 500 mg        500 mg 100 mL/hr over 60 Minutes Intravenous  Once 11/17/21 1409 11/17/21 1623   11/17/21 1415  vancomycin (VANCOCIN) IVPB 1000 mg/200 mL premix        1,000 mg 200 mL/hr over 60 Minutes Intravenous  Once 11/17/21 1409 11/17/21 1625       REVIEW OF SYSTEMS:  NA?  Objective:  VITALS:  BP (!) 141/51 (BP Location: Left Arm)    Pulse 70    Temp 97.8 F (36.6 C)    Resp 20    Ht 6' (1.829 m)    Wt 68.6 kg    SpO2 97%    BMI 20.52 kg/m  PHYSICAL EXAM:  General: somnolent, emaciated On  calling his name he opens his eyes and has uncontrolled involuntary movts of his arms and whole body. Examination is limited because of that  His speech is not comprehensible dysphonia  Head: Normocephalic, without obvious abnormality, atraumatic. Eyes: eyes are not closing properly ENTcannot examine properly Neck: head tilted to left side Back: cannot examine Lungs: b/l ai entry Heart: irrgeular Abdomen: did not examine as he had uncontrolled tremors Extremities: saw picture only Pre surgery     Post surgery     Skin: did not examine Neurologic: cannot assess But has severe hypertonia/pipe like rigidity Pertinent Labs Lab Results CBC    Component Value Date/Time   WBC 10.6 (H) 11/20/2021 0722   RBC 4.49 11/20/2021 0722   RBC 4.39 11/20/2021 0722   HGB 13.8 11/20/2021 0722   HCT 42.3 11/20/2021 0722   PLT 290 11/20/2021 0722   MCV 94.2 11/20/2021 0722   MCH 30.7 11/20/2021 0722   MCHC 32.6 11/20/2021 0722   RDW 14.9 11/20/2021 0722   LYMPHSABS 2.0 11/20/2021 0722   MONOABS 0.9 11/20/2021 0722   EOSABS 0.1 11/20/2021 0722   BASOSABS 0.1 11/20/2021 0722    CMP Latest Ref Rng & Units 11/20/2021 11/19/2021 11/18/2021  Glucose 70 - 99 mg/dL  113(H) 103(H) 101(H)  BUN 8 - 23 mg/dL '12 11 19  ' Creatinine 0.61 - 1.24 mg/dL 0.66 0.52(L) 0.61  Sodium 135 - 145 mmol/L 139 138 137  Potassium 3.5 - 5.1 mmol/L 3.8 3.4(L) 3.3(L)  Chloride 98 - 111 mmol/L 103 103 103  CO2 22 - 32 mmol/L '30 29 28  ' Calcium 8.9 - 10.3 mg/dL 8.5(L) 8.6(L) 8.2(L)  Total Protein 6.5 - 8.1 g/dL 6.3(L) 5.8(L) 5.3(L)  Total Bilirubin 0.3 - 1.2 mg/dL 0.8 1.0 0.8  Alkaline Phos 38 - 126 U/L 72 61 57  AST 15 - 41 U/L 149(H) 170(H) 135(H)  ALT 0 - 44 U/L 73(H) 129(H) 106(H)      Microbiology: Recent Results (from the past 240 hour(s))  Blood Culture (routine x 2)     Status: None (Preliminary result)   Collection Time: 11/17/21  2:06 PM   Specimen: BLOOD  Result Value Ref Range Status   Specimen  Description BLOOD PICC LINE  Final   Special Requests BOTTLES DRAWN AEROBIC AND ANAEROBIC BCAV  Final   Culture   Final    NO GROWTH 3 DAYS Performed at [facility], [address], [place], [place] [address]    Report Status PENDING  Incomplete  Resp Panel by RT-PCR (Flu A&B, Covid) Nasopharyngeal Swab     Status: None   Collection Time: 11/17/21  2:42 PM   Specimen: Nasopharyngeal Swab; Nasopharyngeal(NP) swabs in vial transport medium  Result Value Ref Range Status   SARS Coronavirus 2 by RT PCR NEGATIVE NEGATIVE Final    Comment: (NOTE) SARS-CoV-2 target nucleic acids are NOT DETECTED.  The SARS-CoV-2 RNA is generally detectable in upper respiratory specimens during the acute phase of infection. The lowest concentration of SARS-CoV-2 viral copies this assay can detect is 138 copies/mL. A negative result does not preclude SARS-Cov-2 infection and should not be used as the sole basis for treatment or other patient management decisions. A negative result may occur with  improper specimen collection/handling, submission of specimen other than nasopharyngeal swab, presence of viral mutation(s) within the areas targeted by this assay, and inadequate number of viral copies(<138 copies/mL). A negative result must be combined with clinical observations, patient history, and epidemiological information. The expected result is Negative.  Fact Sheet for Patients:  EntrepreneurPulse.com.au  Fact Sheet for Healthcare Providers:  IncredibleEmployment.be  This test is no t yet approved or cleared by the [place] FDA and  has been authorized for detection and/or diagnosis of SARS-CoV-2 by FDA under an Emergency Use Authorization (EUA). This EUA will remain  in effect (meaning this test can be used) for the duration of the COVID-19 declaration under Section 564(b)(1) of the Act, 21 U.S.C.section 360bbb-3(b)(1), unless the authorization is  terminated  or revoked sooner.       Influenza A by PCR NEGATIVE NEGATIVE Final   Influenza B by PCR NEGATIVE NEGATIVE Final    Comment: (NOTE) The Xpert Xpress SARS-CoV-2/FLU/RSV plus assay is intended as an aid in the diagnosis of influenza from Nasopharyngeal swab specimens and should not be used as a sole basis for treatment. Nasal washings and aspirates are unacceptable for Xpert Xpress SARS-CoV-2/FLU/RSV testing.  Fact Sheet for Patients: EntrepreneurPulse.com.au  Fact Sheet for Healthcare Providers: IncredibleEmployment.be  This test is not yet approved or cleared by the [place] FDA and has been authorized for detection and/or diagnosis of SARS-CoV-2 by FDA under an Emergency Use Authorization (EUA). This EUA will remain in effect (meaning this test can be  used) for the duration of the COVID-19 declaration under Section 564(b)(1) of the Act, 21 U.S.C. section 360bbb-3(b)(1), unless the authorization is terminated or revoked.  Performed at [facility], [address] [place], [place] [address]   Blood Culture (routine x 2)     Status: None (Preliminary result)   Collection Time: 11/17/21  2:42 PM   Specimen: BLOOD  Result Value Ref Range Status   Specimen Description BLOOD ARM  Final   Special Requests BOTTLES DRAWN AEROBIC AND ANAEROBIC BCAV  Final   Culture   Final    NO GROWTH 3 DAYS Performed at [facility], [address] [place], [place] [address]    Report Status PENDING  Incomplete  Aerobic/Anaerobic Culture w Gram Stain (surgical/deep wound)     Status: None (Preliminary result)   Collection Time: 11/17/21  6:33 PM   Specimen: Foot; Abscess  Result Value Ref Range Status   Specimen Description   Final    FOOT Performed at [facility], [address] [place], [place] [address]    Special Requests   Final    LEFT Performed at [facility], [address]  [place], [place] [address]    Gram Stain   Final    MODERATE WBC PRESENT,BOTH PMN AND MONONUCLEAR MODERATE GRAM POSITIVE COCCI RARE GRAM NEGATIVE RODS Performed at [facility], [place] [address], [place], [place] [address]    Culture   Final    FEW STAPHYLOCOCCUS AUREUS NO ANAEROBES ISOLATED; CULTURE IN PROGRESS FOR 5 DAYS    Report Status PENDING  Incomplete   Organism ID, Bacteria STAPHYLOCOCCUS AUREUS  Final      Susceptibility   Staphylococcus aureus - MIC*    CIPROFLOXACIN >=8 RESISTANT Resistant     ERYTHROMYCIN >=8 RESISTANT Resistant     GENTAMICIN <=0.5 SENSITIVE Sensitive     OXACILLIN 0.5 SENSITIVE Sensitive     TETRACYCLINE <=1 SENSITIVE Sensitive     VANCOMYCIN 1 SENSITIVE Sensitive     TRIMETH/SULFA <=10 SENSITIVE Sensitive     CLINDAMYCIN <=0.25 SENSITIVE Sensitive     RIFAMPIN <=0.5 SENSITIVE Sensitive     Inducible Clindamycin NEGATIVE Sensitive     * FEW STAPHYLOCOCCUS AUREUS  Aerobic/Anaerobic Culture w Gram Stain (surgical/deep wound)     Status: None (Preliminary result)   Collection Time: 11/18/21  3:06 PM   Specimen: PATH Other; Tissue  Result Value Ref Range Status   Specimen Description   Final    ABSCESS Performed at [facility], [address], [place], [place] [address]    Special Requests   Final    LEFT FOOT Performed at [facility], [address] [place], [place] [address]    Gram Stain   Final    NO SQUAMOUS EPITHELIAL CELLS SEEN FEW WBC SEEN FEW GRAM POSITIVE COCCI Performed at [facility], [place] [address] [place],  [address]    Culture   Final    FEW STAPHYLOCOCCUS AUREUS SUSCEPTIBILITIES TO FOLLOW NO ANAEROBES ISOLATED; CULTURE IN PROGRESS FOR 5 DAYS    Report Status PENDING  Incomplete    IMAGING RESULTS: Soft tissue swelling and subcutaneous lucencies identified at the lateral aspect of the foot centered near the distal fifth metatarsal, consistent with soft tissue wound/infection.    Destructive appearance of the fifth metatarsal head and base of the fifth proximal phalanx, with associated pathologic fracture at the metatarsal head.  I have personally reviewed the films ? Impression/Recommendation  Left  foot infection -ulcer lateral aspect with ostemyelitis of the 5 th toe- s/p 5th ray amputation- MSSA in cultures- will change vanco/cefepime to cefazolin Will decide on duration and route once we have pathology and also assessing the wound with podiatrist  ?Parkinsons disease with severe lead pipe rigidity and also severe involuntary tremors when touched or turned. On sinemet  Autonomic dysfunction  Severe dysphonia  Afib on amiodarone  Sacral decubitus reported - not examined  Palliative on board_ _______________________________________________ Discussed with his nurse and requesting provider ID will follow him peripherally this weekend- call if needed  Note:  This document was prepared using Dragon voice recognition software and may include unintentional dictation errors.

## 2021-11-20 NOTE — Progress Notes (Signed)
2 Days Post-Op   Subjective/Chief Complaint: Patient seen.  States that his pain has significantly improved as far as prior to surgery.   Objective: Vital signs in last 24 hours: Temp:  [97.8 F (36.6 C)-98.7 F (37.1 C)] 97.9 F (36.6 C) (01/27 1613) Pulse Rate:  [53-77] 77 (01/27 1613) Resp:  [15-20] 18 (01/27 1613) BP: (92-145)/(37-66) 145/62 (01/27 1613) SpO2:  [97 %-100 %] 98 % (01/27 1613) Last BM Date: 11/20/21  Intake/Output from previous day: 01/26 0701 - 01/27 0700 In: 1243.9 [P.O.:240; IV Piggyback:1003.9] Out: 2000 [Urine:2000] Intake/Output this shift: Total I/O In: 494.7 [IV Piggyback:494.7] Out: 850 [Urine:850]  Moderate drainage is noted on the bandaging.  Upon removal some mild dehiscence at the proximal aspect of the incision with some expressible brownish purulent drainage.  Some edema and erythema still noted along the incision line area.    Lab Results:  Recent Labs    11/19/21 1045 11/20/21 0722  WBC 12.9* 10.6*  HGB 12.6* 13.8  HCT 37.9* 42.3  PLT 316 290   BMET Recent Labs    11/19/21 1045 11/20/21 0722  NA 138 139  K 3.4* 3.8  CL 103 103  CO2 29 30  GLUCOSE 103* 113*  BUN 11 12  CREATININE 0.52* 0.66  CALCIUM 8.6* 8.5*   PT/INR No results for input(s): LABPROT, INR in the last 72 hours. ABG No results for input(s): PHART, HCO3 in the last 72 hours.  Invalid input(s): PCO2, PO2  Studies/Results: No results found.  Anti-infectives: Anti-infectives (From admission, onward)    Start     Dose/Rate Route Frequency Ordered Stop   11/20/21 1800  ceFAZolin (ANCEF) IVPB 2g/100 mL premix        2 g 200 mL/hr over 30 Minutes Intravenous Every 8 hours 11/20/21 1547     11/18/21 1400  ceFEPIme (MAXIPIME) 2 g in sodium chloride 0.9 % 100 mL IVPB  Status:  Discontinued        2 g 200 mL/hr over 30 Minutes Intravenous Every 8 hours 11/18/21 1001 11/20/21 1547   11/18/21 0500  vancomycin (VANCOREADY) IVPB 1500 mg/300 mL  Status:   Discontinued        1,500 mg 150 mL/hr over 120 Minutes Intravenous Every 12 hours 11/17/21 1611 11/17/21 1615   11/18/21 0500  vancomycin (VANCOCIN) IVPB 1000 mg/200 mL premix  Status:  Discontinued        1,000 mg 200 mL/hr over 60 Minutes Intravenous Every 12 hours 11/17/21 1615 11/20/21 1547   11/17/21 1800  piperacillin-tazobactam (ZOSYN) IVPB 3.375 g  Status:  Discontinued        3.375 g 100 mL/hr over 30 Minutes Intravenous Every 6 hours 11/17/21 1559 11/17/21 1604   11/17/21 1700  vancomycin (VANCOREADY) IVPB 500 mg/100 mL        500 mg 100 mL/hr over 60 Minutes Intravenous  Once 11/17/21 1611 11/17/21 2249   11/17/21 1615  piperacillin-tazobactam (ZOSYN) IVPB 3.375 g  Status:  Discontinued        3.375 g 12.5 mL/hr over 240 Minutes Intravenous Every 8 hours 11/17/21 1606 11/18/21 1001   11/17/21 1600  vancomycin (VANCOCIN) IVPB 1000 mg/200 mL premix  Status:  Discontinued        1,000 mg 200 mL/hr over 60 Minutes Intravenous  Once 11/17/21 1557 11/17/21 1559   11/17/21 1600  ceFEPIme (MAXIPIME) 2 g in sodium chloride 0.9 % 100 mL IVPB  Status:  Discontinued        2 g  200 mL/hr over 30 Minutes Intravenous  Once 11/17/21 1557 11/17/21 1559   11/17/21 1415  ceFEPIme (MAXIPIME) 2 g in sodium chloride 0.9 % 100 mL IVPB        2 g 200 mL/hr over 30 Minutes Intravenous  Once 11/17/21 1409 11/17/21 1512   11/17/21 1415  metroNIDAZOLE (FLAGYL) IVPB 500 mg        500 mg 100 mL/hr over 60 Minutes Intravenous  Once 11/17/21 1409 11/17/21 1623   11/17/21 1415  vancomycin (VANCOCIN) IVPB 1000 mg/200 mL premix        1,000 mg 200 mL/hr over 60 Minutes Intravenous  Once 11/17/21 1409 11/17/21 1625       Assessment/Plan: s/p Procedure(s): LEFT FIFTH RAY AMPUTATION (Left) IRRIGATION AND DEBRIDEMENT FOOT (Left) Assessment: Status post I&D with amputation fifth ray.  Plan: Betadine and a sterile dressing reapplied to the left foot.  Discussed with the patient that we may need to reI&D  the foot and flush out any remaining infection.  Reassess the wound tomorrow and if no improvement most likely plan for I&D on Sunday.  LOS: 3 days    [name] 11/20/2021

## 2021-11-21 DIAGNOSIS — F02818 Dementia in other diseases classified elsewhere, unspecified severity, with other behavioral disturbance: Secondary | ICD-10-CM | POA: Diagnosis not present

## 2021-11-21 DIAGNOSIS — E440 Moderate protein-calorie malnutrition: Secondary | ICD-10-CM | POA: Diagnosis not present

## 2021-11-21 DIAGNOSIS — M86071 Acute hematogenous osteomyelitis, right ankle and foot: Secondary | ICD-10-CM | POA: Diagnosis not present

## 2021-11-21 DIAGNOSIS — G20 Parkinson's disease: Secondary | ICD-10-CM | POA: Diagnosis not present

## 2021-11-21 LAB — CBC WITH DIFFERENTIAL/PLATELET
Abs Immature Granulocytes: 0.04 10*3/uL (ref 0.00–0.07)
Basophils Absolute: 0 10*3/uL (ref 0.0–0.1)
Basophils Relative: 0 %
Eosinophils Absolute: 0.1 10*3/uL (ref 0.0–0.5)
Eosinophils Relative: 1 %
HCT: 37.7 % — ABNORMAL LOW (ref 39.0–52.0)
Hemoglobin: 12.6 g/dL — ABNORMAL LOW (ref 13.0–17.0)
Immature Granulocytes: 0 %
Lymphocytes Relative: 17 %
Lymphs Abs: 1.8 10*3/uL (ref 0.7–4.0)
MCH: 31.7 pg (ref 26.0–34.0)
MCHC: 33.4 g/dL (ref 30.0–36.0)
MCV: 94.7 fL (ref 80.0–100.0)
Monocytes Absolute: 1 10*3/uL (ref 0.1–1.0)
Monocytes Relative: 10 %
Neutro Abs: 7.6 10*3/uL (ref 1.7–7.7)
Neutrophils Relative %: 72 %
Platelets: 244 10*3/uL (ref 150–400)
RBC: 3.98 MIL/uL — ABNORMAL LOW (ref 4.22–5.81)
RDW: 14.9 % (ref 11.5–15.5)
WBC: 10.6 10*3/uL — ABNORMAL HIGH (ref 4.0–10.5)
nRBC: 0 % (ref 0.0–0.2)

## 2021-11-21 LAB — COMPREHENSIVE METABOLIC PANEL
ALT: 35 U/L (ref 0–44)
AST: 106 U/L — ABNORMAL HIGH (ref 15–41)
Albumin: 2.5 g/dL — ABNORMAL LOW (ref 3.5–5.0)
Alkaline Phosphatase: 66 U/L (ref 38–126)
Anion gap: 7 (ref 5–15)
BUN: 10 mg/dL (ref 8–23)
CO2: 26 mmol/L (ref 22–32)
Calcium: 8.5 mg/dL — ABNORMAL LOW (ref 8.9–10.3)
Chloride: 104 mmol/L (ref 98–111)
Creatinine, Ser: 0.45 mg/dL — ABNORMAL LOW (ref 0.61–1.24)
GFR, Estimated: >60 mL/min (ref >60)
Glucose, Bld: 82 mg/dL (ref 70–99)
Potassium: 4.3 mmol/L (ref 3.5–5.1)
Sodium: 137 mmol/L (ref 135–145)
Total Bilirubin: 0.6 mg/dL (ref 0.3–1.2)
Total Protein: 5.8 g/dL — ABNORMAL LOW (ref 6.5–8.1)

## 2021-11-21 LAB — APTT: aPTT: 31 seconds (ref 24–36)

## 2021-11-21 LAB — PHOSPHORUS: Phosphorus: 2.3 mg/dL — ABNORMAL LOW (ref 2.5–4.6)

## 2021-11-21 LAB — HEPARIN LEVEL (UNFRACTIONATED): Heparin Unfractionated: 0.35 IU/mL (ref 0.30–0.70)

## 2021-11-21 LAB — TSH: TSH: 4.105 u[IU]/mL (ref 0.350–4.500)

## 2021-11-21 LAB — MAGNESIUM: Magnesium: 2.1 mg/dL (ref 1.7–2.4)

## 2021-11-21 MED ORDER — K PHOS MONO-SOD PHOS DI & MONO 155-852-130 MG PO TABS
500.0000 mg | ORAL_TABLET | Freq: Once | ORAL | Status: AC
  Administered 2021-11-21: 500 mg via ORAL
  Filled 2021-11-21: qty 2

## 2021-11-21 MED ORDER — HEPARIN (PORCINE) 25000 UT/250ML-% IV SOLN
1100.0000 [IU]/h | INTRAVENOUS | Status: AC
  Administered 2021-11-21: 1100 [IU]/h via INTRAVENOUS
  Filled 2021-11-21: qty 250

## 2021-11-21 MED ORDER — SODIUM CHLORIDE 0.9 % IV SOLN: INTRAVENOUS | Status: DC | PRN

## 2021-11-21 MED ORDER — HEPARIN BOLUS VIA INFUSION
3500.0000 [IU] | Freq: Once | INTRAVENOUS | Status: AC
  Administered 2021-11-21: 3500 [IU] via INTRAVENOUS
  Filled 2021-11-21: qty 3500

## 2021-11-21 NOTE — Progress Notes (Signed)
PROGRESS NOTE    Douglas Silva.  UVO:[id number] DOB: [birth year] DOA: 11/17/2021 PCP: Remi Haggard, FNP   Brief Narrative:  The patient is a 71 year old thin chronically ill-appearing Caucasian male with a past medical history significant for but not limited to atrial fibrillation, BPH, CAD, hypothyroidism, history of cardiomyopathy, history of Parkinson's disease as well as other comorbidities who presented to [facility] with left foot worsening despite chronic antibiotics at the facility with inability to take care of the wound.  He had no reported chills or fevers and no nausea or vomiting.  When he came to the ED blood pressure 97/77 temperature 97.2.  Labs were reviewed and he had a hypokalemia and elevated LFTs.  CBC showed an elevated WBC of 10.7 with anemia.  Imaging of the left foot showed soft tissue infection and wound of the lateral aspect of the foot with underlying acute osteomyelitis involving the distal fifth metatarsal at the fifth proximal phalanx.  He was initially given IV cefepime, IV vancomycin and Flagyl and admitted to a medical bed for further management and podiatry was consulted.  Podiatry felt the patient would benefit from amputation and took the patient to the OR on 11/18/2021 for Fifth ray amputation with deep wound culture given his osteomyelitis.  ID has been consulted for further antibiotic management as he has MSSA in his culture and given changes Banken cefepime to cefazolin.  They will Decide on the duration and route once we have the pathology and also assess the wound with the podiatrist.  Palliative care has been consulted for further goals of care discussion and will see the patient on Monday, 11/23/2021.  Will await further cultures for ID to evaluate and make recommendations.  Podiatry is considering a repeat I&D of the foot to flush out any remaining infection possibly tomorrow but they will reevaluate the foot today.  Assessment &  Plan:   Principal Problem:   Acute hematogenous osteomyelitis of right foot (HCC) Active Problems:   Malnutrition of moderate degree  Acute osteomyelitis of the left foot with abscess status post left fifth ray amputation and deep wound culture -The patient was admitted to a medical bed. -We will continue antibiotic therapy with IV vancomycin but change IV Zosyn to IV cefepime.  We will continue his antibiotics for now and consult ID for further assistance with antibiotics upon discharge -Podiatry was consulted and Dr. Vickki Muff evaluated and he felt that the patient will need an I&D and removal of all infected tissue and felt that this was likely need to be left open and packed; he also felt that this may lead to a second primary delayed primary closure down the road -He has been n.p.o. after midnight for surgical intervention later today -IV fluid hydration. -Patient's WBC went from 10.7 is now 11.6 yesterday and has worsened to 12.9 yesterday but is now improved to 10.6 x2 -Continue with IV antibiotics as above and continue monitor and follow-up on podiatry recommendations and podiatry recommends closely monitoring to make sure the erythema is improving; no Shadrack purulence was noted today but Dr. Vickki Muff did note a large amount of Francois purulent drainage in the operation -Podiatry recommends ID assistance so we will consult Dr. Delaine Lame and she is changed to Vanco cefepime to IV cefazolin for now and will decide on duration and route once the pathology as well as the assessment of the wound with a podiatrist  -Podiatry evaluated and he had some moderate drainage with some slight dehiscence  at the proximal aspect of the incision with brownish purulent drainage.  There are recommending adding Betadine and a sterile dressing and discussed with the made need to reI&D the foot/other remaining infection and they can reevaluate the wound tomorrow if no improvement likely plan for an I&D on Sunday -Repeat  Podiatry evaluation of the wound pending today  Paroxysmal atrial fibrillation currently rate controlled ventricular response. -C/w Amiodarone 200 mg po Daily -Hold AC given Surgical Intervention and will resume once okayed by a podiatry standpoint and will still hold given that he may undergo repeat I&D on Sunday; will change to heparin drip for now and continue to hold Oral AC  Hypokalemia -Patient's potassium was 4.3 -Continue to monitor and replete as necessary -Repeat CMP in a.m.  Hypophosphatemia -Patient's Phos level was 2.0 and is now 2.3 -Replete with IV K-Phos 30 mmol and po K Phos 500 mg x1 -Continue to monitor and replete as necessary -Repeat phosphorus level in a.m.  Abnormal LFTs -Slightly worsening initially but now improving  -Patient's AST peaked to 170 and is now improving to 106 -ALT peaked at 129 and is now improving to 35 -We will stop his IV fluid hydration given concern of passive hepatic congestion -Continue to monitor and trend hepatic function carefully and if necessary will obtain a right upper quadrant ultrasound as well as an acute hepatitis panel in the morning -Repeat CMP in a.m.  Hypothyroidism. -Continue Levothyroxine. -Checked TSH 4.105  Dementia and Parkinson's disease. -Continue home Sinemet IR 25-100 mg 1 tab p.o. 4 times daily as well as donepezil 10 mg p.o. daily -Has significant atrophy and muscle wasting in the setting of his Parkinson's disease -He is much more confused yesterday but is now improving and calmer -We will consult palliative care for goals of care discussion and this is to happen on Monday, 11/23/2020  Coronary artery disease. -Continue metoprolol tartrate 12.5 mg p.o. twice daily as well as simvastatin 20 mg p.o. nightly  Dyslipidemia. -Continue simvastatin 20 mg p.o. nightly  Normocytic Anemia -Patient's hemoglobin/hematocrit went from 12.8/39.3 -> 12.1/37.0 -> 12.6/37.9 -> 13.8/42.3 -> 12.6/37.7 -Anemia panel was  checked and showed an iron level of 57, U IBC 163, TIBC 220, saturation ratios of 26%, ferritin level of 510, folate level of 9.5, vitamin B12 548 -Continue to monitor for signs and symptoms bleeding; no overt bleeding noted -Repeat CBC in the a.m.  Moderate nonsevere malnutrition in the context of chronic illness -Nutritionist consulted for further evaluation recommendations -Estimated body mass index is 20.52 kg/m as calculated from the following:   Height as of this encounter: 6' (1.829 m).   Weight as of this encounter: 68.6 kg. -Discharged recommending mighty shakes 3 times daily with meals as well as multivitamin with minerals daily as well as 500 mg of vitamin C twice daily and to 20 mg of zinc sulfate daily for 14 days and feeding assistance with meals  DVT prophylaxis: SCDs and will resume his anticoagulation once okay from a podiatry perspective and will continue to hold for now given that he may undergo repeat I&D Code Status: FULL CODE Family Communication: No family present at bedside  Disposition Plan: Pending further clinical improvement and clearance by podiatry  Status is: Inpatient  Remains inpatient appropriate because: He will be undergoing surgical intervention today by podiatry  Consultants:  Podiatry  Infectious diseases Palliative care  Procedures: Procedure: Left fifth ray amputation done by Dr. Samara Deist  Antimicrobials:  Anti-infectives (From admission, onward)  Start     Dose/Rate Route Frequency Ordered Stop   11/20/21 1800  ceFAZolin (ANCEF) IVPB 2g/100 mL premix        2 g 200 mL/hr over 30 Minutes Intravenous Every 8 hours 11/20/21 1547     11/18/21 1400  ceFEPIme (MAXIPIME) 2 g in sodium chloride 0.9 % 100 mL IVPB  Status:  Discontinued        2 g 200 mL/hr over 30 Minutes Intravenous Every 8 hours 11/18/21 1001 11/20/21 1547   11/18/21 0500  vancomycin (VANCOREADY) IVPB 1500 mg/300 mL  Status:  Discontinued        1,500 mg 150 mL/hr over  120 Minutes Intravenous Every 12 hours 11/17/21 1611 11/17/21 1615   11/18/21 0500  vancomycin (VANCOCIN) IVPB 1000 mg/200 mL premix  Status:  Discontinued        1,000 mg 200 mL/hr over 60 Minutes Intravenous Every 12 hours 11/17/21 1615 11/20/21 1547   11/17/21 1800  piperacillin-tazobactam (ZOSYN) IVPB 3.375 g  Status:  Discontinued        3.375 g 100 mL/hr over 30 Minutes Intravenous Every 6 hours 11/17/21 1559 11/17/21 1604   11/17/21 1700  vancomycin (VANCOREADY) IVPB 500 mg/100 mL        500 mg 100 mL/hr over 60 Minutes Intravenous  Once 11/17/21 1611 11/17/21 2249   11/17/21 1615  piperacillin-tazobactam (ZOSYN) IVPB 3.375 g  Status:  Discontinued        3.375 g 12.5 mL/hr over 240 Minutes Intravenous Every 8 hours 11/17/21 1606 11/18/21 1001   11/17/21 1600  vancomycin (VANCOCIN) IVPB 1000 mg/200 mL premix  Status:  Discontinued        1,000 mg 200 mL/hr over 60 Minutes Intravenous  Once 11/17/21 1557 11/17/21 1559   11/17/21 1600  ceFEPIme (MAXIPIME) 2 g in sodium chloride 0.9 % 100 mL IVPB  Status:  Discontinued        2 g 200 mL/hr over 30 Minutes Intravenous  Once 11/17/21 1557 11/17/21 1559   11/17/21 1415  ceFEPIme (MAXIPIME) 2 g in sodium chloride 0.9 % 100 mL IVPB        2 g 200 mL/hr over 30 Minutes Intravenous  Once 11/17/21 1409 11/17/21 1512   11/17/21 1415  metroNIDAZOLE (FLAGYL) IVPB 500 mg        500 mg 100 mL/hr over 60 Minutes Intravenous  Once 11/17/21 1409 11/17/21 1623   11/17/21 1415  vancomycin (VANCOCIN) IVPB 1000 mg/200 mL premix        1,000 mg 200 mL/hr over 60 Minutes Intravenous  Once 11/17/21 1409 11/17/21 1625        Subjective: Seen and examined at bedside and he was calmer today and not as confused.  He is answering questions appropriately.  He is not having any significant tremors today.  No nausea or vomiting.  Denies any pain and thinks his foot is doing little bit better.  No other concerns or complaints at this  time.  Objective: Vitals:   11/20/21 2315 11/21/21 0443 11/21/21 0820 11/21/21 1155  BP: 103/71 93/64 92/61  93/61  Pulse: 62 64 (!) 59 63  Resp: 16 16 18 15   Temp:  99.2 F (37.3 C) 98.6 F (37 C) 98.5 F (36.9 C)  TempSrc:      SpO2: 100% 100% 97% 97%  Weight:      Height:        Intake/Output Summary (Last 24 hours) at 11/21/2021 1522 Last data filed at 11/21/2021 1333  Gross per 24 hour  Intake 360 ml  Output 2025 ml  Net -1665 ml    Filed Weights   11/17/21 1354  Weight: 68.6 kg   Examination: Physical Exam:  Constitutional: Thin chronically ill-appearing Caucasian male currently who is significant contracted and has some atrophy appears calmer today and not as confused Eyes:  Lids and conjunctivae normal, sclerae anicteric  ENMT: External Ears, Nose appear normal. Grossly normal hearing. Mucous membranes are moist Neck: Appears normal, supple, no cervical masses, normal ROM, no appreciable thyromegaly; no appreciable JVD Respiratory: Diminished to auscultation bilaterally with coarse breath sounds, no wheezing, rales, rhonchi or crackles. Normal respiratory effort and patient is not tachypenic. No accessory muscle use.  Unlabored breathing Cardiovascular: RRR, no murmurs / rubs / gallops. S1 and S2 auscultated.  Abdomen: Soft, non-tender, non-distended. Bowel sounds positive.  GU: Deferred. Musculoskeletal: No clubbing / cyanosis of digits/nails.  Has some contractures and atrophy and he has a right fifth toe ray amputation Skin: Right foot is bandaged Neurologic: He appears calmer today and denies tremors.  Continues to have significant muscle contractures and atrophy Psychiatric: Normal judgment and insight. Alert and oriented x 2. Normal mood and appropriate affect and he is not agitated.   Data Reviewed: I have personally reviewed following labs and imaging studies  CBC: Recent Labs  Lab 11/17/21 1406 11/18/21 0736 11/19/21 1045 11/20/21 0722 11/21/21 1002   WBC 10.7* 11.6* 12.9* 10.6* 10.6*  NEUTROABS 7.9*  --  9.4* 7.4 7.6  HGB 12.8* 12.1* 12.6* 13.8 12.6*  HCT 39.3 37.0* 37.9* 42.3 37.7*  MCV 95.6 93.2 92.0 94.2 94.7  PLT 300 300 316 290 053    Basic Metabolic Panel: Recent Labs  Lab 11/17/21 1406 11/18/21 0500 11/18/21 0736 11/19/21 1045 11/20/21 0722 11/21/21 0824  NA 137  --  137 138 139 137  K 2.9*  --  3.3* 3.4* 3.8 4.3  CL 103  --  103 103 103 104  CO2 29  --  28 29 30 26   GLUCOSE 125*  --  101* 103* 113* 82  BUN 18  --  19 11 12 10   CREATININE 0.51*  --  0.61 0.52* 0.66 0.45*  CALCIUM 8.3*  --  8.2* 8.6* 8.5* 8.5*  MG  --  1.9  --  1.7 2.2 2.1  PHOS  --  2.2*  --  1.9* 2.0* 2.3*    GFR: Estimated Creatinine Clearance: 84.6 mL/min (A) (by C-G formula based on SCr of 0.45 mg/dL (L)). Liver Function Tests: Recent Labs  Lab 11/17/21 1406 11/18/21 0500 11/19/21 1045 11/20/21 0722 11/21/21 0824  AST 158* 135* 170* 149* 106*  ALT 60* 106* 129* 73* 35  ALKPHOS 65 57 61 72 66  BILITOT 0.7 0.8 1.0 0.8 0.6  PROT 5.7* 5.3* 5.8* 6.3* 5.8*  ALBUMIN 2.5* 2.3* 2.5* 2.7* 2.5*    No results for input(s): LIPASE, AMYLASE in the last 168 hours. No results for input(s): AMMONIA in the last 168 hours. Coagulation Profile: Recent Labs  Lab 11/17/21 1406  INR 1.5*    Cardiac Enzymes: No results for input(s): CKTOTAL, CKMB, CKMBINDEX, TROPONINI in the last 168 hours. BNP (last 3 results) No results for input(s): PROBNP in the last 8760 hours. HbA1C: No results for input(s): HGBA1C in the last 72 hours. CBG: Recent Labs  Lab 11/18/21 1849 11/18/21 2013  GLUCAP 86 96    Lipid Profile: No results for input(s): CHOL, HDL, LDLCALC, TRIG, CHOLHDL, LDLDIRECT in the last 72  hours. Thyroid Function Tests: Recent Labs    11/21/21 0824  TSH 4.105   Anemia Panel: Recent Labs    11/20/21 0722  VITAMINB12 548  FOLATE 9.5  FERRITIN 510*  TIBC 220*  IRON 57  RETICCTPCT 1.7    Sepsis Labs: Recent Labs  Lab  11/17/21 1406  LATICACIDVEN 1.3     Recent Results (from the past 240 hour(s))  Blood Culture (routine x 2)     Status: None (Preliminary result)   Collection Time: 11/17/21  2:06 PM   Specimen: BLOOD  Result Value Ref Range Status   Specimen Description BLOOD PICC LINE  Final   Special Requests BOTTLES DRAWN AEROBIC AND ANAEROBIC BCAV  Final   Culture   Final    NO GROWTH 4 DAYS Performed at [facility], [address], [place], [place] [address]    Report Status PENDING  Incomplete  Resp Panel by RT-PCR (Flu A&B, Covid) Nasopharyngeal Swab     Status: None   Collection Time: 11/17/21  2:42 PM   Specimen: Nasopharyngeal Swab; Nasopharyngeal(NP) swabs in vial transport medium  Result Value Ref Range Status   SARS Coronavirus 2 by RT PCR NEGATIVE NEGATIVE Final    Comment: (NOTE) SARS-CoV-2 target nucleic acids are NOT DETECTED.  The SARS-CoV-2 RNA is generally detectable in upper respiratory specimens during the acute phase of infection. The lowest concentration of SARS-CoV-2 viral copies this assay can detect is 138 copies/mL. A negative result does not preclude SARS-Cov-2 infection and should not be used as the sole basis for treatment or other patient management decisions. A negative result may occur with  improper specimen collection/handling, submission of specimen other than nasopharyngeal swab, presence of viral mutation(s) within the areas targeted by this assay, and inadequate number of viral copies(<138 copies/mL). A negative result must be combined with clinical observations, patient history, and epidemiological information. The expected result is Negative.  Fact Sheet for Patients:  EntrepreneurPulse.com.au  Fact Sheet for Healthcare Providers:  IncredibleEmployment.be  This test is no t yet approved or cleared by the [place] FDA and  has been authorized for detection and/or diagnosis of SARS-CoV-2 by FDA  under an Emergency Use Authorization (EUA). This EUA will remain  in effect (meaning this test can be used) for the duration of the COVID-19 declaration under Section 564(b)(1) of the Act, 21 U.S.C.section 360bbb-3(b)(1), unless the authorization is terminated  or revoked sooner.       Influenza A by PCR NEGATIVE NEGATIVE Final   Influenza B by PCR NEGATIVE NEGATIVE Final    Comment: (NOTE) The Xpert Xpress SARS-CoV-2/FLU/RSV plus assay is intended as an aid in the diagnosis of influenza from Nasopharyngeal swab specimens and should not be used as a sole basis for treatment. Nasal washings and aspirates are unacceptable for Xpert Xpress SARS-CoV-2/FLU/RSV testing.  Fact Sheet for Patients: EntrepreneurPulse.com.au  Fact Sheet for Healthcare Providers: IncredibleEmployment.be  This test is not yet approved or cleared by the [place] FDA and has been authorized for detection and/or diagnosis of SARS-CoV-2 by FDA under an Emergency Use Authorization (EUA). This EUA will remain in effect (meaning this test can be used) for the duration of the COVID-19 declaration under Section 564(b)(1) of the Act, 21 U.S.C. section 360bbb-3(b)(1), unless the authorization is terminated or revoked.  Performed at [facility], [address] [place], [place] [address]   Blood Culture (routine x 2)     Status: None (Preliminary result)   Collection Time: 11/17/21  2:42 PM   Specimen: BLOOD  Result Value Ref Range Status   Specimen Description BLOOD ARM  Final   Special Requests BOTTLES DRAWN AEROBIC AND ANAEROBIC BCAV  Final   Culture   Final    NO GROWTH 4 DAYS Performed at [facility], [address] [place], [place] [address]    Report Status PENDING  Incomplete  Aerobic/Anaerobic Culture w Gram Stain (surgical/deep wound)     Status: None (Preliminary result)   Collection Time: 11/17/21  6:33 PM   Specimen: Foot; Abscess   Result Value Ref Range Status   Specimen Description   Final    FOOT Performed at [facility], [address] [place], [place] [address]    Special Requests   Final    LEFT Performed at [facility], [address] [place], [place] [address]    Gram Stain   Final    MODERATE WBC PRESENT,BOTH PMN AND MONONUCLEAR MODERATE GRAM POSITIVE COCCI RARE GRAM NEGATIVE RODS Performed at [facility], [place] [address], [place], [place] [address]    Culture   Final    FEW STAPHYLOCOCCUS AUREUS NO ANAEROBES ISOLATED; CULTURE IN PROGRESS FOR 5 DAYS    Report Status PENDING  Incomplete   Organism ID, Bacteria STAPHYLOCOCCUS AUREUS  Final      Susceptibility   Staphylococcus aureus - MIC*    CIPROFLOXACIN >=8 RESISTANT Resistant     ERYTHROMYCIN >=8 RESISTANT Resistant     GENTAMICIN <=0.5 SENSITIVE Sensitive     OXACILLIN 0.5 SENSITIVE Sensitive     TETRACYCLINE <=1 SENSITIVE Sensitive     VANCOMYCIN 1 SENSITIVE Sensitive     TRIMETH/SULFA <=10 SENSITIVE Sensitive     CLINDAMYCIN <=0.25 SENSITIVE Sensitive     RIFAMPIN <=0.5 SENSITIVE Sensitive     Inducible Clindamycin NEGATIVE Sensitive     * FEW STAPHYLOCOCCUS AUREUS  Aerobic/Anaerobic Culture w Gram Stain (surgical/deep wound)     Status: None (Preliminary result)   Collection Time: 11/18/21  3:06 PM   Specimen: PATH Other; Tissue  Result Value Ref Range Status   Specimen Description   Final    ABSCESS Performed at [facility], [address] [place], [place] [address]    Special Requests   Final    LEFT FOOT Performed at [facility], [address] [place], [place] [address]    Gram Stain   Final    NO SQUAMOUS EPITHELIAL CELLS SEEN FEW WBC SEEN FEW GRAM POSITIVE COCCI Performed at [facility], [place] [address] [place], [place] [address]    Culture   Final    FEW STAPHYLOCOCCUS AUREUS NO ANAEROBES ISOLATED; CULTURE IN PROGRESS FOR 5 DAYS    Report Status PENDING   Incomplete   Organism ID, Bacteria STAPHYLOCOCCUS AUREUS  Final      Susceptibility   Staphylococcus aureus - MIC*    CIPROFLOXACIN >=8 RESISTANT Resistant     ERYTHROMYCIN >=8 RESISTANT Resistant     GENTAMICIN <=0.5 SENSITIVE Sensitive     OXACILLIN 0.5 SENSITIVE Sensitive     TETRACYCLINE <=1 SENSITIVE Sensitive     VANCOMYCIN <=0.5 SENSITIVE Sensitive     TRIMETH/SULFA <=10 SENSITIVE Sensitive     CLINDAMYCIN <=0.25 SENSITIVE Sensitive     RIFAMPIN <=0.5 SENSITIVE Sensitive     Inducible Clindamycin NEGATIVE Sensitive     * FEW STAPHYLOCOCCUS AUREUS      RN Pressure Injury Documentation: Pressure Injury 11/18/21 Buttocks (Active)  11/18/21  Location: Buttocks  Location Orientation:   Staging:   Wound Description (Comments):   Present on Admission: Yes     Estimated body mass index is 20.52 kg/m as calculated from the following:   Height as of this encounter: 6' (1.829 m).   Weight as of this encounter: 68.6 kg.  Malnutrition Type: Nutrition Problem: Moderate Malnutrition Etiology: chronic illness (Parkinson's) Malnutrition Characteristics: Signs/Symptoms: mild fat depletion, mild muscle depletion, moderate muscle depletion Nutrition Interventions: Interventions: MVI, Hormel Shake Radiology Studies: No results found.  Scheduled Meds:  amiodarone  200 mg Oral Daily   vitamin C  500 mg Oral BID   carbidopa-levodopa  1 tablet Oral QID   Chlorhexidine Gluconate Cloth  6 each Topical Daily   donepezil  10 mg Oral Daily   levothyroxine  100 mcg Oral Q0600   metoprolol tartrate  12.5 mg Oral BID   midodrine  5 mg Oral TID WC   multivitamin with minerals  1 tablet Oral Daily   simvastatin  20 mg Oral QHS   sodium chloride flush  10-40 mL Intracatheter Q12H   Vitamin D (Ergocalciferol)  50,000 Units Oral Q7 days   zinc sulfate  220 mg Oral Daily   Continuous Infusions:  sodium chloride 10 mL/hr at 11/21/21 0708    ceFAZolin (ANCEF) IV 2 g (11/21/21 1413)     LOS: 4 days   Kerney Elbe, DO [facility] PAGER is on AMION  If 7PM-7AM, please contact night-coverage [email or web address]

## 2021-11-21 NOTE — Consult Note (Signed)
ANTICOAGULATION CONSULT NOTE  Pharmacy Consult for heparin infusion Indication: hx of afib, on xarelto PTA  Allergies  Allergen Reactions   No Known Allergies     Patient Measurements: Height: 6' (182.9 cm) Weight: 68.6 kg (151 lb 4.8 oz) IBW/kg (Calculated) : 77.6 Heparin Dosing Weight: 68.6 kg  Vital Signs: Temp: 97.6 F (36.4 C) (01/28 2040) BP: 107/60 (01/28 2040) Pulse Rate: 67 (01/28 2040)  Labs: Recent Labs    11/19/21 1045 11/20/21 0722 11/21/21 0824 11/21/21 1002 11/21/21 1541 11/21/21 2305  HGB 12.6* 13.8  --  12.6*  --   --   HCT 37.9* 42.3  --  37.7*  --   --   PLT 316 290  --  244  --   --   APTT  --   --   --   --  31  --   HEPARINUNFRC  --   --   --   --   --  0.35  CREATININE 0.52* 0.66 0.45*  --   --   --      Estimated Creatinine Clearance: 84.6 mL/min (A) (by C-G formula based on SCr of 0.45 mg/dL (L)).   Medical History: Past Medical History:  Diagnosis Date   Atrial fibrillation (HCC)    BPH (benign prostatic hyperplasia)    Cardiomyopathy (HCC)    Coronary artery disease    Hypertension    Hypothyroidism    Parkinson's disease ([place])     Medications:  PTA: xarelto 20 mg daily -- last dose 11/16/21  Assessment: 71 y.o. with a history of Afib on Xarelto PTA presented to [facility] on 11/17/21 with worsening foot wound. Found to have acute osteomyelitis. S/p 5th ray excision on 11/18/21. Xarelto has been held during admission for surgical interventions. Pharmacy consulted to initiate IV heparin therapy.  Goal of Therapy:  Heparin level 0.3-0.7 units/ml Monitor platelets by anticoagulation protocol: Yes  1/28 2305 HL 0.35, therapeutic x 1   Plan:  Continue heparin infusion at 1100 units/hr Heparin drip scheduled to stop 1/29 @ 0300 for I&D Pharmacy to follow up with anticoag plan s/p procedure  Renda Rolls, PharmD, MBA 11/22/2021 12:01 AM

## 2021-11-21 NOTE — H&P (View-Only) (Signed)
3 Days Post-Op   Subjective/Chief Complaint: Patient seen.   Objective: Vital signs in last 24 hours: Temp:  [98 F (36.7 C)-99.2 F (37.3 C)] 99.1 F (37.3 C) (01/28 1600) Pulse Rate:  [59-69] 61 (01/28 1600) Resp:  [15-18] 18 (01/28 1600) BP: (92-103)/(54-71) 93/54 (01/28 1600) SpO2:  [97 %-100 %] 98 % (01/28 1600) Last BM Date: 11/20/21  Intake/Output from previous day: 01/27 0701 - 01/28 0700 In: 494.7 [IV Piggyback:494.7] Out: 2400 [Urine:2400] Intake/Output this shift: Total I/O In: 585.2 [P.O.:360; I.V.:68.5; IV Piggyback:156.7] Out: 475 [Urine:475]  Bandage on the left foot is dry and intact.  Upon removal there is still some significant drainage noted.  Erythema appears improved in the left foot but there is still some significant purulence and expressible drainage from the incision area.  Lab Results:  Recent Labs    11/20/21 0722 11/21/21 1002  WBC 10.6* 10.6*  HGB 13.8 12.6*  HCT 42.3 37.7*  PLT 290 244   BMET Recent Labs    11/20/21 0722 11/21/21 0824  NA 139 137  K 3.8 4.3  CL 103 104  CO2 30 26  GLUCOSE 113* 82  BUN 12 10  CREATININE 0.66 0.45*  CALCIUM 8.5* 8.5*   PT/INR No results for input(s): LABPROT, INR in the last 72 hours. ABG No results for input(s): PHART, HCO3 in the last 72 hours.  Invalid input(s): PCO2, PO2  Studies/Results: No results found.  Anti-infectives: Anti-infectives (From admission, onward)    Start     Dose/Rate Route Frequency Ordered Stop   11/20/21 1800  ceFAZolin (ANCEF) IVPB 2g/100 mL premix        2 g 200 mL/hr over 30 Minutes Intravenous Every 8 hours 11/20/21 1547     11/18/21 1400  ceFEPIme (MAXIPIME) 2 g in sodium chloride 0.9 % 100 mL IVPB  Status:  Discontinued        2 g 200 mL/hr over 30 Minutes Intravenous Every 8 hours 11/18/21 1001 11/20/21 1547   11/18/21 0500  vancomycin (VANCOREADY) IVPB 1500 mg/300 mL  Status:  Discontinued        1,500 mg 150 mL/hr over 120 Minutes Intravenous  Every 12 hours 11/17/21 1611 11/17/21 1615   11/18/21 0500  vancomycin (VANCOCIN) IVPB 1000 mg/200 mL premix  Status:  Discontinued        1,000 mg 200 mL/hr over 60 Minutes Intravenous Every 12 hours 11/17/21 1615 11/20/21 1547   11/17/21 1800  piperacillin-tazobactam (ZOSYN) IVPB 3.375 g  Status:  Discontinued        3.375 g 100 mL/hr over 30 Minutes Intravenous Every 6 hours 11/17/21 1559 11/17/21 1604   11/17/21 1700  vancomycin (VANCOREADY) IVPB 500 mg/100 mL        500 mg 100 mL/hr over 60 Minutes Intravenous  Once 11/17/21 1611 11/17/21 2249   11/17/21 1615  piperacillin-tazobactam (ZOSYN) IVPB 3.375 g  Status:  Discontinued        3.375 g 12.5 mL/hr over 240 Minutes Intravenous Every 8 hours 11/17/21 1606 11/18/21 1001   11/17/21 1600  vancomycin (VANCOCIN) IVPB 1000 mg/200 mL premix  Status:  Discontinued        1,000 mg 200 mL/hr over 60 Minutes Intravenous  Once 11/17/21 1557 11/17/21 1559   11/17/21 1600  ceFEPIme (MAXIPIME) 2 g in sodium chloride 0.9 % 100 mL IVPB  Status:  Discontinued        2 g 200 mL/hr over 30 Minutes Intravenous  Once 11/17/21 1557 11/17/21 1559  11/17/21 1415  ceFEPIme (MAXIPIME) 2 g in sodium chloride 0.9 % 100 mL IVPB        2 g 200 mL/hr over 30 Minutes Intravenous  Once 11/17/21 1409 11/17/21 1512   11/17/21 1415  metroNIDAZOLE (FLAGYL) IVPB 500 mg        500 mg 100 mL/hr over 60 Minutes Intravenous  Once 11/17/21 1409 11/17/21 1623   11/17/21 1415  vancomycin (VANCOCIN) IVPB 1000 mg/200 mL premix        1,000 mg 200 mL/hr over 60 Minutes Intravenous  Once 11/17/21 1409 11/17/21 1625       Assessment/Plan: s/p Procedure(s): LEFT FIFTH RAY AMPUTATION (Left) IRRIGATION AND DEBRIDEMENT FOOT (Left) Assessment: Status post ray resection left foot with continued abscess formation.  Plan: Sterile dressing reapplied to the left foot.  Discussed with the patient the need for repeat I&D and washout of the infection remaining in the left foot.   Discussed possible risks and complications including continued infection as well as the need for repeat I&D if so.  Patient will be n.p.o. after midnight.  Consent for I&D left foot.  Plan is for surgery tomorrow morning  LOS: 4 days    [name] 11/21/2021

## 2021-11-21 NOTE — Anesthesia Preprocedure Evaluation (Addendum)
Anesthesia Evaluation  Patient identified by MRN, date of birth, ID band Patient confused  General Assessment Comment:Pt alert to self. Confused to time and injury. Pt with flexed upper extremities and neck bent and fixed to the left. Mild tremor present in upper extremities.   Reviewed: Allergy & Precautions, NPO status , Patient's Chart, lab work & pertinent test results, reviewed documented beta blocker date and time   History of Anesthesia Complications Negative for: history of anesthetic complications  Airway Mallampati: II  TM Distance: >3 FB     Dental  (+) Chipped, Poor Dentition   Pulmonary neg pulmonary ROS,    Pulmonary exam normal        Cardiovascular Exercise Tolerance: Poor hypertension, Pt. on medications (-) angina+ CAD and +CHF  (-) Past MI and (-) Cardiac Stents + dysrhythmias Atrial Fibrillation (-) Valvular Problems/Murmurs Rhythm:Irregular Rate:Normal  ECHO 6/21: 1. Left ventricular ejection fraction, by estimation, is 35 to 40%. The  left ventricle has moderately decreased function. The left ventricle  demonstrates global hypokinesis. The left ventricular internal cavity size  was moderately dilated. Left  ventricular diastolic parameters are consistent with Grade I diastolic  dysfunction (impaired relaxation).  2. Right ventricular systolic function is mildly reduced. The right  ventricular size is mildly enlarged. There is normal pulmonary artery  systolic pressure.  3. Right atrial size was moderately dilated.  4. The mitral valve is grossly normal. Trivial mitral valve  regurgitation.  5. The aortic valve is abnormal. Aortic valve regurgitation is not  visualized.    Neuro/Psych neg Seizures  Neuromuscular disease (Parkinson's disease) negative psych ROS   GI/Hepatic negative GI ROS, Elevated LFTs   Endo/Other  neg diabetesHypothyroidism   Renal/GU negative Renal ROS      Musculoskeletal   Abdominal Normal abdominal exam  (+)   Peds  Hematology  (+) anemia ,   Anesthesia Other Findings Hypophosphatemia- Being treated  Stage IV left foot wound and stage III sacral decubitus wound. Pt is on IV antibiotics.  Past Medical History: No date: Atrial fibrillation (HCC) No date: BPH (benign prostatic hyperplasia) No date: Cardiomyopathy ([place]) No date: Coronary artery disease No date: Hypertension No date: Hypothyroidism No date: Parkinson's disease [facility])   Reproductive/Obstetrics                           Anesthesia Physical  Anesthesia Plan  ASA: 3  Anesthesia Plan: General   Post-op Pain Management:    Induction: Intravenous  PONV Risk Score and Plan: 2 and Treatment may vary due to age or medical condition, Propofol infusion and TIVA  Airway Management Planned: Natural Airway and Simple Face Mask  Additional Equipment:   Intra-op Plan:   Post-operative Plan:   Informed Consent: I have reviewed the patients History and Physical, chart, labs and discussed the procedure including the risks, benefits and alternatives for the proposed anesthesia with the patient or authorized representative who has indicated his/her understanding and acceptance.     Consent reviewed with POA  Plan Discussed with: CRNA  Anesthesia Plan Comments:        Anesthesia Quick Evaluation

## 2021-11-21 NOTE — Progress Notes (Signed)
3 Days Post-Op   Subjective/Chief Complaint: Patient seen.   Objective: Vital signs in last 24 hours: Temp:  [98 F (36.7 C)-99.2 F (37.3 C)] 99.1 F (37.3 C) (01/28 1600) Pulse Rate:  [59-69] 61 (01/28 1600) Resp:  [15-18] 18 (01/28 1600) BP: (92-103)/(54-71) 93/54 (01/28 1600) SpO2:  [97 %-100 %] 98 % (01/28 1600) Last BM Date: 11/20/21  Intake/Output from previous day: 01/27 0701 - 01/28 0700 In: 494.7 [IV Piggyback:494.7] Out: 2400 [Urine:2400] Intake/Output this shift: Total I/O In: 585.2 [P.O.:360; I.V.:68.5; IV Piggyback:156.7] Out: 475 [Urine:475]  Bandage on the left foot is dry and intact.  Upon removal there is still some significant drainage noted.  Erythema appears improved in the left foot but there is still some significant purulence and expressible drainage from the incision area.  Lab Results:  Recent Labs    11/20/21 0722 11/21/21 1002  WBC 10.6* 10.6*  HGB 13.8 12.6*  HCT 42.3 37.7*  PLT 290 244   BMET Recent Labs    11/20/21 0722 11/21/21 0824  NA 139 137  K 3.8 4.3  CL 103 104  CO2 30 26  GLUCOSE 113* 82  BUN 12 10  CREATININE 0.66 0.45*  CALCIUM 8.5* 8.5*   PT/INR No results for input(s): LABPROT, INR in the last 72 hours. ABG No results for input(s): PHART, HCO3 in the last 72 hours.  Invalid input(s): PCO2, PO2  Studies/Results: No results found.  Anti-infectives: Anti-infectives (From admission, onward)    Start     Dose/Rate Route Frequency Ordered Stop   11/20/21 1800  ceFAZolin (ANCEF) IVPB 2g/100 mL premix        2 g 200 mL/hr over 30 Minutes Intravenous Every 8 hours 11/20/21 1547     11/18/21 1400  ceFEPIme (MAXIPIME) 2 g in sodium chloride 0.9 % 100 mL IVPB  Status:  Discontinued        2 g 200 mL/hr over 30 Minutes Intravenous Every 8 hours 11/18/21 1001 11/20/21 1547   11/18/21 0500  vancomycin (VANCOREADY) IVPB 1500 mg/300 mL  Status:  Discontinued        1,500 mg 150 mL/hr over 120 Minutes Intravenous  Every 12 hours 11/17/21 1611 11/17/21 1615   11/18/21 0500  vancomycin (VANCOCIN) IVPB 1000 mg/200 mL premix  Status:  Discontinued        1,000 mg 200 mL/hr over 60 Minutes Intravenous Every 12 hours 11/17/21 1615 11/20/21 1547   11/17/21 1800  piperacillin-tazobactam (ZOSYN) IVPB 3.375 g  Status:  Discontinued        3.375 g 100 mL/hr over 30 Minutes Intravenous Every 6 hours 11/17/21 1559 11/17/21 1604   11/17/21 1700  vancomycin (VANCOREADY) IVPB 500 mg/100 mL        500 mg 100 mL/hr over 60 Minutes Intravenous  Once 11/17/21 1611 11/17/21 2249   11/17/21 1615  piperacillin-tazobactam (ZOSYN) IVPB 3.375 g  Status:  Discontinued        3.375 g 12.5 mL/hr over 240 Minutes Intravenous Every 8 hours 11/17/21 1606 11/18/21 1001   11/17/21 1600  vancomycin (VANCOCIN) IVPB 1000 mg/200 mL premix  Status:  Discontinued        1,000 mg 200 mL/hr over 60 Minutes Intravenous  Once 11/17/21 1557 11/17/21 1559   11/17/21 1600  ceFEPIme (MAXIPIME) 2 g in sodium chloride 0.9 % 100 mL IVPB  Status:  Discontinued        2 g 200 mL/hr over 30 Minutes Intravenous  Once 11/17/21 1557 11/17/21 1559  11/17/21 1415  ceFEPIme (MAXIPIME) 2 g in sodium chloride 0.9 % 100 mL IVPB        2 g 200 mL/hr over 30 Minutes Intravenous  Once 11/17/21 1409 11/17/21 1512   11/17/21 1415  metroNIDAZOLE (FLAGYL) IVPB 500 mg        500 mg 100 mL/hr over 60 Minutes Intravenous  Once 11/17/21 1409 11/17/21 1623   11/17/21 1415  vancomycin (VANCOCIN) IVPB 1000 mg/200 mL premix        1,000 mg 200 mL/hr over 60 Minutes Intravenous  Once 11/17/21 1409 11/17/21 1625       Assessment/Plan: s/p Procedure(s): LEFT FIFTH RAY AMPUTATION (Left) IRRIGATION AND DEBRIDEMENT FOOT (Left) Assessment: Status post ray resection left foot with continued abscess formation.  Plan: Sterile dressing reapplied to the left foot.  Discussed with the patient the need for repeat I&D and washout of the infection remaining in the left foot.   Discussed possible risks and complications including continued infection as well as the need for repeat I&D if so.  Patient will be n.p.o. after midnight.  Consent for I&D left foot.  Plan is for surgery tomorrow morning  LOS: 4 days    [name] 11/21/2021

## 2021-11-21 NOTE — Consult Note (Signed)
ANTICOAGULATION CONSULT NOTE - Initial Consult  Pharmacy Consult for heparin infusion Indication: hx of afib, on xarelto PTA  Allergies  Allergen Reactions   No Known Allergies     Patient Measurements: Height: 6' (182.9 cm) Weight: 68.6 kg (151 lb 4.8 oz) IBW/kg (Calculated) : 77.6 Heparin Dosing Weight: 68.6 kg  Vital Signs: Temp: 98.5 F (36.9 C) (01/28 1155) BP: 93/61 (01/28 1155) Pulse Rate: 63 (01/28 1155)  Labs: Recent Labs    11/19/21 1045 11/20/21 0722 11/21/21 0824 11/21/21 1002  HGB 12.6* 13.8  --  12.6*  HCT 37.9* 42.3  --  37.7*  PLT 316 290  --  244  CREATININE 0.52* 0.66 0.45*  --     Estimated Creatinine Clearance: 84.6 mL/min (A) (by C-G formula based on SCr of 0.45 mg/dL (L)).   Medical History: Past Medical History:  Diagnosis Date   Atrial fibrillation (HCC)    BPH (benign prostatic hyperplasia)    Cardiomyopathy (HCC)    Coronary artery disease    Hypertension    Hypothyroidism    Parkinson's disease ([place])     Medications:  PTA: xarelto 20 mg daily -- last dose 11/16/21  Assessment: 71 y.o. with a history of Afib on Xarelto PTA presented to [facility] on 11/17/21 with worsening foot wound. Found to have acute osteomyelitis. S/p 5th ray excision on 11/18/21. Xarelto has been held during admission for surgical interventions. Pharmacy consulted to initiate IV heparin therapy.  Goal of Therapy:  Heparin level 0.3-0.7 units/ml Monitor platelets by anticoagulation protocol: Yes   Plan:  Give 3500 units bolus x 1 Start heparin infusion at 1100 units/hr Check anti-Xa level in 6 hours and daily while on heparin Continue to monitor H&H and platelets  Dorothe Pea, PharmD, BCPS Clinical Pharmacist   11/21/2021,3:31 PM

## 2021-11-22 ENCOUNTER — Encounter
Admission: EM | Disposition: A | Discharge status: Skilled Nursing Facility | Payer: Self-pay | Source: Skilled Nursing Facility | Attending: Internal Medicine

## 2021-11-22 ENCOUNTER — Inpatient Hospital Stay: Payer: Medicare Other | Admitting: Anesthesiology

## 2021-11-22 ENCOUNTER — Other Ambulatory Visit: Payer: Self-pay

## 2021-11-22 DIAGNOSIS — M86071 Acute hematogenous osteomyelitis, right ankle and foot: Secondary | ICD-10-CM | POA: Diagnosis not present

## 2021-11-22 DIAGNOSIS — E440 Moderate protein-calorie malnutrition: Secondary | ICD-10-CM | POA: Diagnosis not present

## 2021-11-22 DIAGNOSIS — G20 Parkinson's disease: Secondary | ICD-10-CM | POA: Diagnosis not present

## 2021-11-22 DIAGNOSIS — F02818 Dementia in other diseases classified elsewhere, unspecified severity, with other behavioral disturbance: Secondary | ICD-10-CM | POA: Diagnosis not present

## 2021-11-22 HISTORY — PX: IRRIGATION AND DEBRIDEMENT FOOT: SHX6602

## 2021-11-22 LAB — PHOSPHORUS: Phosphorus: 2.3 mg/dL — ABNORMAL LOW (ref 2.5–4.6)

## 2021-11-22 LAB — COMPREHENSIVE METABOLIC PANEL
ALT: 74 U/L — ABNORMAL HIGH (ref 0–44)
AST: 88 U/L — ABNORMAL HIGH (ref 15–41)
Albumin: 2.7 g/dL — ABNORMAL LOW (ref 3.5–5.0)
Alkaline Phosphatase: 69 U/L (ref 38–126)
Anion gap: 8 (ref 5–15)
BUN: 11 mg/dL (ref 8–23)
CO2: 26 mmol/L (ref 22–32)
Calcium: 8.6 mg/dL — ABNORMAL LOW (ref 8.9–10.3)
Chloride: 103 mmol/L (ref 98–111)
Creatinine, Ser: 0.59 mg/dL — ABNORMAL LOW (ref 0.61–1.24)
GFR, Estimated: >60 mL/min (ref >60)
Glucose, Bld: 88 mg/dL (ref 70–99)
Potassium: 4.1 mmol/L (ref 3.5–5.1)
Sodium: 137 mmol/L (ref 135–145)
Total Bilirubin: 0.9 mg/dL (ref 0.3–1.2)
Total Protein: 6.3 g/dL — ABNORMAL LOW (ref 6.5–8.1)

## 2021-11-22 LAB — CBC WITH DIFFERENTIAL/PLATELET
Abs Immature Granulocytes: 0.05 10*3/uL (ref 0.00–0.07)
Basophils Absolute: 0.1 10*3/uL (ref 0.0–0.1)
Basophils Relative: 1 %
Eosinophils Absolute: 0.1 10*3/uL (ref 0.0–0.5)
Eosinophils Relative: 1 %
HCT: 40 % (ref 39.0–52.0)
Hemoglobin: 13.3 g/dL (ref 13.0–17.0)
Immature Granulocytes: 1 %
Lymphocytes Relative: 18 %
Lymphs Abs: 1.9 10*3/uL (ref 0.7–4.0)
MCH: 31 pg (ref 26.0–34.0)
MCHC: 33.3 g/dL (ref 30.0–36.0)
MCV: 93.2 fL (ref 80.0–100.0)
Monocytes Absolute: 0.9 10*3/uL (ref 0.1–1.0)
Monocytes Relative: 9 %
Neutro Abs: 7.6 10*3/uL (ref 1.7–7.7)
Neutrophils Relative %: 70 %
Platelets: 184 10*3/uL (ref 150–400)
RBC: 4.29 MIL/uL (ref 4.22–5.81)
RDW: 15.1 % (ref 11.5–15.5)
WBC: 10.7 10*3/uL — ABNORMAL HIGH (ref 4.0–10.5)
nRBC: 0 % (ref 0.0–0.2)

## 2021-11-22 LAB — MAGNESIUM: Magnesium: 2.1 mg/dL (ref 1.7–2.4)

## 2021-11-22 LAB — CULTURE, BLOOD (ROUTINE X 2)
Culture: NO GROWTH
Culture: NO GROWTH

## 2021-11-22 SURGERY — IRRIGATION AND DEBRIDEMENT FOOT
Anesthesia: General | Laterality: Left

## 2021-11-22 MED ORDER — 0.9 % SODIUM CHLORIDE (POUR BTL) OPTIME
TOPICAL | Status: DC | PRN
  Administered 2021-11-22: 1000 mL

## 2021-11-22 MED ORDER — BUPIVACAINE HCL (PF) 0.5 % IJ SOLN
INTRAMUSCULAR | Status: DC | PRN
Start: 2021-11-22 — End: 2021-11-22
  Administered 2021-11-22: 10 mL

## 2021-11-22 MED ORDER — SODIUM CHLORIDE 0.9 % IV BOLUS
1000.0000 mL | Freq: Once | INTRAVENOUS | Status: AC
  Administered 2021-11-22: 1000 mL via INTRAVENOUS

## 2021-11-22 MED ORDER — HYDROCODONE-ACETAMINOPHEN 5-325 MG PO TABS
1.0000 | ORAL_TABLET | ORAL | Status: DC | PRN
  Administered 2021-11-23: 1 via ORAL
  Filled 2021-11-22 (×2): qty 1

## 2021-11-22 MED ORDER — PROPOFOL 10 MG/ML IV BOLUS
INTRAVENOUS | Status: DC | PRN
Start: 2021-11-22 — End: 2021-11-22
  Administered 2021-11-22 (×2): 30 mg via INTRAVENOUS

## 2021-11-22 MED ORDER — PHENYLEPHRINE HCL-NACL 20-0.9 MG/250ML-% IV SOLN
INTRAVENOUS | Status: AC
  Filled 2021-11-22: qty 250

## 2021-11-22 MED ORDER — HEPARIN BOLUS VIA INFUSION
2000.0000 [IU] | Freq: Once | INTRAVENOUS | Status: AC
  Administered 2021-11-22: 2000 [IU] via INTRAVENOUS
  Filled 2021-11-22: qty 2000

## 2021-11-22 MED ORDER — K PHOS MONO-SOD PHOS DI & MONO 155-852-130 MG PO TABS
500.0000 mg | ORAL_TABLET | Freq: Once | ORAL | Status: AC
  Administered 2021-11-22: 500 mg via ORAL
  Filled 2021-11-22: qty 2

## 2021-11-22 MED ORDER — SODIUM CHLORIDE 0.9 % IR SOLN
Status: DC | PRN
  Administered 2021-11-22: 1000 mL

## 2021-11-22 MED ORDER — KETAMINE HCL 50 MG/5ML IJ SOSY
PREFILLED_SYRINGE | INTRAMUSCULAR | Status: AC
  Filled 2021-11-22: qty 5

## 2021-11-22 MED ORDER — HEPARIN (PORCINE) 25000 UT/250ML-% IV SOLN
1250.0000 [IU]/h | INTRAVENOUS | Status: DC
  Administered 2021-11-22: 23:00:00 1100 [IU]/h via INTRAVENOUS
  Filled 2021-11-22: qty 250

## 2021-11-22 MED ORDER — KETAMINE HCL 10 MG/ML IJ SOLN
INTRAMUSCULAR | Status: DC | PRN
  Administered 2021-11-22: 10 mg via INTRAVENOUS

## 2021-11-22 MED ORDER — PROPOFOL 10 MG/ML IV BOLUS
INTRAVENOUS | Status: AC
  Filled 2021-11-22: qty 40

## 2021-11-22 MED ORDER — ACETAMINOPHEN 325 MG PO TABS: 650.0000 mg | ORAL_TABLET | Freq: Once | ORAL | Status: DC | PRN

## 2021-11-22 SURGICAL SUPPLY — 50 items
"PENCIL ELECTRO HAND CTR " (MISCELLANEOUS) ×1 IMPLANT
BLADE OSCILLATING/SAGITTAL (BLADE)
BLADE SURG 15 STRL LF DISP TIS (BLADE) ×1 IMPLANT
BLADE SURG 15 STRL SS (BLADE) ×1
BLADE SURG MINI STRL (BLADE) ×1 IMPLANT
BLADE SW THK.38XMED LNG THN (BLADE) IMPLANT
BNDG CONFORM 2 STRL LF (GAUZE/BANDAGES/DRESSINGS) ×1 IMPLANT
BNDG ELASTIC 4X5.8 VLCR NS LF (GAUZE/BANDAGES/DRESSINGS) ×2 IMPLANT
BNDG ESMARK 4X12 TAN STRL LF (GAUZE/BANDAGES/DRESSINGS) ×2 IMPLANT
BNDG GAUZE ELAST 4 BULKY (GAUZE/BANDAGES/DRESSINGS) ×2 IMPLANT
DRAPE FLUOR MINI C-ARM 54X84 (DRAPES) IMPLANT
DURAPREP 26ML APPLICATOR (WOUND CARE) ×2 IMPLANT
ELECT REM PT RETURN 9FT ADLT (ELECTROSURGICAL) ×2
ELECTRODE REM PT RTRN 9FT ADLT (ELECTROSURGICAL) ×1 IMPLANT
GAUZE SPONGE 4X4 12PLY STRL (GAUZE/BANDAGES/DRESSINGS) ×3 IMPLANT
GAUZE XEROFORM 1X8 LF (GAUZE/BANDAGES/DRESSINGS) ×2 IMPLANT
GLOVE SURG ENC MOIS LTX SZ7.5 (GLOVE) ×2 IMPLANT
GLOVE SURG UNDER LTX SZ8 (GLOVE) ×2 IMPLANT
GOWN STRL REUS W/ TWL LRG LVL3 (GOWN DISPOSABLE) ×2 IMPLANT
GOWN STRL REUS W/TWL LRG LVL3 (GOWN DISPOSABLE) ×2
HANDPIECE VERSAJET DEBRIDEMENT (MISCELLANEOUS) ×2 IMPLANT
KIT TURNOVER KIT A (KITS) ×2 IMPLANT
LABEL OR SOLS (LABEL) ×1 IMPLANT
MANIFOLD NEPTUNE II (INSTRUMENTS) ×2 IMPLANT
NDL FILTER BLUNT 18X1 1/2 (NEEDLE) ×1 IMPLANT
NDL HYPO 25X1 1.5 SAFETY (NEEDLE) ×3 IMPLANT
NEEDLE FILTER BLUNT 18X 1/2SAF (NEEDLE) ×1
NEEDLE FILTER BLUNT 18X1 1/2 (NEEDLE) ×1 IMPLANT
NEEDLE HYPO 25X1 1.5 SAFETY (NEEDLE) ×6 IMPLANT
NS IRRIG 500ML POUR BTL (IV SOLUTION) ×2 IMPLANT
PACK EXTREMITY ARMC (MISCELLANEOUS) ×2 IMPLANT
PAD ABD DERMACEA PRESS 5X9 (GAUZE/BANDAGES/DRESSINGS) ×4 IMPLANT
PENCIL ELECTRO HAND CTR (MISCELLANEOUS) ×2 IMPLANT
PULSAVAC PLUS IRRIG FAN TIP (DISPOSABLE) ×2
RASP SM TEAR CROSS CUT (RASP) IMPLANT
SOL PREP PVP 2OZ (MISCELLANEOUS) ×2
SOLUTION PREP PVP 2OZ (MISCELLANEOUS) ×1 IMPLANT
STOCKINETTE STRL 6IN 960660 (GAUZE/BANDAGES/DRESSINGS) ×2 IMPLANT
SUT ETHILON 3-0 FS-10 30 BLK (SUTURE) ×2
SUT ETHILON 4-0 (SUTURE)
SUT ETHILON 4-0 FS2 18XMFL BLK (SUTURE)
SUT VIC AB 3-0 SH 27 (SUTURE)
SUT VIC AB 3-0 SH 27X BRD (SUTURE) ×1 IMPLANT
SUT VIC AB 4-0 FS2 27 (SUTURE) ×1 IMPLANT
SUTURE EHLN 3-0 FS-10 30 BLK (SUTURE) ×1 IMPLANT
SUTURE ETHLN 4-0 FS2 18XMF BLK (SUTURE) ×1 IMPLANT
SYR 10ML LL (SYRINGE) ×3 IMPLANT
SYR 3ML LL SCALE MARK (SYRINGE) ×1 IMPLANT
TIP FAN IRRIG PULSAVAC PLUS (DISPOSABLE) IMPLANT
WATER STERILE IRR 500ML POUR (IV SOLUTION) ×2 IMPLANT

## 2021-11-22 NOTE — Anesthesia Postprocedure Evaluation (Signed)
Anesthesia Post Note  Patient: Douglas Silva.  Procedure(s) Performed: IRRIGATION AND DEBRIDEMENT FOOT (Left)  Patient location during evaluation: PACU Anesthesia Type: General Level of consciousness: awake (minimally responsive with confused answered) Pain management: pain level controlled Vital Signs Assessment: post-procedure vital signs reviewed and stable Respiratory status: spontaneous breathing, nonlabored ventilation and respiratory function stable Cardiovascular status: blood pressure returned to baseline and stable Postop Assessment: no apparent nausea or vomiting Anesthetic complications: no   No notable events documented.   Last Vitals:  Vitals:   11/22/21 1705 11/22/21 1732  BP: (!) 82/53 124/84  Pulse: (!) 55 66  Resp: 15 17  Temp: 36.8 C 36.6 C  SpO2: 100% 100%    Last Pain:  Vitals:   11/22/21 1045  TempSrc:   PainSc: Asleep                 [place] Ouch

## 2021-11-22 NOTE — Progress Notes (Addendum)
MD made aware that pt only has single lumen PICC and IV team was unsuccessful with attempt to establish second access. See note from Raynelle Fanning, RN. MD aware heparin drip is delayed due to administration of IV abx and IV NS bolus for low BP.

## 2021-11-22 NOTE — Progress Notes (Signed)
°   11/22/21 1732  Assess: MEWS Score  Temp 97.8 F (36.6 C)  BP 124/84  Pulse Rate 66  Resp 17  SpO2 100 %  O2 Device Room Air  Assess: MEWS Score  MEWS Temp 0  MEWS Systolic 0  MEWS Pulse 0  MEWS RR 0  MEWS LOC 1  MEWS Score 1  MEWS Score Color Green  Assess: if the MEWS score is Yellow or Red  Were vital signs taken at a resting state? Yes  Focused Assessment No change from prior assessment  Does the patient meet 2 or more of the SIRS criteria? No  MEWS guidelines implemented *See Row Information* No, vital signs rechecked  Document  Patient Outcome Other (Comment) (stable no interventions)  Progress note created (see row info) Yes  Assess: SIRS CRITERIA  SIRS Temperature  0  SIRS Pulse 0  SIRS Respirations  0  SIRS WBC 0  SIRS Score Sum  0

## 2021-11-22 NOTE — Progress Notes (Signed)
MD notified that pt's BP is low at this time, 82/53 with a MAP of 63. HR is 55. Pt is alert, but lethargic. MD ordered bolus and scheduled dose of midodrine given.

## 2021-11-22 NOTE — Consult Note (Signed)
ANTICOAGULATION CONSULT NOTE  Pharmacy Consult for heparin infusion Indication: hx of afib, on xarelto PTA  Allergies  Allergen Reactions   No Known Allergies     Patient Measurements: Height: 6' (182.9 cm) Weight: 68.6 kg (151 lb 4.8 oz) IBW/kg (Calculated) : 77.6 Heparin Dosing Weight: 68.6 kg  Vital Signs: Temp: 98.3 F (36.8 C) (01/29 1117) BP: 112/84 (01/29 1117) Pulse Rate: 65 (01/29 1117)  Labs: Recent Labs    11/20/21 0722 11/21/21 0824 11/21/21 1002 11/21/21 1541 11/21/21 2305 11/22/21 0624  HGB 13.8  --  12.6*  --   --  13.3  HCT 42.3  --  37.7*  --   --  40.0  PLT 290  --  244  --   --  184  APTT  --   --   --  31  --   --   HEPARINUNFRC  --   --   --   --  0.35  --   CREATININE 0.66 0.45*  --   --   --  0.59*     Estimated Creatinine Clearance: 84.6 mL/min (A) (by C-G formula based on SCr of 0.59 mg/dL (L)).   Medical History: Past Medical History:  Diagnosis Date   Atrial fibrillation (HCC)    BPH (benign prostatic hyperplasia)    Cardiomyopathy (HCC)    Coronary artery disease    Hypertension    Hypothyroidism    Parkinson's disease ([place])     Medications:  PTA: xarelto 20 mg daily -- last dose 11/16/21  Assessment: 71 y.o. with a history of Afib on Xarelto PTA presented to [facility] on 11/17/21 with worsening foot wound. Found to have acute osteomyelitis. S/p 5th ray excision on 11/18/21. Xarelto has been held during admission for surgical interventions. Pharmacy consulted to initiate IV heparin therapy.  Goal of Therapy:  Heparin level 0.3-0.7 units/ml Monitor platelets by anticoagulation protocol: Yes  1/28 2305 HL 0.35, therapeutic x 1 1/29 0300 Heparin stopped for I&D    Plan:  Heparin bolus x 2000 units Resume heparin infusion at 1100 units/hr Heparin level 6 hours following resuming CBC daily F/u long term AC plans  Dorothe Pea, PharmD, BCPS Clinical Pharmacist   11/22/2021 3:38 PM

## 2021-11-22 NOTE — Progress Notes (Signed)
A consult was placed to the hospital's IV Nurse for more access;  pt has a R arm Single lumen picc that was placed at a facility, prior to admission;  both upper extremities are contracted; unable to get additional iv access for patient's medications.   RN aware.

## 2021-11-22 NOTE — Interval H&P Note (Signed)
History and Physical Interval Note:  11/22/2021 9:18 AM  Douglas Silva.  has presented today for surgery, with the diagnosis of repeat irrigation.  The various methods of treatment have been discussed with the patient and family. After consideration of risks, benefits and other options for treatment, the patient has consented to  Procedure(s): IRRIGATION AND DEBRIDEMENT FOOT (Left) as a surgical intervention.  The patient's history has been reviewed, patient examined, no change in status, stable for surgery.  I have reviewed the patient's chart and labs.  Questions were answered to the patient's satisfaction.     [name]

## 2021-11-22 NOTE — Op Note (Signed)
Date of operation: 11/22/2021.  Surgeon: [name] D.P.M.  Preoperative diagnosis: Continued abscess left foot status post ray resection.  Postoperative diagnosis: Same.  Procedure: Repeat I&D left foot.  Anesthesia: Local MAC.  Hemostasis: None.  Estimated blood loss: 15 cc.  Complications: None apparent.  Operative indications: This is a 71 year old male with recent fifth ray resection on the left foot with continued purulence and drainage from the wound.  Decision was made for repeat I&D and washout.  Operative procedure: Patient was taken to the operating room and placed on the table in the supine position.  Following satisfactory sedation the left foot was anesthetized with 10 cc of 0.5% Marcaine plain around the fifth metatarsal and forefoot.  The foot was then prepped and draped in the usual sterile fashion.  Attention directed toward the previous incision where sutures were removed and the incision was opened up with blunt dissection using hemostats.  Plantar wound was also reopened using hemostats from a plantar approach.  Some moderate brownish devitalized tissue noted within the wound with an otherwise relatively healthy appearing bleeding base.  The wound was then thoroughly debrided and irrigated using a Versajet debrider on a setting of 3.  The wound was copiously irrigated using sterile saline with bulb syringe.  Incision area was closed using 3-0 nylon simple interrupted and figure-of-eight suture.  Plantar wound was closed using 3-0 nylon figure-of-eight suture.  Xeroform 4 x 4's ABDs and Kerlix applied to the left foot followed by a second Kerlix and Ace wrap for compression.  Patient was awakened and transported to the PACU having tolerated the anesthesia and procedures well.

## 2021-11-22 NOTE — Transfer of Care (Signed)
Immediate Anesthesia Transfer of Care Note  Patient: Douglas Silva.  Procedure(s) Performed: IRRIGATION AND DEBRIDEMENT FOOT (Left)  Patient Location: PACU  Anesthesia Type:General  Level of Consciousness: drowsy and patient cooperative  Airway & Oxygen Therapy: Patient Spontanous Breathing  Post-op Assessment: Report given to RN and Post -op Vital signs reviewed and stable  Post vital signs: Reviewed and stable  Last Vitals:  Vitals Value Taken Time  BP 123/73 11/22/21 1039  Temp    Pulse 65 11/22/21 1040  Resp 15 11/22/21 1040  SpO2 100 % 11/22/21 1040  Vitals shown include unvalidated device data.  Last Pain:  Vitals:   11/21/21 2028  TempSrc:   PainSc: 0-No pain         Complications: No notable events documented.

## 2021-11-22 NOTE — Progress Notes (Signed)
PROGRESS NOTE    Douglas Silva.  ZOX:[id number] DOB: 03/10/1951 DOA: 11/17/2021 PCP: Remi Haggard, FNP   Brief Narrative:  The patient is a 71 year old thin chronically ill-appearing Caucasian male with a past medical history significant for but not limited to atrial fibrillation, BPH, CAD, hypothyroidism, history of cardiomyopathy, history of Parkinson's disease as well as other comorbidities who presented to [facility] with left foot worsening despite chronic antibiotics at the facility with inability to take care of the wound.  He had no reported chills or fevers and no nausea or vomiting.  When he came to the ED blood pressure 97/77 temperature 97.2.  Labs were reviewed and he had a hypokalemia and elevated LFTs.  CBC showed an elevated WBC of 10.7 with anemia.  Imaging of the left foot showed soft tissue infection and wound of the lateral aspect of the foot with underlying acute osteomyelitis involving the distal fifth metatarsal at the fifth proximal phalanx.  He was initially given IV cefepime, IV vancomycin and Flagyl and admitted to a medical bed for further management and podiatry was consulted.  Podiatry felt the patient would benefit from amputation and took the patient to the OR on 11/18/2021 for Fifth ray amputation with deep wound culture given his osteomyelitis.  ID has been consulted for further antibiotic management as he has MSSA in his culture and given changes Banken cefepime to cefazolin.  They will Decide on the duration and route once we have the pathology and also assess the wound with the podiatrist.  Palliative care has been consulted for further goals of care discussion and will see the patient on Monday, 11/23/2021.  Will await further cultures for ID to evaluate and make recommendations.  Podiatry took the patient for a repeat I&D of the foot to flush out any remaining infection on 11/22/21.   Assessment & Plan:   Principal Problem:   Acute  hematogenous osteomyelitis of right foot (HCC) Active Problems:   Malnutrition of moderate degree  Acute osteomyelitis of the left foot with abscess status post left fifth ray amputation and deep wound culture s/p I & D on 11/18/21 and repeat I & D on 11/22/21 -The patient was admitted to a medical bed. -We will continue antibiotic therapy with IV vancomycin but change IV Zosyn to IV cefepime.  We will continue his antibiotics for now and consult ID for further assistance with antibiotics upon discharge -Podiatry was consulted and Dr. Vickki Muff evaluated and he felt that the patient will need an I&D and removal of all infected tissue and felt that this was likely need to be left open and packed; he also felt that this may lead to a second primary delayed primary closure down the road -He has been n.p.o. after midnight for surgical intervention later today -IV fluid hydration. -Patient's WBC went from 10.7 -> 11.6 -> 12.9 -> 10.6 x2 -> 10.7 -Continue with IV antibiotics as above and continue monitor and follow-up on podiatry recommendations and podiatry recommends closely monitoring to make sure the erythema is improving; no Kentravious purulence was noted today but Dr. Vickki Muff did note a large amount of Taras purulent drainage in the operation -Podiatry recommends ID assistance so we will consult Dr. Delaine Lame and she is changed to Vanco cefepime to IV Cefazolin for now and will decide on duration and route once the pathology as well as the assessment of the wound with a podiatrist  -Podiatry evaluated and he had some moderate drainage with some slight  dehiscence at the proximal aspect of the incision with brownish purulent drainage.  There are recommending adding Betadine and a sterile dressing  -Podiatry re-evaluated and took the patient for repeat I & D today for washout   Paroxysmal atrial fibrillation currently rate controlled ventricular response. -C/w Amiodarone 200 mg po Daily -Hold AC given Surgical  Intervention and will resume once okayed by a podiatry standpoint and will still hold given that he may undergo repeat I&D on Sunday; will change to heparin drip for now and continue to hold Oral AC; Heparin gtt held at 3:00 AM this AM and will resume later this evening   Hypokalemia -Patient's potassium was 4.1 -Continue to monitor and replete as necessary -Repeat CMP in a.m.  Hypophosphatemia -Patient's Phos level was 2.0 and is now 2.3 -Replete with po K Phos 500 mg x1 again -Continue to monitor and replete as necessary -Repeat phosphorus level in a.m.  Abnormal LFTs -Slightly worsening initially but now improving  -Patient's AST peaked to 170 and is now improving to 106 -> 88 -ALT peaked at 129 and is now improving to 35 but now slightly bumped to 74 -IVF Hydration now stopped  -Continue to monitor and trend hepatic function carefully and if necessary will obtain a right upper quadrant ultrasound as well as an acute hepatitis panel in the morning -Repeat CMP in a.m.  Hypothyroidism. -Continue Levothyroxine. -Checked TSH 4.105  Dementia and Parkinson's Disease. -Continue home Sinemet IR 25-100 mg 1 tab p.o. 4 times daily as well as donepezil 10 mg p.o. daily -Has significant atrophy and muscle wasting in the setting of his Parkinson's disease -He was somnolent after Surgery  -We will consult palliative care for goals of care discussion and this is to happen on Monday, 11/23/2020  Coronary artery disease. -Continue metoprolol tartrate 12.5 mg p.o. twice daily as well as Simvastatin 20 mg p.o. nightly  Dyslipidemia. -Continue simvastatin 20 mg p.o. nightly  Normocytic Anemia -Patient's hemoglobin/hematocrit went from 12.8/39.3 -> 12.1/37.0 -> 12.6/37.9 -> 13.8/42.3 -> 12.6/37.7 -> 13.3/40.0 -Anemia panel was checked and showed an iron level of 57, U IBC 163, TIBC 220, saturation ratios of 26%, ferritin level of 510, folate level of 9.5, vitamin B12 548 -Continue to monitor for  signs and symptoms bleeding; no overt bleeding noted -Repeat CBC in the a.m.  Moderate nonsevere malnutrition in the context of chronic illness -Nutritionist consulted for further evaluation recommendations -Estimated body mass index is 20.52 kg/m as calculated from the following:   Height as of this encounter: 6' (1.829 m).   Weight as of this encounter: 68.6 kg. -Discharged recommending mighty shakes 3 times daily with meals as well as multivitamin with minerals daily as well as 500 mg of vitamin C twice daily and to 20 mg of zinc sulfate daily for 14 days and feeding assistance with meals  DVT prophylaxis: Anticoagulated with Heparin gtt  Code Status: FULL CODE Family Communication: No family present at bedside  Disposition Plan: Pending further clinical improvement and clearance by Podiatry  Status is: Inpatient  Remains inpatient appropriate because: He will be undergoing surgical intervention today by podiatry  Consultants:  Podiatry  Infectious Diseases Palliative care  Procedures: Procedure: Left fifth ray amputation done by Dr. Samara Deist  Antimicrobials:  Anti-infectives (From admission, onward)    Start     Dose/Rate Route Frequency Ordered Stop   11/20/21 1800  ceFAZolin (ANCEF) IVPB 2g/100 mL premix        2 g 200 mL/hr over 30  Minutes Intravenous Every 8 hours 11/20/21 1547     11/18/21 1400  ceFEPIme (MAXIPIME) 2 g in sodium chloride 0.9 % 100 mL IVPB  Status:  Discontinued        2 g 200 mL/hr over 30 Minutes Intravenous Every 8 hours 11/18/21 1001 11/20/21 1547   11/18/21 0500  vancomycin (VANCOREADY) IVPB 1500 mg/300 mL  Status:  Discontinued        1,500 mg 150 mL/hr over 120 Minutes Intravenous Every 12 hours 11/17/21 1611 11/17/21 1615   11/18/21 0500  vancomycin (VANCOCIN) IVPB 1000 mg/200 mL premix  Status:  Discontinued        1,000 mg 200 mL/hr over 60 Minutes Intravenous Every 12 hours 11/17/21 1615 11/20/21 1547   11/17/21 1800   piperacillin-tazobactam (ZOSYN) IVPB 3.375 g  Status:  Discontinued        3.375 g 100 mL/hr over 30 Minutes Intravenous Every 6 hours 11/17/21 1559 11/17/21 1604   11/17/21 1700  vancomycin (VANCOREADY) IVPB 500 mg/100 mL        500 mg 100 mL/hr over 60 Minutes Intravenous  Once 11/17/21 1611 11/17/21 2249   11/17/21 1615  piperacillin-tazobactam (ZOSYN) IVPB 3.375 g  Status:  Discontinued        3.375 g 12.5 mL/hr over 240 Minutes Intravenous Every 8 hours 11/17/21 1606 11/18/21 1001   11/17/21 1600  vancomycin (VANCOCIN) IVPB 1000 mg/200 mL premix  Status:  Discontinued        1,000 mg 200 mL/hr over 60 Minutes Intravenous  Once 11/17/21 1557 11/17/21 1559   11/17/21 1600  ceFEPIme (MAXIPIME) 2 g in sodium chloride 0.9 % 100 mL IVPB  Status:  Discontinued        2 g 200 mL/hr over 30 Minutes Intravenous  Once 11/17/21 1557 11/17/21 1559   11/17/21 1415  ceFEPIme (MAXIPIME) 2 g in sodium chloride 0.9 % 100 mL IVPB        2 g 200 mL/hr over 30 Minutes Intravenous  Once 11/17/21 1409 11/17/21 1512   11/17/21 1415  metroNIDAZOLE (FLAGYL) IVPB 500 mg        500 mg 100 mL/hr over 60 Minutes Intravenous  Once 11/17/21 1409 11/17/21 1623   11/17/21 1415  vancomycin (VANCOCIN) IVPB 1000 mg/200 mL premix        1,000 mg 200 mL/hr over 60 Minutes Intravenous  Once 11/17/21 1409 11/17/21 1625       Subjective: Seen and examined at bedside and he is somnolent and drowsy after his repeat I&D.  He is not able to provide a subjective history.  Resting comfortably.  No other concerns or complaints at this time.  Objective: Vitals:   11/22/21 1039 11/22/21 1045 11/22/21 1100 11/22/21 1117  BP: 123/73 114/68 114/68 112/84  Pulse:  (!) 55 (!) 57 65  Resp: 12 15 16 15   Temp:   (!) 97.5 F (36.4 C) 98.3 F (36.8 C)  TempSrc:      SpO2: 99% 99% 99% 96%  Weight:      Height:        Intake/Output Summary (Last 24 hours) at 11/22/2021 1131 Last data filed at 11/22/2021 1029 Gross per 24 hour   Intake 1061.09 ml  Output 1190 ml  Net -128.91 ml    Filed Weights   11/17/21 1354  Weight: 68.6 kg   Examination: Physical Exam:  Constitutional: Thin chronically ill-appearing Caucasian male who is significantly contracted and has atrophy who is somnolent and drowsy Eyes:  Lids normal  ENMT: External Ears, Nose appear normal. Neck: Appears normal, supple, no cervical masses, normal ROM, no appreciable thyromegaly; no JVD Respiratory: Diminished to auscultation bilaterally, no wheezing, rales, rhonchi or crackles. Normal respiratory effort and patient is not tachypenic. No accessory muscle use. Unlabored breathing  Cardiovascular: RRR, no murmurs / rubs / gallops. S1 and S2 auscultated.  Abdomen: Soft, non-tender, non-distended. Bowel sounds positive.  GU: Deferred. Musculoskeletal: No clubbing / cyanosis of digits/nails.  Has some contractures and atrophy and has a right fifth toe ray amputation Skin: No rashes, lesions, ulcers on limited skin evaluation and right foot is bandaged. No induration; Warm and dry.  Neurologic: Does not follow commands as he is somnolent and drowsy Psychiatric: Impaired judgment and insight.  He is not awake or alert and he is somnolent and drowsy  Data Reviewed: I have personally reviewed following labs and imaging studies  CBC: Recent Labs  Lab 11/17/21 1406 11/18/21 0736 11/19/21 1045 11/20/21 0722 11/21/21 1002 11/22/21 0624  WBC 10.7* 11.6* 12.9* 10.6* 10.6* 10.7*  NEUTROABS 7.9*  --  9.4* 7.4 7.6 7.6  HGB 12.8* 12.1* 12.6* 13.8 12.6* 13.3  HCT 39.3 37.0* 37.9* 42.3 37.7* 40.0  MCV 95.6 93.2 92.0 94.2 94.7 93.2  PLT 300 300 316 290 244 774    Basic Metabolic Panel: Recent Labs  Lab 11/18/21 0500 11/18/21 0736 11/19/21 1045 11/20/21 0722 11/21/21 0824 11/22/21 0624  NA  --  137 138 139 137 137  K  --  3.3* 3.4* 3.8 4.3 4.1  CL  --  103 103 103 104 103  CO2  --  28 29 30 26 26   GLUCOSE  --  101* 103* 113* 82 88  BUN  --  19  11 12 10 11   CREATININE  --  0.61 0.52* 0.66 0.45* 0.59*  CALCIUM  --  8.2* 8.6* 8.5* 8.5* 8.6*  MG 1.9  --  1.7 2.2 2.1 2.1  PHOS 2.2*  --  1.9* 2.0* 2.3* 2.3*    GFR: Estimated Creatinine Clearance: 84.6 mL/min (A) (by C-G formula based on SCr of 0.59 mg/dL (L)). Liver Function Tests: Recent Labs  Lab 11/18/21 0500 11/19/21 1045 11/20/21 0722 11/21/21 0824 11/22/21 0624  AST 135* 170* 149* 106* 88*  ALT 106* 129* 73* 35 74*  ALKPHOS 57 61 72 66 69  BILITOT 0.8 1.0 0.8 0.6 0.9  PROT 5.3* 5.8* 6.3* 5.8* 6.3*  ALBUMIN 2.3* 2.5* 2.7* 2.5* 2.7*    No results for input(s): LIPASE, AMYLASE in the last 168 hours. No results for input(s): AMMONIA in the last 168 hours. Coagulation Profile: Recent Labs  Lab 11/17/21 1406  INR 1.5*    Cardiac Enzymes: No results for input(s): CKTOTAL, CKMB, CKMBINDEX, TROPONINI in the last 168 hours. BNP (last 3 results) No results for input(s): PROBNP in the last 8760 hours. HbA1C: No results for input(s): HGBA1C in the last 72 hours. CBG: Recent Labs  Lab 11/18/21 1849 11/18/21 2013  GLUCAP 86 96    Lipid Profile: No results for input(s): CHOL, HDL, LDLCALC, TRIG, CHOLHDL, LDLDIRECT in the last 72 hours. Thyroid Function Tests: Recent Labs    11/21/21 0824  TSH 4.105    Anemia Panel: Recent Labs    11/20/21 0722  VITAMINB12 548  FOLATE 9.5  FERRITIN 510*  TIBC 220*  IRON 57  RETICCTPCT 1.7    Sepsis Labs: Recent Labs  Lab 11/17/21 1406  LATICACIDVEN 1.3     Recent Results (from the  past 240 hour(s))  Blood Culture (routine x 2)     Status: None   Collection Time: 11/17/21  2:06 PM   Specimen: BLOOD  Result Value Ref Range Status   Specimen Description BLOOD PICC LINE  Final   Special Requests BOTTLES DRAWN AEROBIC AND ANAEROBIC BCAV  Final   Culture   Final    NO GROWTH 5 DAYS Performed at [facility], [address] [place], [place] [address]    Report Status 11/22/2021 FINAL  Final  Resp  Panel by RT-PCR (Flu A&B, Covid) Nasopharyngeal Swab     Status: None   Collection Time: 11/17/21  2:42 PM   Specimen: Nasopharyngeal Swab; Nasopharyngeal(NP) swabs in vial transport medium  Result Value Ref Range Status   SARS Coronavirus 2 by RT PCR NEGATIVE NEGATIVE Final    Comment: (NOTE) SARS-CoV-2 target nucleic acids are NOT DETECTED.  The SARS-CoV-2 RNA is generally detectable in upper respiratory specimens during the acute phase of infection. The lowest concentration of SARS-CoV-2 viral copies this assay can detect is 138 copies/mL. A negative result does not preclude SARS-Cov-2 infection and should not be used as the sole basis for treatment or other patient management decisions. A negative result may occur with  improper specimen collection/handling, submission of specimen other than nasopharyngeal swab, presence of viral mutation(s) within the areas targeted by this assay, and inadequate number of viral copies(<138 copies/mL). A negative result must be combined with clinical observations, patient history, and epidemiological information. The expected result is Negative.  Fact Sheet for Patients:  EntrepreneurPulse.com.au  Fact Sheet for Healthcare Providers:  IncredibleEmployment.be  This test is no t yet approved or cleared by the [place] FDA and  has been authorized for detection and/or diagnosis of SARS-CoV-2 by FDA under an Emergency Use Authorization (EUA). This EUA will remain  in effect (meaning this test can be used) for the duration of the COVID-19 declaration under Section 564(b)(1) of the Act, 21 U.S.C.section 360bbb-3(b)(1), unless the authorization is terminated  or revoked sooner.       Influenza A by PCR NEGATIVE NEGATIVE Final   Influenza B by PCR NEGATIVE NEGATIVE Final    Comment: (NOTE) The Xpert Xpress SARS-CoV-2/FLU/RSV plus assay is intended as an aid in the diagnosis of influenza from Nasopharyngeal  swab specimens and should not be used as a sole basis for treatment. Nasal washings and aspirates are unacceptable for Xpert Xpress SARS-CoV-2/FLU/RSV testing.  Fact Sheet for Patients: EntrepreneurPulse.com.au  Fact Sheet for Healthcare Providers: IncredibleEmployment.be  This test is not yet approved or cleared by the [place] FDA and has been authorized for detection and/or diagnosis of SARS-CoV-2 by FDA under an Emergency Use Authorization (EUA). This EUA will remain in effect (meaning this test can be used) for the duration of the COVID-19 declaration under Section 564(b)(1) of the Act, 21 U.S.C. section 360bbb-3(b)(1), unless the authorization is terminated or revoked.  Performed at [facility], [address] [place], [place] [address]   Blood Culture (routine x 2)     Status: None   Collection Time: 11/17/21  2:42 PM   Specimen: BLOOD  Result Value Ref Range Status   Specimen Description BLOOD ARM  Final   Special Requests BOTTLES DRAWN AEROBIC AND ANAEROBIC BCAV  Final   Culture   Final    NO GROWTH 5 DAYS Performed at [facility], [address] [place], [place] [address]    Report Status 11/22/2021 FINAL  Final  Aerobic/Anaerobic  Culture w Gram Stain (surgical/deep wound)     Status: None (Preliminary result)   Collection Time: 11/17/21  6:33 PM   Specimen: Foot; Abscess  Result Value Ref Range Status   Specimen Description   Final    FOOT Performed at [facility], [address] [place], [place] [address]    Special Requests   Final    LEFT Performed at [facility], [address] [place], [place] [address]    Gram Stain   Final    MODERATE WBC PRESENT,BOTH PMN AND MONONUCLEAR MODERATE GRAM POSITIVE COCCI RARE GRAM NEGATIVE RODS Performed at [facility], [place] [address], [place], [place] [address]    Culture   Final    FEW STAPHYLOCOCCUS AUREUS NO ANAEROBES  ISOLATED; CULTURE IN PROGRESS FOR 5 DAYS    Report Status PENDING  Incomplete   Organism ID, Bacteria STAPHYLOCOCCUS AUREUS  Final      Susceptibility   Staphylococcus aureus - MIC*    CIPROFLOXACIN >=8 RESISTANT Resistant     ERYTHROMYCIN >=8 RESISTANT Resistant     GENTAMICIN <=0.5 SENSITIVE Sensitive     OXACILLIN 0.5 SENSITIVE Sensitive     TETRACYCLINE <=1 SENSITIVE Sensitive     VANCOMYCIN 1 SENSITIVE Sensitive     TRIMETH/SULFA <=10 SENSITIVE Sensitive     CLINDAMYCIN <=0.25 SENSITIVE Sensitive     RIFAMPIN <=0.5 SENSITIVE Sensitive     Inducible Clindamycin NEGATIVE Sensitive     * FEW STAPHYLOCOCCUS AUREUS  Aerobic/Anaerobic Culture w Gram Stain (surgical/deep wound)     Status: None (Preliminary result)   Collection Time: 11/18/21  3:06 PM   Specimen: PATH Other; Tissue  Result Value Ref Range Status   Specimen Description   Final    ABSCESS Performed at [facility], [address], [place], [place] [address]    Special Requests   Final    LEFT FOOT Performed at [facility], [address] [place], [place] [address]    Gram Stain   Final    NO SQUAMOUS EPITHELIAL CELLS SEEN FEW WBC SEEN FEW GRAM POSITIVE COCCI Performed at [facility], [place] [address], [place],  [address]    Culture   Final    FEW STAPHYLOCOCCUS AUREUS NO ANAEROBES ISOLATED; CULTURE IN PROGRESS FOR 5 DAYS    Report Status PENDING  Incomplete   Organism ID, Bacteria STAPHYLOCOCCUS AUREUS  Final      Susceptibility   Staphylococcus aureus - MIC*    CIPROFLOXACIN >=8 RESISTANT Resistant     ERYTHROMYCIN >=8 RESISTANT Resistant     GENTAMICIN <=0.5 SENSITIVE Sensitive     OXACILLIN 0.5 SENSITIVE Sensitive     TETRACYCLINE <=1 SENSITIVE Sensitive     VANCOMYCIN <=0.5 SENSITIVE Sensitive     TRIMETH/SULFA <=10 SENSITIVE Sensitive     CLINDAMYCIN <=0.25 SENSITIVE Sensitive     RIFAMPIN <=0.5 SENSITIVE Sensitive     Inducible Clindamycin NEGATIVE Sensitive      * FEW STAPHYLOCOCCUS AUREUS      RN Pressure Injury Documentation: Pressure Injury 11/18/21 Buttocks (Active)  11/18/21   Location: Buttocks  Location Orientation:   Staging:   Wound Description (Comments):   Present on Admission: Yes     Estimated body mass index is 20.52 kg/m as calculated from the following:   Height as of this encounter: 6' (1.829 m).   Weight as of this encounter: 68.6 kg.  Malnutrition Type: Nutrition Problem: Moderate Malnutrition Etiology: chronic illness (Parkinson's) Malnutrition  Characteristics: Signs/Symptoms: mild fat depletion, mild muscle depletion, moderate muscle depletion Nutrition Interventions: Interventions: MVI, Hormel Shake Radiology Studies: No results found.  Scheduled Meds:  amiodarone  200 mg Oral Daily   vitamin C  500 mg Oral BID   carbidopa-levodopa  1 tablet Oral QID   Chlorhexidine Gluconate Cloth  6 each Topical Daily   donepezil  10 mg Oral Daily   levothyroxine  100 mcg Oral Q0600   metoprolol tartrate  12.5 mg Oral BID   midodrine  5 mg Oral TID WC   multivitamin with minerals  1 tablet Oral Daily   simvastatin  20 mg Oral QHS   sodium chloride flush  10-40 mL Intracatheter Q12H   Vitamin D (Ergocalciferol)  50,000 Units Oral Q7 days   zinc sulfate  220 mg Oral Daily   Continuous Infusions:  sodium chloride 10 mL/hr at 11/22/21 0615    ceFAZolin (ANCEF) IV 2 g (11/22/21 0616)    LOS: 5 days   Douglas Elbe, DO [facility] PAGER is on AMION  If 7PM-7AM, please contact night-coverage [email or web address]

## 2021-11-23 ENCOUNTER — Encounter: Payer: Self-pay | Admitting: Podiatry

## 2021-11-23 DIAGNOSIS — F02818 Dementia in other diseases classified elsewhere, unspecified severity, with other behavioral disturbance: Secondary | ICD-10-CM | POA: Diagnosis not present

## 2021-11-23 DIAGNOSIS — Z7189 Other specified counseling: Secondary | ICD-10-CM

## 2021-11-23 DIAGNOSIS — Z515 Encounter for palliative care: Secondary | ICD-10-CM | POA: Diagnosis not present

## 2021-11-23 DIAGNOSIS — L089 Local infection of the skin and subcutaneous tissue, unspecified: Secondary | ICD-10-CM | POA: Diagnosis not present

## 2021-11-23 DIAGNOSIS — E440 Moderate protein-calorie malnutrition: Secondary | ICD-10-CM | POA: Diagnosis not present

## 2021-11-23 DIAGNOSIS — M86071 Acute hematogenous osteomyelitis, right ankle and foot: Secondary | ICD-10-CM

## 2021-11-23 DIAGNOSIS — G20 Parkinson's disease: Secondary | ICD-10-CM | POA: Diagnosis not present

## 2021-11-23 LAB — MAGNESIUM: Magnesium: 1.9 mg/dL (ref 1.7–2.4)

## 2021-11-23 LAB — CBC WITH DIFFERENTIAL/PLATELET
Abs Immature Granulocytes: 0.05 10*3/uL (ref 0.00–0.07)
Basophils Absolute: 0.1 10*3/uL (ref 0.0–0.1)
Basophils Relative: 0 %
Eosinophils Absolute: 0.1 10*3/uL (ref 0.0–0.5)
Eosinophils Relative: 1 %
HCT: 33 % — ABNORMAL LOW (ref 39.0–52.0)
Hemoglobin: 10.9 g/dL — ABNORMAL LOW (ref 13.0–17.0)
Immature Granulocytes: 0 %
Lymphocytes Relative: 16 %
Lymphs Abs: 1.9 10*3/uL (ref 0.7–4.0)
MCH: 30.7 pg (ref 26.0–34.0)
MCHC: 33 g/dL (ref 30.0–36.0)
MCV: 93 fL (ref 80.0–100.0)
Monocytes Absolute: 1 10*3/uL (ref 0.1–1.0)
Monocytes Relative: 8 %
Neutro Abs: 9 10*3/uL — ABNORMAL HIGH (ref 1.7–7.7)
Neutrophils Relative %: 75 %
Platelets: 233 10*3/uL (ref 150–400)
RBC: 3.55 MIL/uL — ABNORMAL LOW (ref 4.22–5.81)
RDW: 14.9 % (ref 11.5–15.5)
WBC: 12.1 10*3/uL — ABNORMAL HIGH (ref 4.0–10.5)
nRBC: 0 % (ref 0.0–0.2)

## 2021-11-23 LAB — AEROBIC/ANAEROBIC CULTURE W GRAM STAIN (SURGICAL/DEEP WOUND): Gram Stain: NONE SEEN

## 2021-11-23 LAB — COMPREHENSIVE METABOLIC PANEL
ALT: 113 U/L — ABNORMAL HIGH (ref 0–44)
AST: 68 U/L — ABNORMAL HIGH (ref 15–41)
Albumin: 2.2 g/dL — ABNORMAL LOW (ref 3.5–5.0)
Alkaline Phosphatase: 61 U/L (ref 38–126)
Anion gap: 5 (ref 5–15)
BUN: 12 mg/dL (ref 8–23)
CO2: 29 mmol/L (ref 22–32)
Calcium: 8.1 mg/dL — ABNORMAL LOW (ref 8.9–10.3)
Chloride: 104 mmol/L (ref 98–111)
Creatinine, Ser: 0.71 mg/dL (ref 0.61–1.24)
GFR, Estimated: >60 mL/min (ref >60)
Glucose, Bld: 122 mg/dL — ABNORMAL HIGH (ref 70–99)
Potassium: 3.8 mmol/L (ref 3.5–5.1)
Sodium: 138 mmol/L (ref 135–145)
Total Bilirubin: 0.5 mg/dL (ref 0.3–1.2)
Total Protein: 5.3 g/dL — ABNORMAL LOW (ref 6.5–8.1)

## 2021-11-23 LAB — HEPARIN LEVEL (UNFRACTIONATED): Heparin Unfractionated: 0.22 IU/mL — ABNORMAL LOW (ref 0.30–0.70)

## 2021-11-23 LAB — SURGICAL PATHOLOGY

## 2021-11-23 LAB — PHOSPHORUS: Phosphorus: 1.7 mg/dL — ABNORMAL LOW (ref 2.5–4.6)

## 2021-11-23 MED ORDER — ENOXAPARIN SODIUM 80 MG/0.8ML IJ SOSY
1.0000 mg/kg | PREFILLED_SYRINGE | Freq: Two times a day (BID) | INTRAMUSCULAR | Status: DC
  Administered 2021-11-23 – 2021-11-24 (×3): 67.5 mg via SUBCUTANEOUS
  Filled 2021-11-23 (×4): qty 0.68

## 2021-11-23 MED ORDER — POTASSIUM PHOSPHATES 15 MMOLE/5ML IV SOLN
30.0000 mmol | Freq: Once | INTRAVENOUS | Status: AC
  Administered 2021-11-23: 30 mmol via INTRAVENOUS
  Filled 2021-11-23: qty 10

## 2021-11-23 MED ORDER — HEPARIN BOLUS VIA INFUSION
1000.0000 [IU] | Freq: Once | INTRAVENOUS | Status: AC
Start: 2021-11-23 — End: 2021-11-23
  Administered 2021-11-23: 1000 [IU] via INTRAVENOUS
  Filled 2021-11-23: qty 1000

## 2021-11-23 MED ORDER — SODIUM CHLORIDE 0.9 % IV BOLUS
500.0000 mL | Freq: Once | INTRAVENOUS | Status: AC
  Administered 2021-11-23: 500 mL via INTRAVENOUS

## 2021-11-23 NOTE — Progress Notes (Signed)
PROGRESS NOTE    Douglas Silva.  CXK:[id number] DOB: [birth year] DOA: 11/17/2021 PCP: Remi Haggard, FNP   Brief Narrative:  The patient is a 71 year old thin chronically ill-appearing Caucasian male with a past medical history significant for but not limited to atrial fibrillation, BPH, CAD, hypothyroidism, history of cardiomyopathy, history of Parkinson's disease as well as other comorbidities who presented to [facility] with left foot worsening despite chronic antibiotics at the facility with inability to take care of the wound.  He had no reported chills or fevers and no nausea or vomiting.  When he came to the ED blood pressure 97/77 temperature 97.2.  Labs were reviewed and he had a hypokalemia and elevated LFTs.  CBC showed an elevated WBC of 10.7 with anemia.  Imaging of the left foot showed soft tissue infection and wound of the lateral aspect of the foot with underlying acute osteomyelitis involving the distal fifth metatarsal at the fifth proximal phalanx.  He was initially given IV cefepime, IV vancomycin and Flagyl and admitted to a medical bed for further management and podiatry was consulted.  Podiatry felt the patient would benefit from amputation and took the patient to the OR on 11/18/2021 for Fifth ray amputation with deep wound culture given his osteomyelitis.  ID has been consulted for further antibiotic management as he has MSSA in his culture and given changes Banken cefepime to cefazolin.  They will Decide on the duration and route once we have the pathology and also assess the wound with the podiatrist.  Palliative care has been consulted for further goals of care discussion and will see the patient on Monday, 11/23/2021.  Will await further cultures for ID to evaluate and make recommendations.  Podiatry took the patient for a repeat I&D of the foot to flush out any remaining infection on 11/22/21.  The wound was reevaluated by the podiatrist and ID Dr.  Merdis Delay felt that the patient be discharged from a podiatry standpoint pending appropriate antibiotics from ID.  They recommended dressing changes every 2 to 3 days for the removal drainage and reassessment with outpatient plan for reevaluation in the coming weeks.  Given that he was on a heparin drip will change to Lovenox and then change him back to his p.o. apixaban.  Assessment & Plan:   Principal Problem:   Acute hematogenous osteomyelitis of right foot (HCC) Active Problems:   Malnutrition of moderate degree  Acute osteomyelitis of the left foot with abscess status post left fifth ray amputation and deep wound culture s/p I & D on 11/18/21 and repeat I & D on 11/22/21 -The patient was admitted to a medical bed. -We will continue antibiotic therapy with IV vancomycin but change IV Zosyn to IV cefepime.  We will continue his antibiotics for now and consult ID for further assistance with antibiotics upon discharge -Podiatry was consulted and Dr. Vickki Muff evaluated and he felt that the patient will need an I&D and removal of all infected tissue and felt that this was likely need to be left open and packed; he also felt that this may lead to a second primary delayed primary closure down the road -He has been n.p.o. after midnight for surgical intervention later today -IV fluid hydration. -Patient's WBC went from 10.7 -> 11.6 -> 12.9 -> 10.6 x2 -> 10.7 and slightly trended up to 12.1 -Continue with IV antibiotics as above and continue monitor and follow-up on podiatry recommendations and podiatry recommends closely monitoring to make sure the  erythema is improving; no Yeshua purulence was noted today but Dr. Vickki Muff did note a large amount of Lex purulent drainage in the operation -Podiatry recommends ID assistance so we will consult Dr. Delaine Lame and she is changed to Vanco cefepime to IV Cefazolin for now and will decide on duration and route once the pathology as well as the assessment of the  wound with a podiatrist  -Podiatry evaluated and he had some moderate drainage with some slight dehiscence at the proximal aspect of the incision with brownish purulent drainage.  There are recommending adding Betadine and a sterile dressing  -Podiatry re-evaluated and took the patient for repeat I & D yesterday for washout and they feel the patient is stable from a podiatry standpoint and recommending discharge antibiotics we defer to ID, Dr. Cleda Mccreedy and Dr. Delaine Lame Reviewed the wound together  Paroxysmal atrial fibrillation currently rate controlled ventricular response. -C/w Amiodarone 200 mg po Daily -Hold AC given Surgical Intervention and will resume once okayed by a podiatry standpoint and was held given that he underwent repeat I&D on Sunday; will change to heparin drip for now and continue to hold Oral AC; heparin drip was resumed yesterday but given that patient's IV access is not the best so we will change to subcu Lovenox and then transition back to his home apixaban  Hypokalemia -Patient's potassium was 3.8 -Continue to monitor and replete as necessary -Repeat CMP in a.m.  Hypophosphatemia -Patient's Phos level is now 1.7 -Replete with IV K Phos 30 mmol -Continue to monitor and replete as necessary -Repeat phosphorus level in a.m.  Abnormal LFTs -Slightly worsening initially but now improving  -Patient's AST peaked to 170 and is now improving to 106 -> 88 -ALT peaked at 129 and is now improving to 35 but now slightly bumped to 74 has now worsened to 113 -IVF Hydration now stopped  -Continue to monitor and trend hepatic function carefully and if necessary will obtain a right upper quadrant ultrasound as well as an acute hepatitis panel in the morning if continues to still be elevated and not improving -Repeat CMP in a.m.  Hypothyroidism. -Continue Levothyroxine. -Checked TSH 4.105  Dementia and Parkinson's Disease. -Continue home Sinemet IR 25-100 mg 1 tab p.o. 4 times  daily as well as donepezil 10 mg p.o. daily -Has significant atrophy and muscle wasting in the setting of his Parkinson's disease -He was somnolent after Surgery  -Palliative consulted for goals of care discussion  Coronary artery disease. -Continue metoprolol tartrate 12.5 mg p.o. twice daily as well as Simvastatin 20 mg p.o. nightly  Dyslipidemia. -Continue simvastatin 20 mg p.o. nightly  Normocytic Anemia -Patient's hemoglobin/hematocrit went from 12.8/39.3 -> 12.1/37.0 -> 12.6/37.9 -> 13.8/42.3 -> 12.6/37.7 -> 13.3/40.0 and is now slightly dropped to 10.9/33 point -Anemia panel was checked and showed an iron level of 57, U IBC 163, TIBC 220, saturation ratios of 26%, ferritin level of 510, folate level of 9.5, vitamin B12 548 -Continue to monitor for signs and symptoms bleeding; no overt bleeding noted -Repeat CBC in the a.m.  Moderate nonsevere malnutrition in the context of chronic illness -Nutritionist consulted for further evaluation recommendations -Estimated body mass index is 20.52 kg/m as calculated from the following:   Height as of this encounter: 6' (1.829 m).   Weight as of this encounter: 68.6 kg. -Discharged recommending mighty shakes 3 times daily with meals as well as multivitamin with minerals daily as well as 500 mg of vitamin C twice daily and to 20 mg  of zinc sulfate daily for 14 days and feeding assistance with meals  DVT prophylaxis: Anticoagulated with Heparin gtt  Code Status: FULL CODE Family Communication: No family present at bedside  Disposition Plan: Pending further clinical improvement and clearance by Podiatry and ID; Will need to go to SNF  Status is: Inpatient  Remains inpatient appropriate because: He will be undergoing surgical intervention today by podiatry  Consultants:  Podiatry  Infectious Diseases Palliative Care  Procedures: Procedure: Left fifth ray amputation done by Dr. Samara Deist  Antimicrobials:  Anti-infectives (From  admission, onward)    Start     Dose/Rate Route Frequency Ordered Stop   11/20/21 1800  ceFAZolin (ANCEF) IVPB 2g/100 mL premix        2 g 200 mL/hr over 30 Minutes Intravenous Every 8 hours 11/20/21 1547     11/18/21 1400  ceFEPIme (MAXIPIME) 2 g in sodium chloride 0.9 % 100 mL IVPB  Status:  Discontinued        2 g 200 mL/hr over 30 Minutes Intravenous Every 8 hours 11/18/21 1001 11/20/21 1547   11/18/21 0500  vancomycin (VANCOREADY) IVPB 1500 mg/300 mL  Status:  Discontinued        1,500 mg 150 mL/hr over 120 Minutes Intravenous Every 12 hours 11/17/21 1611 11/17/21 1615   11/18/21 0500  vancomycin (VANCOCIN) IVPB 1000 mg/200 mL premix  Status:  Discontinued        1,000 mg 200 mL/hr over 60 Minutes Intravenous Every 12 hours 11/17/21 1615 11/20/21 1547   11/17/21 1800  piperacillin-tazobactam (ZOSYN) IVPB 3.375 g  Status:  Discontinued        3.375 g 100 mL/hr over 30 Minutes Intravenous Every 6 hours 11/17/21 1559 11/17/21 1604   11/17/21 1700  vancomycin (VANCOREADY) IVPB 500 mg/100 mL        500 mg 100 mL/hr over 60 Minutes Intravenous  Once 11/17/21 1611 11/17/21 2249   11/17/21 1615  piperacillin-tazobactam (ZOSYN) IVPB 3.375 g  Status:  Discontinued        3.375 g 12.5 mL/hr over 240 Minutes Intravenous Every 8 hours 11/17/21 1606 11/18/21 1001   11/17/21 1600  vancomycin (VANCOCIN) IVPB 1000 mg/200 mL premix  Status:  Discontinued        1,000 mg 200 mL/hr over 60 Minutes Intravenous  Once 11/17/21 1557 11/17/21 1559   11/17/21 1600  ceFEPIme (MAXIPIME) 2 g in sodium chloride 0.9 % 100 mL IVPB  Status:  Discontinued        2 g 200 mL/hr over 30 Minutes Intravenous  Once 11/17/21 1557 11/17/21 1559   11/17/21 1415  ceFEPIme (MAXIPIME) 2 g in sodium chloride 0.9 % 100 mL IVPB        2 g 200 mL/hr over 30 Minutes Intravenous  Once 11/17/21 1409 11/17/21 1512   11/17/21 1415  metroNIDAZOLE (FLAGYL) IVPB 500 mg        500 mg 100 mL/hr over 60 Minutes Intravenous  Once  11/17/21 1409 11/17/21 1623   11/17/21 1415  vancomycin (VANCOCIN) IVPB 1000 mg/200 mL premix        1,000 mg 200 mL/hr over 60 Minutes Intravenous  Once 11/17/21 1409 11/17/21 1625       Subjective: Seen and examined at bedside and he is not as somnolent and he is getting fed by the nurse tech.  Denies any pain at this time.  Felt okay.  No other concerns or complaints at this time.  Objective: Vitals:   11/22/21 2343  11/23/21 0054 11/23/21 0440 11/23/21 0822  BP: (!) 81/47 90/72 (!) 91/53 105/63  Pulse: (!) 52 95 83 77  Resp: 16 16 16 16   Temp: 97.9 F (36.6 C) 97.9 F (36.6 C) 98.9 F (37.2 C) 98.1 F (36.7 C)  TempSrc: Oral Oral Oral   SpO2: 100% 99% 94% 96%  Weight:      Height:        Intake/Output Summary (Last 24 hours) at 11/23/2021 1426 Last data filed at 11/23/2021 1030 Gross per 24 hour  Intake 1248 ml  Output 1200 ml  Net 48 ml    Filed Weights   11/17/21 1354  Weight: 68.6 kg   Examination: Physical Exam:  Constitutional: Thin chronically ill-appearing Caucasian male currently who is significantly contracted and has atrophy is awake and getting fed but slightly confused Eyes: Lids and conjunctivae normal, sclerae anicteric  ENMT: External Ears, Nose appear normal. Grossly normal hearing.  Neck: Appears normal, supple, no cervical masses, normal ROM, no appreciable thyromegaly; no appreciable JVD Respiratory: Diminished to auscultation bilaterally with coarse breath sounds, no wheezing, rales, rhonchi or crackles. Normal respiratory effort and patient is not tachypenic. No accessory muscle use.  Unlabored breathing Cardiovascular: RRR, no murmurs / rubs / gallops. S1 and S2 auscultated.  Abdomen: Soft, non-tender, non-distended. Bowel sounds positive.  GU: Deferred. Musculoskeletal: No clubbing / cyanosis of digits/nails. No joint deformity upper and lower extremities but right foot is wrapped Skin: No rashes, lesions, ulcers on limited skin evaluation  but right foot is bandaged and Ace wrap. No induration; Warm and dry.  Neurologic: CN 2-12 grossly intact with no focal deficits but has some tremors. Romberg sign and cerebellar reflexes not assessed.  Psychiatric: Impaired judgment and insight.  He is awake but not but is a little confused. Normal mood and appropriate affect.   Data Reviewed: I have personally reviewed following labs and imaging studies  CBC: Recent Labs  Lab 11/19/21 1045 11/20/21 0722 11/21/21 1002 11/22/21 0624 11/23/21 0605  WBC 12.9* 10.6* 10.6* 10.7* 12.1*  NEUTROABS 9.4* 7.4 7.6 7.6 9.0*  HGB 12.6* 13.8 12.6* 13.3 10.9*  HCT 37.9* 42.3 37.7* 40.0 33.0*  MCV 92.0 94.2 94.7 93.2 93.0  PLT 316 290 244 184 127    Basic Metabolic Panel: Recent Labs  Lab 11/19/21 1045 11/20/21 0722 11/21/21 0824 11/22/21 0624 11/23/21 0605  NA 138 139 137 137 138  K 3.4* 3.8 4.3 4.1 3.8  CL 103 103 104 103 104  CO2 29 30 26 26 29   GLUCOSE 103* 113* 82 88 122*  BUN 11 12 10 11 12   CREATININE 0.52* 0.66 0.45* 0.59* 0.71  CALCIUM 8.6* 8.5* 8.5* 8.6* 8.1*  MG 1.7 2.2 2.1 2.1 1.9  PHOS 1.9* 2.0* 2.3* 2.3* 1.7*    GFR: Estimated Creatinine Clearance: 84.6 mL/min (by C-G formula based on SCr of 0.71 mg/dL). Liver Function Tests: Recent Labs  Lab 11/19/21 1045 11/20/21 0722 11/21/21 0824 11/22/21 0624 11/23/21 0605  AST 170* 149* 106* 88* 68*  ALT 129* 73* 35 74* 113*  ALKPHOS 61 72 66 69 61  BILITOT 1.0 0.8 0.6 0.9 0.5  PROT 5.8* 6.3* 5.8* 6.3* 5.3*  ALBUMIN 2.5* 2.7* 2.5* 2.7* 2.2*    No results for input(s): LIPASE, AMYLASE in the last 168 hours. No results for input(s): AMMONIA in the last 168 hours. Coagulation Profile: Recent Labs  Lab 11/17/21 1406  INR 1.5*    Cardiac Enzymes: No results for input(s): CKTOTAL, CKMB, CKMBINDEX, TROPONINI  in the last 168 hours. BNP (last 3 results) No results for input(s): PROBNP in the last 8760 hours. HbA1C: No results for input(s): HGBA1C in the last 72  hours. CBG: Recent Labs  Lab 11/18/21 1849 11/18/21 2013  GLUCAP 86 96    Lipid Profile: No results for input(s): CHOL, HDL, LDLCALC, TRIG, CHOLHDL, LDLDIRECT in the last 72 hours. Thyroid Function Tests: Recent Labs    11/21/21 0824  TSH 4.105    Anemia Panel: No results for input(s): VITAMINB12, FOLATE, FERRITIN, TIBC, IRON, RETICCTPCT in the last 72 hours.  Sepsis Labs: Recent Labs  Lab 11/17/21 1406  LATICACIDVEN 1.3     Recent Results (from the past 240 hour(s))  Blood Culture (routine x 2)     Status: None   Collection Time: 11/17/21  2:06 PM   Specimen: BLOOD  Result Value Ref Range Status   Specimen Description BLOOD PICC LINE  Final   Special Requests BOTTLES DRAWN AEROBIC AND ANAEROBIC BCAV  Final   Culture   Final    NO GROWTH 5 DAYS Performed at [facility], [address] [place], [place] [address]    Report Status 11/22/2021 FINAL  Final  Resp Panel by RT-PCR (Flu A&B, Covid) Nasopharyngeal Swab     Status: None   Collection Time: 11/17/21  2:42 PM   Specimen: Nasopharyngeal Swab; Nasopharyngeal(NP) swabs in vial transport medium  Result Value Ref Range Status   SARS Coronavirus 2 by RT PCR NEGATIVE NEGATIVE Final    Comment: (NOTE) SARS-CoV-2 target nucleic acids are NOT DETECTED.  The SARS-CoV-2 RNA is generally detectable in upper respiratory specimens during the acute phase of infection. The lowest concentration of SARS-CoV-2 viral copies this assay can detect is 138 copies/mL. A negative result does not preclude SARS-Cov-2 infection and should not be used as the sole basis for treatment or other patient management decisions. A negative result may occur with  improper specimen collection/handling, submission of specimen other than nasopharyngeal swab, presence of viral mutation(s) within the areas targeted by this assay, and inadequate number of viral copies(<138 copies/mL). A negative result must be combined with clinical  observations, patient history, and epidemiological information. The expected result is Negative.  Fact Sheet for Patients:  EntrepreneurPulse.com.au  Fact Sheet for Healthcare Providers:  IncredibleEmployment.be  This test is no t yet approved or cleared by the [place] FDA and  has been authorized for detection and/or diagnosis of SARS-CoV-2 by FDA under an Emergency Use Authorization (EUA). This EUA will remain  in effect (meaning this test can be used) for the duration of the COVID-19 declaration under Section 564(b)(1) of the Act, 21 U.S.C.section 360bbb-3(b)(1), unless the authorization is terminated  or revoked sooner.       Influenza A by PCR NEGATIVE NEGATIVE Final   Influenza B by PCR NEGATIVE NEGATIVE Final    Comment: (NOTE) The Xpert Xpress SARS-CoV-2/FLU/RSV plus assay is intended as an aid in the diagnosis of influenza from Nasopharyngeal swab specimens and should not be used as a sole basis for treatment. Nasal washings and aspirates are unacceptable for Xpert Xpress SARS-CoV-2/FLU/RSV testing.  Fact Sheet for Patients: EntrepreneurPulse.com.au  Fact Sheet for Healthcare Providers: IncredibleEmployment.be  This test is not yet approved or cleared by the [place] FDA and has been authorized for detection and/or diagnosis of SARS-CoV-2 by FDA under an Emergency Use Authorization (EUA). This EUA will remain in effect (meaning this test can be used) for the duration of the COVID-19 declaration under  Section 564(b)(1) of the Act, 21 U.S.C. section 360bbb-3(b)(1), unless the authorization is terminated or revoked.  Performed at [facility], [address] [place], [place] [address]   Blood Culture (routine x 2)     Status: None   Collection Time: 11/17/21  2:42 PM   Specimen: BLOOD  Result Value Ref Range Status   Specimen Description BLOOD ARM  Final   Special  Requests BOTTLES DRAWN AEROBIC AND ANAEROBIC BCAV  Final   Culture   Final    NO GROWTH 5 DAYS Performed at [facility], [address], [place], [place] [address]    Report Status 11/22/2021 FINAL  Final  Aerobic/Anaerobic Culture w Gram Stain (surgical/deep wound)     Status: None   Collection Time: 11/17/21  6:33 PM   Specimen: Foot; Abscess  Result Value Ref Range Status   Specimen Description   Final    FOOT Performed at [facility], [address], [place], [place] [address]    Special Requests   Final    LEFT Performed at [facility], [address] [place], [place] [address]    Gram Stain   Final    MODERATE WBC PRESENT,BOTH PMN AND MONONUCLEAR MODERATE GRAM POSITIVE COCCI RARE GRAM NEGATIVE RODS    Culture   Final    FEW STAPHYLOCOCCUS AUREUS NO ANAEROBES ISOLATED Performed at [facility], [place] [address], [place], [place] [address]    Report Status 11/23/2021 FINAL  Final   Organism ID, Bacteria STAPHYLOCOCCUS AUREUS  Final      Susceptibility   Staphylococcus aureus - MIC*    CIPROFLOXACIN >=8 RESISTANT Resistant     ERYTHROMYCIN >=8 RESISTANT Resistant     GENTAMICIN <=0.5 SENSITIVE Sensitive     OXACILLIN 0.5 SENSITIVE Sensitive     TETRACYCLINE <=1 SENSITIVE Sensitive     VANCOMYCIN 1 SENSITIVE Sensitive     TRIMETH/SULFA <=10 SENSITIVE Sensitive     CLINDAMYCIN <=0.25 SENSITIVE Sensitive     RIFAMPIN <=0.5 SENSITIVE Sensitive     Inducible Clindamycin NEGATIVE Sensitive     * FEW STAPHYLOCOCCUS AUREUS  Aerobic/Anaerobic Culture w Gram Stain (surgical/deep wound)     Status: None   Collection Time: 11/18/21  3:06 PM   Specimen: PATH Other; Tissue  Result Value Ref Range Status   Specimen Description   Final    ABSCESS Performed at [facility], [address], [place], [place] [address]    Special Requests   Final    LEFT FOOT Performed at [facility], [address] [place], [place]  [address]    Gram Stain   Final    NO SQUAMOUS EPITHELIAL CELLS SEEN FEW WBC SEEN FEW GRAM POSITIVE COCCI    Culture   Final    FEW STAPHYLOCOCCUS AUREUS NO ANAEROBES ISOLATED Performed at [facility], [place] [address] [place],  [address]    Report Status 11/23/2021 FINAL  Final   Organism ID, Bacteria STAPHYLOCOCCUS AUREUS  Final      Susceptibility   Staphylococcus aureus - MIC*    CIPROFLOXACIN >=8 RESISTANT Resistant     ERYTHROMYCIN >=8 RESISTANT Resistant     GENTAMICIN <=0.5 SENSITIVE Sensitive     OXACILLIN 0.5 SENSITIVE Sensitive     TETRACYCLINE <=1 SENSITIVE Sensitive     VANCOMYCIN <=0.5 SENSITIVE Sensitive     TRIMETH/SULFA <=10 SENSITIVE Sensitive     CLINDAMYCIN <=0.25 SENSITIVE Sensitive     RIFAMPIN <=0.5  SENSITIVE Sensitive     Inducible Clindamycin NEGATIVE Sensitive     * FEW STAPHYLOCOCCUS AUREUS      RN Pressure Injury Documentation: Pressure Injury 11/18/21 Buttocks (Active)  11/18/21   Location: Buttocks  Location Orientation:   Staging:   Wound Description (Comments):   Present on Admission: Yes     Estimated body mass index is 20.52 kg/m as calculated from the following:   Height as of this encounter: 6' (1.829 m).   Weight as of this encounter: 68.6 kg.  Malnutrition Type: Nutrition Problem: Moderate Malnutrition Etiology: chronic illness (Parkinson's) Malnutrition Characteristics: Signs/Symptoms: mild fat depletion, mild muscle depletion, moderate muscle depletion Nutrition Interventions: Interventions: MVI, Hormel Shake Radiology Studies: No results found.  Scheduled Meds:  amiodarone  200 mg Oral Daily   vitamin C  500 mg Oral BID   carbidopa-levodopa  1 tablet Oral QID   Chlorhexidine Gluconate Cloth  6 each Topical Daily   donepezil  10 mg Oral Daily   enoxaparin (LOVENOX) injection  1 mg/kg Subcutaneous Q12H   levothyroxine  100 mcg Oral Q0600   metoprolol tartrate  12.5 mg Oral BID   midodrine  5 mg Oral TID WC    multivitamin with minerals  1 tablet Oral Daily   simvastatin  20 mg Oral QHS   sodium chloride flush  10-40 mL Intracatheter Q12H   Vitamin D (Ergocalciferol)  50,000 Units Oral Q7 days   zinc sulfate  220 mg Oral Daily   Continuous Infusions:  sodium chloride 10 mL/hr at 11/22/21 0615    ceFAZolin (ANCEF) IV 2 g (11/23/21 1416)   potassium PHOSPHATE IVPB (in mmol) 30 mmol (11/23/21 1307)    LOS: 6 days   Kerney Elbe, DO [facility] PAGER is on AMION  If 7PM-7AM, please contact night-coverage [email or web address]

## 2021-11-23 NOTE — Consult Note (Signed)
Consultation Note Date: 11/23/2021   Patient Name: Douglas Silva.  DOB: [birth year]  MRN: [id number]  Age / Sex: 71 y.o., male  PCP: Remi Haggard, FNP Referring Physician: Kerney Elbe, DO  Reason for Consultation: Establishing goals of care  HPI/Patient Profile: 71 y.o. male  with past medical history of Parkinson's disease, hypothyroidism, atrial fibrillation, CAD, cardiomyopathy, hypertension, BPH admitted on 11/17/2021 with worsening left foot wound with purulent drainage found to have osteomyelitis and abscess. 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 Per outpatient palliative care note 11/16/21 with [facility] there were plans to initiate hospice services at facility but does not appear that hospice services had yet began.   Clinical Assessment and Goals of Care: I met today with Coltan but no family present. He speaks clearly although with limited words although responses are appropriate. I reviewed with him briefly his underlying Parkinson's disease and declining functional status leading to complications such as skin breakdown and wounds. Quintan seemed anxious and frustrated with conversation with worsening tremor and terse responses. I tried to keep conversation brief. I clarified with Brecken his wishes for CPR, shock, breathing tube/machine and life support if his health were to worsen and he was very clear that he would not want these efforts. He verified this with me again that he would never want resuscitative measures. He did not seem aware of any plans for hospice or focus of comfort care at facility and seems onboard for current level of care. He gives me permission to speak with his sister, [name].   I called and spoke with sister/HCPOA, [name]. [name] shares that it is just she and Avier and they are twins. She shares that they have not had any  conversations regarding his wishes and as a family growing up these were not things that they talked about as a family and they did not even show their emotions often. I explained to [name] my conversation with [name] and she was reassured knowing that he would not want resuscitation as she had previously made him DNR and she is pleased that she made the decision that correlates with his wishes. She continues to be interested in hospice services at the facility and we discussed that there may be a recommendation for antibiotic treatment as hospice will not do IV antibiotics. If hospice services has to be delayed to receive treatment [name] is okay with this. Most important to [name] is that the interventions and what we ask [name] to go through has benefits over the risks and pains of putting him through things. She wants him to be comfortable but wants treatment if it is felt to be helpful to him. It is also important for him to remain at [facility] as she says this is the most happy he has been in this facility.   All questions/concerns addressed. Emotional support provided.   Primary Decision Maker PATIENT (he seems to have fluctuating capacity); Surrogate decision maker is sister [name]  SUMMARY OF RECOMMENDATIONS   - DNR decided - Return to [facility] with palliative vs hospice as indicated  Code Status/Advance Care Planning: DNR   Symptom Management:  Per attending and surgery.   Palliative Prophylaxis:  Aspiration, Bowel Regimen, Delirium Protocol, Frequent Pain Assessment, Oral Care, Palliative Wound Care, and Turn Reposition  Prognosis:  Overall prognosis poor with progressive functional decline with underlying Parkinson's disease.   Discharge Planning: Return to nursing facility with palliative vs hospice.      Primary Diagnoses: Present on Admission:  Acute hematogenous osteomyelitis of right foot ([place])   I have reviewed the medical record, interviewed the  patient and family, and examined the patient. The following aspects are pertinent.  Past Medical History:  Diagnosis Date   Atrial fibrillation (HCC)    BPH (benign prostatic hyperplasia)    Cardiomyopathy ([place])    Coronary artery disease    Hypertension    Hypothyroidism    Parkinson's disease ([place])    Social History   Socioeconomic History   Marital status: Divorced    Spouse name: Not on file   Number of children: Not on file   Years of education: Not on file   Highest education level: Not on file  Occupational History   Not on file  Tobacco Use   Smoking status: Never   Smokeless tobacco: Never  Vaping Use   Vaping Use: Never used  Substance and Sexual Activity   Alcohol use: No   Drug use: No   Sexual activity: Not on file  Other Topics Concern   Not on file  Social History Narrative   Not on file   Social Determinants of Health   Financial Resource Strain: Not on file  Food Insecurity: Not on file  Transportation Needs: Not on file  Physical Activity: Not on file  Stress: Not on file  Social Connections: Not on file   Family History  Problem Relation Age of Onset   Lung cancer Mother    Esophageal cancer Father    Scheduled Meds:  amiodarone  200 mg Oral Daily   vitamin C  500 mg Oral BID   carbidopa-levodopa  1 tablet Oral QID   Chlorhexidine Gluconate Cloth  6 each Topical Daily   donepezil  10 mg Oral Daily   levothyroxine  100 mcg Oral Q0600   metoprolol tartrate  12.5 mg Oral BID   midodrine  5 mg Oral TID WC   multivitamin with minerals  1 tablet Oral Daily   simvastatin  20 mg Oral QHS   sodium chloride flush  10-40 mL Intracatheter Q12H   Vitamin D (Ergocalciferol)  50,000 Units Oral Q7 days   zinc sulfate  220 mg Oral Daily   Continuous Infusions:  sodium chloride 10 mL/hr at 11/22/21 0615    ceFAZolin (ANCEF) IV 2 g (11/22/21 1631)   heparin 1,250 Units/hr (11/23/21 0725)   potassium PHOSPHATE IVPB (in mmol)     PRN Meds:.sodium  chloride, acetaminophen **OR** acetaminophen, HYDROcodone-acetaminophen, magnesium hydroxide, ondansetron **OR** ondansetron (ZOFRAN) IV, sodium chloride flush, traZODone Allergies  Allergen Reactions   No Known Allergies    Review of Systems  Unable to perform ROS: Acuity of condition  Constitutional:        Difficult for [place] to speak and express himself; he does deny pain   Physical Exam Vitals and nursing note reviewed.  Constitutional:      General: He is not in acute distress.    Appearance: He is ill-appearing.  Comments: Frail  Cardiovascular:     Rate and Rhythm: Normal rate.  Pulmonary:     Effort: No tachypnea, accessory muscle usage or respiratory distress.  Abdominal:     General: Abdomen is flat.  Neurological:     Mental Status: He is alert.     Comments: He seems mostly oriented with appropriate responses but becomes easily frustrated with difficulty expressing himself or when others struggle to understand him    Vital Signs: BP 105/63 (BP Location: Left Arm)    Pulse 77    Temp 98.1 F (36.7 C)    Resp 16    Ht 6' (1.829 m)    Wt 68.6 kg    SpO2 96%    BMI 20.52 kg/m  Pain Scale: 0-10   Pain Score: 0-No pain   SpO2: SpO2: 96 % O2 Device:SpO2: 96 % O2 Flow Rate: .   IO: Intake/output summary:  Intake/Output Summary (Last 24 hours) at 11/23/2021 0912 Last data filed at 11/23/2021 0034 Gross per 24 hour  Intake 1288 ml  Output 1215 ml  Net 73 ml    LBM: Last BM Date: 11/20/21 Baseline Weight: Weight: 68.6 kg Most recent weight: Weight: 68.6 kg     Palliative Assessment/Data:     Time Total: 60 min  Greater than 50%  of this time was spent counseling and coordinating care related to the above assessment and plan.  Signed by: Vinie Sill, NP Palliative Medicine Team Pager # [phone] (M-F 8a-5p) Team Phone # [phone] (Nights/Weekends)

## 2021-11-23 NOTE — Consult Note (Signed)
ANTICOAGULATION CONSULT NOTE  Pharmacy Consult for heparin infusion Indication: hx of afib, on xarelto PTA  Allergies  Allergen Reactions   No Known Allergies     Patient Measurements: Height: 6' (182.9 cm) Weight: 68.6 kg (151 lb 4.8 oz) IBW/kg (Calculated) : 77.6 Heparin Dosing Weight: 68.6 kg  Vital Signs: Temp: 98.9 F (37.2 C) (01/30 0440) Temp Source: Oral (01/30 0440) BP: 91/53 (01/30 0440) Pulse Rate: 83 (01/30 0440)  Labs: Recent Labs    11/21/21 0824 11/21/21 1002 11/21/21 1541 11/21/21 2305 11/22/21 0624 11/23/21 0605  HGB  --  12.6*  --   --  13.3 10.9*  HCT  --  37.7*  --   --  40.0 33.0*  PLT  --  244  --   --  184 233  APTT  --   --  31  --   --   --   HEPARINUNFRC  --   --   --  0.35  --  0.22*  CREATININE 0.45*  --   --   --  0.59* 0.71     Estimated Creatinine Clearance: 84.6 mL/min (by C-G formula based on SCr of 0.71 mg/dL).   Medical History: Past Medical History:  Diagnosis Date   Atrial fibrillation (HCC)    BPH (benign prostatic hyperplasia)    Cardiomyopathy (HCC)    Coronary artery disease    Hypertension    Hypothyroidism    Parkinson's disease ([place])     Medications:  PTA: xarelto 20 mg daily -- last dose 11/16/21  Assessment: 71 y.o. with a history of Afib on Xarelto PTA presented to [facility] on 11/17/21 with worsening foot wound. Found to have acute osteomyelitis. S/p 5th ray excision on 11/18/21. Xarelto has been held during admission for surgical interventions. Pharmacy consulted to initiate IV heparin therapy.  Goal of Therapy:  Heparin level 0.3-0.7 units/ml Monitor platelets by anticoagulation protocol: Yes  1/28 2305 HL 0.35, therapeutic x 1 1/29 0300 Heparin stopped for I&D 1/30 0605 HL 0.22, subtherapeutic     Plan:  Heparin bolus x 1000 units Increase heparin infusion to 1250 units/hr Recheck HL in 6 hr after rate change. CBC daily F/u long term AC plans  Renda Rolls, PharmD, [facility] 11/23/2021 6:46  AM

## 2021-11-23 NOTE — Progress Notes (Signed)
° °  Date of Admission:  11/17/2021    ID: Douglas Silva. is a 71 y.o. male Principal Problem:   Acute hematogenous osteomyelitis of right foot ([place]) Active Problems:   Malnutrition of moderate degree    Subjective: Pt says he is feeling okay No pain left foot {T seen with Dr.Cline  Medications:   amiodarone  200 mg Oral Daily   vitamin C  500 mg Oral BID   carbidopa-levodopa  1 tablet Oral QID   Chlorhexidine Gluconate Cloth  6 each Topical Daily   donepezil  10 mg Oral Daily   levothyroxine  100 mcg Oral Q0600   metoprolol tartrate  12.5 mg Oral BID   midodrine  5 mg Oral TID WC   multivitamin with minerals  1 tablet Oral Daily   simvastatin  20 mg Oral QHS   sodium chloride flush  10-40 mL Intracatheter Q12H   Vitamin D (Ergocalciferol)  50,000 Units Oral Q7 days   zinc sulfate  220 mg Oral Daily    Objective: Vital signs in last 24 hours: Temp:  [97.7 F (36.5 C)-98.9 F (37.2 C)] 98.1 F (36.7 C) (01/30 0822) Pulse Rate:  [52-95] 77 (01/30 0822) Resp:  [15-17] 16 (01/30 0822) BP: (81-124)/(47-84) 105/63 (01/30 0822) SpO2:  [94 %-100 %] 96 % (01/30 0822)  PHYSICAL EXAM:  General: awake   no distress at rest, no carotid bruit and no JVD. Lungs: b/l air entry. Heart: irregular Abdomen: Soft, non-tender,not distended. Bowel sounds normal. No masses Extremities:  11/23/21   Neurologic: flexion contractures Involuntary tremors  Lab Results Recent Labs    11/22/21 0624 11/23/21 0605  WBC 10.7* 12.1*  HGB 13.3 10.9*  HCT 40.0 33.0*  NA 137 138  K 4.1 3.8  CL 103 104  CO2 26 29  BUN 11 12  CREATININE 0.59* 0.71   Liver Panel Recent Labs    11/22/21 0624 11/23/21 0605  PROT 6.3* 5.3*  ALBUMIN 2.7* 2.2*  AST 88* 68*  ALT 74* 113*  ALKPHOS 69 61  BILITOT 0.9 0.5    Microbiology: MSSA wound culture Pathology 5th ray excision The proximal bone margin is negative for active inflammation.    Assessment/Plan: Left  foot infection -ulcer  lateral aspect with ostemyelitis of the 5 th toe- s/p 5th ray amputation- MSSA in cultures- currently on cefazolin, was taken to the OR on 1/28 for further washout Pathology- proximal margin clear of osteo On discharge can be switched to augmentin for 2 weeks   ?Parkinsons disease with severe lead pipe rigidity and also severe involuntary tremors when touched or turned. On sinemet   Autonomic dysfunction   Severe dysphonia   Afib on amiodarone    Palliative on board_ Pt seen with Dr.Cline ID will sign off -call if needed

## 2021-11-23 NOTE — Progress Notes (Signed)
1 Day Post-Op   Subjective/Chief Complaint: Patient seen.  No complaints of any pain.   Objective: Vital signs in last 24 hours: Temp:  [97.7 F (36.5 C)-98.9 F (37.2 C)] 98.1 F (36.7 C) (01/30 0822) Pulse Rate:  [52-95] 77 (01/30 0822) Resp:  [15-17] 16 (01/30 0822) BP: (81-124)/(47-84) 105/63 (01/30 0822) SpO2:  [94 %-100 %] 96 % (01/30 0822) Last BM Date: 11/20/21  Intake/Output from previous day: 01/29 0701 - 01/30 0700 In: 1288 [P.O.:600; I.V.:188; IV Piggyback:500] Out: 0272 [Urine:1200; Blood:15] Intake/Output this shift: Total I/O In: 300 [P.O.:300] Out: -   Bandage on the left foot is dry and intact.  Upon removal there is some mild to moderate bleeding on the bandaging.  No signs of purulence.  Incision appears well coapted with minimal erythema.     Lab Results:  Recent Labs    11/22/21 0624 11/23/21 0605  WBC 10.7* 12.1*  HGB 13.3 10.9*  HCT 40.0 33.0*  PLT 184 233   BMET Recent Labs    11/22/21 0624 11/23/21 0605  NA 137 138  K 4.1 3.8  CL 103 104  CO2 26 29  GLUCOSE 88 122*  BUN 11 12  CREATININE 0.59* 0.71  CALCIUM 8.6* 8.1*   PT/INR No results for input(s): LABPROT, INR in the last 72 hours. ABG No results for input(s): PHART, HCO3 in the last 72 hours.  Invalid input(s): PCO2, PO2  Studies/Results: No results found.  Anti-infectives: Anti-infectives (From admission, onward)    Start     Dose/Rate Route Frequency Ordered Stop   11/20/21 1800  ceFAZolin (ANCEF) IVPB 2g/100 mL premix        2 g 200 mL/hr over 30 Minutes Intravenous Every 8 hours 11/20/21 1547     11/18/21 1400  ceFEPIme (MAXIPIME) 2 g in sodium chloride 0.9 % 100 mL IVPB  Status:  Discontinued        2 g 200 mL/hr over 30 Minutes Intravenous Every 8 hours 11/18/21 1001 11/20/21 1547   11/18/21 0500  vancomycin (VANCOREADY) IVPB 1500 mg/300 mL  Status:  Discontinued        1,500 mg 150 mL/hr over 120 Minutes Intravenous Every 12 hours 11/17/21 1611 11/17/21  1615   11/18/21 0500  vancomycin (VANCOCIN) IVPB 1000 mg/200 mL premix  Status:  Discontinued        1,000 mg 200 mL/hr over 60 Minutes Intravenous Every 12 hours 11/17/21 1615 11/20/21 1547   11/17/21 1800  piperacillin-tazobactam (ZOSYN) IVPB 3.375 g  Status:  Discontinued        3.375 g 100 mL/hr over 30 Minutes Intravenous Every 6 hours 11/17/21 1559 11/17/21 1604   11/17/21 1700  vancomycin (VANCOREADY) IVPB 500 mg/100 mL        500 mg 100 mL/hr over 60 Minutes Intravenous  Once 11/17/21 1611 11/17/21 2249   11/17/21 1615  piperacillin-tazobactam (ZOSYN) IVPB 3.375 g  Status:  Discontinued        3.375 g 12.5 mL/hr over 240 Minutes Intravenous Every 8 hours 11/17/21 1606 11/18/21 1001   11/17/21 1600  vancomycin (VANCOCIN) IVPB 1000 mg/200 mL premix  Status:  Discontinued        1,000 mg 200 mL/hr over 60 Minutes Intravenous  Once 11/17/21 1557 11/17/21 1559   11/17/21 1600  ceFEPIme (MAXIPIME) 2 g in sodium chloride 0.9 % 100 mL IVPB  Status:  Discontinued        2 g 200 mL/hr over 30 Minutes Intravenous  Once 11/17/21  1557 11/17/21 1559   11/17/21 1415  ceFEPIme (MAXIPIME) 2 g in sodium chloride 0.9 % 100 mL IVPB        2 g 200 mL/hr over 30 Minutes Intravenous  Once 11/17/21 1409 11/17/21 1512   11/17/21 1415  metroNIDAZOLE (FLAGYL) IVPB 500 mg        500 mg 100 mL/hr over 60 Minutes Intravenous  Once 11/17/21 1409 11/17/21 1623   11/17/21 1415  vancomycin (VANCOCIN) IVPB 1000 mg/200 mL premix        1,000 mg 200 mL/hr over 60 Minutes Intravenous  Once 11/17/21 1409 11/17/21 1625       Assessment/Plan: s/p Procedure(s): IRRIGATION AND DEBRIDEMENT FOOT (Left) Assessment: Stable status post repeat I&D left foot.  Plan: Betadine and sterile dressing reapplied to the left foot.  Patient should be stable for discharge from podiatry standpoint pending appropriate antibiotics from infectious disease who viewed the patient in conjunction with myself today.  May need dressing  changes every 2 to 3 days for removal of drainage and reassessment.  Plan for follow-up in a couple of weeks for reevaluation.  LOS: 6 days    [name] 11/23/2021

## 2021-11-24 DIAGNOSIS — M86071 Acute hematogenous osteomyelitis, right ankle and foot: Secondary | ICD-10-CM | POA: Diagnosis not present

## 2021-11-24 DIAGNOSIS — E440 Moderate protein-calorie malnutrition: Secondary | ICD-10-CM | POA: Diagnosis not present

## 2021-11-24 LAB — COMPREHENSIVE METABOLIC PANEL
ALT: 19 U/L (ref 0–44)
AST: 56 U/L — ABNORMAL HIGH (ref 15–41)
Albumin: 2.4 g/dL — ABNORMAL LOW (ref 3.5–5.0)
Alkaline Phosphatase: 62 U/L (ref 38–126)
Anion gap: 4 — ABNORMAL LOW (ref 5–15)
BUN: 9 mg/dL (ref 8–23)
CO2: 29 mmol/L (ref 22–32)
Calcium: 8.2 mg/dL — ABNORMAL LOW (ref 8.9–10.3)
Chloride: 104 mmol/L (ref 98–111)
Creatinine, Ser: 0.53 mg/dL — ABNORMAL LOW (ref 0.61–1.24)
GFR, Estimated: >60 mL/min (ref >60)
Glucose, Bld: 90 mg/dL (ref 70–99)
Potassium: 3.7 mmol/L (ref 3.5–5.1)
Sodium: 137 mmol/L (ref 135–145)
Total Bilirubin: 0.5 mg/dL (ref 0.3–1.2)
Total Protein: 5.7 g/dL — ABNORMAL LOW (ref 6.5–8.1)

## 2021-11-24 LAB — PHOSPHORUS: Phosphorus: 2.2 mg/dL — ABNORMAL LOW (ref 2.5–4.6)

## 2021-11-24 LAB — RESP PANEL BY RT-PCR (FLU A&B, COVID) ARPGX2
Influenza A by PCR: NEGATIVE
Influenza B by PCR: NEGATIVE
SARS Coronavirus 2 by RT PCR: NEGATIVE

## 2021-11-24 LAB — CBC WITH DIFFERENTIAL/PLATELET
Abs Immature Granulocytes: 0.04 10*3/uL (ref 0.00–0.07)
Basophils Absolute: 0 10*3/uL (ref 0.0–0.1)
Basophils Relative: 0 %
Eosinophils Absolute: 0.2 10*3/uL (ref 0.0–0.5)
Eosinophils Relative: 2 %
HCT: 35.6 % — ABNORMAL LOW (ref 39.0–52.0)
Hemoglobin: 11.7 g/dL — ABNORMAL LOW (ref 13.0–17.0)
Immature Granulocytes: 0 %
Lymphocytes Relative: 24 %
Lymphs Abs: 2.9 10*3/uL (ref 0.7–4.0)
MCH: 31 pg (ref 26.0–34.0)
MCHC: 32.9 g/dL (ref 30.0–36.0)
MCV: 94.4 fL (ref 80.0–100.0)
Monocytes Absolute: 1.2 10*3/uL — ABNORMAL HIGH (ref 0.1–1.0)
Monocytes Relative: 10 %
Neutro Abs: 7.6 10*3/uL (ref 1.7–7.7)
Neutrophils Relative %: 64 %
Platelets: 240 10*3/uL (ref 150–400)
RBC: 3.77 MIL/uL — ABNORMAL LOW (ref 4.22–5.81)
RDW: 15 % (ref 11.5–15.5)
WBC: 12 10*3/uL — ABNORMAL HIGH (ref 4.0–10.5)
nRBC: 0 % (ref 0.0–0.2)

## 2021-11-24 LAB — MAGNESIUM: Magnesium: 2 mg/dL (ref 1.7–2.4)

## 2021-11-24 MED ORDER — HYDROCODONE-ACETAMINOPHEN 5-325 MG PO TABS: 1.0000 | ORAL_TABLET | Freq: Three times a day (TID) | ORAL | 0 refills | Status: AC | PRN

## 2021-11-24 MED ORDER — ACETAMINOPHEN 325 MG PO TABS: 650.0000 mg | ORAL_TABLET | Freq: Four times a day (QID) | ORAL | Status: AC | PRN

## 2021-11-24 MED ORDER — AMOXICILLIN-POT CLAVULANATE 875-125 MG PO TABS: 1.0000 | ORAL_TABLET | Freq: Two times a day (BID) | ORAL | 0 refills | Status: AC

## 2021-11-24 MED ORDER — ZINC SULFATE 220 (50 ZN) MG PO CAPS: 220.0000 mg | ORAL_CAPSULE | Freq: Every day | ORAL | Status: AC

## 2021-11-24 MED ORDER — AMIODARONE HCL 200 MG PO TABS: 200.0000 mg | ORAL_TABLET | Freq: Every day | ORAL | Status: AC

## 2021-11-24 MED ORDER — ASCORBIC ACID 500 MG PO TABS: 500.0000 mg | ORAL_TABLET | Freq: Two times a day (BID) | ORAL | Status: AC

## 2021-11-24 MED ORDER — ONDANSETRON HCL 4 MG PO TABS: 4.0000 mg | ORAL_TABLET | Freq: Four times a day (QID) | ORAL | 0 refills | Status: AC | PRN

## 2021-11-24 MED ORDER — AMOXICILLIN-POT CLAVULANATE 875-125 MG PO TABS
1.0000 | ORAL_TABLET | Freq: Two times a day (BID) | ORAL | Status: DC
  Administered 2021-11-24: 1 via ORAL
  Filled 2021-11-24: qty 1

## 2021-11-24 MED ORDER — ADULT MULTIVITAMIN W/MINERALS CH: 1.0000 | ORAL_TABLET | Freq: Every day | ORAL | Status: AC

## 2021-11-24 NOTE — Progress Notes (Signed)
Nutrition Follow-up  DOCUMENTATION CODES:   Non-severe (moderate) malnutrition in context of chronic illness  INTERVENTION:   -Continue Mighty Shake TID with meals, each supplement provides 220 kcals and 9 grams protein -Continue MVI with minerals daily -Continue 500 mg Vitamin C BID -Continue 220 mg zinc sulfate daily x 14 days -Continue feeding assistance with meals   NUTRITION DIAGNOSIS:   Moderate Malnutrition related to chronic illness (Parkinson's) as evidenced by mild fat depletion, mild muscle depletion, moderate muscle depletion.  Ongoing  GOAL:   Patient will meet greater than or equal to 90% of their needs  Progressing   MONITOR:   PO intake, Supplement acceptance, Labs, Weight trends, Skin, I & O's  REASON FOR ASSESSMENT:   Low [place]    ASSESSMENT:   Douglas Silva. is a 71 y.o. Caucasian male with medical history significant for atrial fibrillation, BPH, CAD, hypothyroidism, cardiomyopathy, BPH and Parkinson's, who presented to the emergency room from [facility] with left foot worsening wound despite chronic antibiotics at the facility with inability to care for the wound anymore.  No reported fever or chills.  No reported nausea or vomiting or abdominal pain.  No reported chest pain or dyspnea or cough or wheezing or hemoptysis.  No reported urinary symptoms.  1/25- s/p Left fifth ray amputation 1/29- s/p Procedure: Repeat I&D left foot.  Reviewed I/O's: -1.1 L x 24 hours and -4.1 L since admission  UOP: 1.9 L x 24 hours  Pt sitting up in bed bed, being fed breakfast by nurse tech. Per nurse tech, pt has a good appetite. Observed pt consuming bacon without difficulty. Pt in on a regular diet. Noted meal completions 50-100%.    Per MD notes, plan to return to SNF once antibiotic regimen has been decided. Palliative care following for goals of care; plan to d/c back to SNF with palliative vs hospice.   Medications reviewed and include  vitamin C, sinemet, vitamin D, and zinc sulfate.   Labs reviewed.   Diet Order:   Diet Order             Diet regular Room service appropriate? Yes; Fluid consistency: Thin  Diet effective now                   EDUCATION NEEDS:   Education needs have been addressed  Skin:  Skin Assessment: Skin Integrity Issues: Skin Integrity Issues:: Incisions, Other (Comment) Incisions: closed lt foot Other: pressure injury to buttocks  Last BM:  11/23/21  Height:   Ht Readings from Last 1 Encounters:  11/17/21 6' (1.829 m)    Weight:   Wt Readings from Last 1 Encounters:  11/17/21 68.6 kg    Ideal Body Weight:  80.9 kg  BMI:  Body mass index is 20.52 kg/m.  Estimated Nutritional Needs:   Kcal:  2050-2250  Protein:  120-135 grams  Fluid:  > 2 L    Loistine Chance, RD, LDN, [place] Registered Dietitian II Certified Diabetes Care and Education Specialist Please refer to [facility] for RD and/or RD on-call/weekend/after hours pager

## 2021-11-24 NOTE — NC FL2 (Signed)
[place] LEVEL OF CARE SCREENING TOOL     IDENTIFICATION  Patient Name: Douglas Silva. Birthdate: 1951/04/03 Sex: male Admission Date (Current Location): 11/17/2021  [facility] and [place] Number:  Engineering geologist and Address:  [facility], [address], [place], [place] [address]      Provider Number: [id number]  Attending Physician Name and Address:  Kerney Elbe, DO  Relative Name and Phone Number:  [name] [phone]    Current Level of Care: Hospital Recommended Level of Care: Nursing Facility Prior Approval Number:    Date Approved/Denied:   PASRR Number:    Discharge Plan: Other (Comment) (nursing facility)    Current Diagnoses: Patient Active Problem List   Diagnosis Date Noted   Malnutrition of moderate degree 11/19/2021   Acute hematogenous osteomyelitis of right foot ([place]) 11/17/2021   Syncope 04/05/2020   Atrial fibrillation with rapid ventricular response (HCC)    Parkinson disease ([place])    Syncope and collapse 04/03/2020   Orthostatic hypotension 10/12/2018   Hypotension 10/11/2018   Sepsis ([place]) 06/01/2018   AKI (acute kidney injury) ([place]) 09/03/2017   Cellulitis 06/01/2017    Orientation RESPIRATION BLADDER Height & Weight     Self, Situation, Place  Normal Continent, External catheter Weight: 68.6 kg Height:  6' (182.9 cm)  BEHAVIORAL SYMPTOMS/MOOD NEUROLOGICAL BOWEL NUTRITION STATUS      Continent Diet (regular)  AMBULATORY STATUS COMMUNICATION OF NEEDS Skin   Extensive Assist Verbally Normal                       Personal Care Assistance Level of Assistance  Total care       Total Care Assistance: Maximum assistance   Functional Limitations Info  Speech     Speech Info: Impaired    SPECIAL CARE FACTORS FREQUENCY                       Contractures Contractures Info: Not present    Additional Factors Info  Code Status, Allergies Code Status Info:  DNR Allergies Info: NKDA           Current Medications (11/24/2021):  This is the current hospital active medication list Current Facility-Administered Medications  Medication Dose Route Frequency Provider Last Rate Last Admin   0.9 %  sodium chloride infusion   Intravenous PRN Sharlotte Alamo, DPM 10 mL/hr at 11/22/21 0615 Restarted at 11/22/21 0948   acetaminophen (TYLENOL) tablet 650 mg  650 mg Oral Q6H PRN Sharlotte Alamo, DPM   650 mg at 11/21/21 1738   Or   acetaminophen (TYLENOL) suppository 650 mg  650 mg Rectal Q6H PRN Sharlotte Alamo, DPM       amiodarone (PACERONE) tablet 200 mg  200 mg Oral Daily Sharlotte Alamo, DPM   200 mg at 11/24/21 1002   ascorbic acid (VITAMIN C) tablet 500 mg  500 mg Oral BID Sharlotte Alamo, DPM   500 mg at 11/24/21 6063   carbidopa-levodopa (SINEMET IR) 25-100 MG per tablet immediate release 1 tablet  1 tablet Oral QID Sharlotte Alamo, DPM   1 tablet at 11/24/21 0958   ceFAZolin (ANCEF) IVPB 2g/100 mL premix  2 g Intravenous Q8H Sharlotte Alamo, DPM 200 mL/hr at 11/24/21 0503 2 g at 11/24/21 0503   Chlorhexidine Gluconate Cloth 2 % PADS 6 each  6 each Topical Daily Sharlotte Alamo, DPM   6 each at 11/24/21 1003   donepezil (ARICEPT) tablet  10 mg  10 mg Oral Daily Sharlotte Alamo, DPM   10 mg at 11/24/21 1000   enoxaparin (LOVENOX) injection 67.5 mg  1 mg/kg Subcutaneous Q12H Raiford Noble [place], DO   67.5 mg at 11/24/21 9794   HYDROcodone-acetaminophen (NORCO/VICODIN) 5-325 MG per tablet 1 tablet  1 tablet Oral Q4H PRN Sharlotte Alamo, DPM   1 tablet at 11/23/21 2350   levothyroxine (SYNTHROID) tablet 100 mcg  100 mcg Oral Q0600 Sharlotte Alamo, DPM   100 mcg at 11/24/21 0504   magnesium hydroxide (MILK OF MAGNESIA) suspension 30 mL  30 mL Oral Daily PRN Sharlotte Alamo, DPM       metoprolol tartrate (LOPRESSOR) tablet 12.5 mg  12.5 mg Oral BID Sharlotte Alamo, DPM   12.5 mg at 11/23/21 2350   midodrine (PROAMATINE) tablet 5 mg  5 mg Oral TID WC Sharlotte Alamo, DPM   5 mg at 11/24/21 0957   multivitamin  with minerals tablet 1 tablet  1 tablet Oral Daily Sharlotte Alamo, DPM   1 tablet at 11/24/21 0958   ondansetron (ZOFRAN) tablet 4 mg  4 mg Oral Q6H PRN Sharlotte Alamo, DPM       Or   ondansetron [facility]) injection 4 mg  4 mg Intravenous Q6H PRN Sharlotte Alamo, DPM       simvastatin (ZOCOR) tablet 20 mg  20 mg Oral QHS Sharlotte Alamo, DPM   20 mg at 11/23/21 2350   sodium chloride flush (NS) 0.9 % injection 10-40 mL  10-40 mL Intracatheter Q12H Sharlotte Alamo, DPM   20 mL at 11/24/21 1004   sodium chloride flush (NS) 0.9 % injection 10-40 mL  10-40 mL Intracatheter PRN Sharlotte Alamo, DPM       traZODone (DESYREL) tablet 25 mg  25 mg Oral QHS PRN Sharlotte Alamo, DPM       Vitamin D (Ergocalciferol) (DRISDOL) capsule 50,000 Units  50,000 Units Oral Q7 days Sharlotte Alamo, DPM       zinc sulfate capsule 220 mg  220 mg Oral Daily Sharlotte Alamo, DPM   220 mg at 11/24/21 8016     Discharge Medications: Please see discharge summary for a list of discharge medications.  Relevant Imaging Results:  Relevant Lab Results:   Additional Information PV#[id number]  Conception Oms, RN

## 2021-11-24 NOTE — TOC Progression Note (Signed)
Transition of Care (TOC) - Progression Note    Patient Details  Name: Douglas Silva. MRN: [id number] Date of Birth: [birth year]  Transition of Care [facility]) CM/SW [place], RN Phone Number: 11/24/2021, 12:26 PM  Clinical Narrative:   The patient will return to [facility] today to room 46B, will need Transport thru EMS, Spoke with [name] to make her aware         Expected Discharge Plan and Services                                                 Social Determinants of Health (SDOH) Interventions    Readmission Risk Interventions No flowsheet data found.

## 2021-11-24 NOTE — Discharge Summary (Signed)
Physician Discharge Summary  Douglas Silva. ZOX:[id number] DOB: 09-Aug-1951 DOA: 11/17/2021  PCP: Remi Haggard, FNP  Admit date: 11/17/2021 Discharge date: 11/24/2021  Admitted From: SNF Disposition: SNF  Recommendations for Outpatient Follow-up:  Follow up with PCP in 1-2 weeks Follow up with Podiatry within  Follow up with Palliative Care at D/C Follow up with ID as Necessary  Please obtain CMP/CBC, Mag, Phos in one week Please follow up on the following pending results:  Home Health: No  Equipment/Devices: None  Discharge Condition: Stable  CODE STATUS: FULL CODE  Diet recommendation: Regular Diet with Thin Liquids  Brief/Interim Summary: The patient is a 71 year old thin chronically ill-appearing Caucasian male with a past medical history significant for but not limited to atrial fibrillation, BPH, CAD, hypothyroidism, history of cardiomyopathy, history of Parkinson's disease as well as other comorbidities who presented to [facility] with left foot worsening despite chronic antibiotics at the facility with inability to take care of the wound.  He had no reported chills or fevers and no nausea or vomiting.  When he came to the ED blood pressure 97/77 temperature 97.2.  Labs were reviewed and he had a hypokalemia and elevated LFTs.  CBC showed an elevated WBC of 10.7 with anemia.  Imaging of the left foot showed soft tissue infection and wound of the lateral aspect of the foot with underlying acute osteomyelitis involving the distal fifth metatarsal at the fifth proximal phalanx.  He was initially given IV cefepime, IV vancomycin and Flagyl and admitted to a medical bed for further management and podiatry was consulted.  Podiatry felt the patient would benefit from amputation and took the patient to the OR on 11/18/2021 for Fifth ray amputation with deep wound culture given his osteomyelitis.   ID has been consulted for further antibiotic management as he has  MSSA in his culture and given changes [place] cefepime to cefazolin.  They will Decide on the duration and route once we have the pathology and also assess the wound with the podiatrist.  Palliative care has been consulted for further goals of care discussion and will see the patient on Monday, 11/23/2021.  Will await further cultures for ID to evaluate and make recommendations.  Podiatry took the patient for a repeat I&D of the foot to flush out any remaining infection on 11/22/21.  The wound was reevaluated by the podiatrist and ID Dr. Merdis Delay felt that the patient be discharged from a podiatry standpoint pending appropriate antibiotics from ID.  They recommended dressing changes every 2 to 3 days for the removal drainage and reassessment with outpatient plan for reevaluation in the coming weeks.  Given that he was on a heparin drip will change to Lovenox and then change him back to his p.o. apixaban today.  Patient was changed to p.o. Augmentin for 14 days and PICC line was removed.  Discharge Diagnoses:  Principal Problem:   Acute hematogenous osteomyelitis of right foot ([place]) Active Problems:   Malnutrition of moderate degree  Acute osteomyelitis of the left foot with abscess status post left fifth ray amputation and deep wound culture s/p I & D on 11/18/21 and repeat I & D on 11/22/21 -The patient was admitted to a medical bed. -We will continue antibiotic therapy with IV vancomycin but change IV Zosyn to IV cefepime.  We will continue his antibiotics for now and consult ID for further assistance with antibiotics upon discharge -Podiatry was consulted and Dr. Vickki Muff evaluated and he felt that the  patient will need an I&D and removal of all infected tissue and felt that this was likely need to be left open and packed; he also felt that this may lead to a second primary delayed primary closure down the road -He has been n.p.o. after midnight for surgical intervention later today -IV fluid  hydration. -Patient's WBC went from 10.7 -> 11.6 -> 12.9 -> 10.6 x2 -> 10.7 and slightly trended up to 12.1 -> 12.0 -Continue with IV antibiotics as above and continue monitor and follow-up on podiatry recommendations and podiatry recommends closely monitoring to make sure the erythema is improving; no [place] purulence was noted today but Dr. Vickki Muff did note a large amount of [place] purulent drainage in the operation -Podiatry recommends ID assistance so we will consult Dr. Delaine Lame and she is changed to Vanco cefepime to IV Cefazolin for now and will decide on duration and route once the pathology as well as the assessment of the wound with a podiatrist; now Dr. Steva Ready recommends changing to p.o. Augmentin at discharge -Podiatry evaluated and he had some moderate drainage with some slight dehiscence at the proximal aspect of the incision with brownish purulent drainage.  There are recommending adding Betadine and a sterile dressing  -Podiatry re-evaluated and took the patient for repeat I & D yesterday for washout and they feel the patient is stable from a podiatry standpoint and recommending discharge antibiotics per ID, Dr. Cleda Mccreedy and Dr. Delaine Lame Reviewed the wound together -Follow-up with podiatry in outpatient setting  Paroxysmal atrial fibrillation currently rate controlled ventricular response. -C/w Amiodarone 200 mg po Daily -Hold AC given Surgical Intervention and will resume once okayed by a podiatry standpoint and was held given that he underwent repeat I&D on Sunday; will change to heparin drip for now and continue to hold Oral AC; heparin drip was resumed yesterday but given that patient's IV access is not the best so we will change to subcu Lovenox and then transition back to his home apixaban at the time of discharge   Hypokalemia -Patient's potassium was 3.7 -Continue to monitor and replete as necessary -Repeat CMP in a.m.   Hypophosphatemia -Patient's Phos level is now  2.2 -Replete with IV K Phos 30 mmol yesterday and po K Phos 500 mg po x1 -Continue to monitor and replete as necessary -Repeat phosphorus level in a.m.   Abnormal LFTs -Slightly worsening initially but now improving  -Patient's AST peaked to 170 and is now improving to 106 -> 88 -> 56 -ALT peaked at 129 and is now improving to 35 but now slightly bumped to 74 had worsened to 113 and is now 19 -IVF Hydration now stopped  -Continue to monitor and trend hepatic function carefully and if necessary will obtain a right upper quadrant ultrasound as well as an acute hepatitis panel in the morning if continues to still be elevated and not improving -Repeat CMP in a.m.   Hypothyroidism. -Continue Levothyroxine. -Checked TSH 4.105  Dementia and Parkinson's Disease. -Continue home Sinemet IR 25-100 mg 1 tab p.o. 4 times daily as well as donepezil 10 mg p.o. daily -Has significant atrophy and muscle wasting in the setting of his Parkinson's disease -He was somnolent after Surgery  -Palliative consulted for goals of care discussion   Coronary artery disease. -Continue metoprolol tartrate 12.5 mg p.o. twice daily as well as Simvastatin 20 mg p.o. nightly  Dyslipidemia. -Continue simvastatin 20 mg p.o. nightly   Normocytic Anemia -Patient's hemoglobin/hematocrit went from 12.8/39.3 -> 12.1/37.0 -> 12.6/37.9 ->  13.8/42.3 -> 12.6/37.7 -> 13.3/40.0 and slightly dropped to 10.9/33.0  and is now 11.7/35.6  -Anemia panel was checked and showed an iron level of 57, U IBC 163, TIBC 220, saturation ratios of 26%, ferritin level of 510, folate level of 9.5, vitamin B12 548 -Continue to monitor for signs and symptoms bleeding; no overt bleeding noted -Repeat CBC in the a.m.   Moderate nonsevere malnutrition in the context of chronic illness -Nutritionist consulted for further evaluation recommendations -Estimated body mass index is 20.52 kg/m as calculated from the following:   Height as of this  encounter: 6' (1.829 m).   Weight as of this encounter: 68.6 kg. -Discharged recommending mighty shakes 3 times daily with meals as well as multivitamin with minerals daily as well as 500 mg of vitamin C twice daily and to 20 mg of zinc sulfate daily for 14 days and feeding assistance with meals  Discharge Instructions  Discharge Instructions     Call MD for:  difficulty breathing, headache or visual disturbances   Complete by: As directed    Call MD for:  extreme fatigue   Complete by: As directed    Call MD for:  hives   Complete by: As directed    Call MD for:  persistant dizziness or light-headedness   Complete by: As directed    Call MD for:  persistant nausea and vomiting   Complete by: As directed    Call MD for:  redness, tenderness, or signs of infection (pain, swelling, redness, odor or green/yellow discharge around incision site)   Complete by: As directed    Call MD for:  severe uncontrolled pain   Complete by: As directed    Call MD for:  temperature >100.4   Complete by: As directed    Change dressing (specify)   Complete by: As directed    May need dressing changes every 2 to 3 days for removal of drainage and reassessment.   Diet - low sodium heart healthy   Complete by: As directed    Discharge instructions   Complete by: As directed    You were cared for by a hospitalist during your hospital stay. If you have any questions about your discharge medications or the care you received while you were in the hospital after you are discharged, you can call the unit and ask to speak with the hospitalist on call if the hospitalist that took care of you is not available. Once you are discharged, your primary care physician will handle any further medical issues. Please note that NO REFILLS for any discharge medications will be authorized once you are discharged, as it is imperative that you return to your primary care physician (or establish a relationship with a primary care  physician if you do not have one) for your aftercare needs so that they can reassess your need for medications and monitor your lab values.  Follow up with PCP, ID, and Podiatry in the outpatient setting along with Palliative Care. Take all medications as prescribed. If symptoms change or worsen please return to the ED for evaluation   Discharge wound care:   Complete by: As directed    Per Podiatry Reccomendations   Increase activity slowly   Complete by: As directed       Allergies as of 11/24/2021       Reactions   No Known Allergies         Medication List     STOP taking these medications  ibuprofen 400 MG tablet Commonly known as: ADVIL   levofloxacin 500 MG tablet Commonly known as: LEVAQUIN   levofloxacin 750 MG tablet Commonly known as: LEVAQUIN   traMADol 50 MG tablet Commonly known as: ULTRAM   vancomycin 1 g injection Commonly known as: VANCOCIN   warfarin 3 MG tablet Commonly known as: COUMADIN       TAKE these medications    acetaminophen 325 MG tablet Commonly known as: TYLENOL Take 2 tablets (650 mg total) by mouth every 6 (six) hours as needed for mild pain (or Fever >/= 101). What changed:  when to take this reasons to take this   amiodarone 200 MG tablet Commonly known as: PACERONE Take 1 tablet (200 mg total) by mouth daily. Start taking on: November 25, 2021 What changed:  how much to take how to take this when to take this additional instructions Another medication with the same name was removed. Continue taking this medication, and follow the directions you see here.   amoxicillin-clavulanate 875-125 MG tablet Commonly known as: AUGMENTIN Take 1 tablet by mouth every 12 (twelve) hours for 14 days.   ascorbic acid 500 MG tablet Commonly known as: VITAMIN C Take 1 tablet (500 mg total) by mouth 2 (two) times daily.   carbidopa-levodopa 25-100 MG tablet Commonly known as: SINEMET IR Take 1 tablet by mouth 4 (four) times  daily. What changed:  when to take this additional instructions   donepezil 10 MG tablet Commonly known as: ARICEPT Take 10 mg by mouth daily.   HYDROcodone-acetaminophen 5-325 MG tablet Commonly known as: NORCO/VICODIN Take 1 tablet by mouth 3 (three) times daily as needed.   levothyroxine 100 MCG tablet Commonly known as: SYNTHROID Take 100 mcg by mouth daily. What changed: Another medication with the same name was removed. Continue taking this medication, and follow the directions you see here.   metoprolol tartrate 25 MG tablet Commonly known as: LOPRESSOR Take 0.5 tablets (12.5 mg total) by mouth 2 (two) times daily.   midodrine 5 MG tablet Commonly known as: PROAMATINE Take 1 tablet (5 mg total) by mouth 3 (three) times daily with meals.   multivitamin with minerals Tabs tablet Take 1 tablet by mouth daily. Start taking on: November 25, 2021   ondansetron 4 MG tablet Commonly known as: ZOFRAN Take 1 tablet (4 mg total) by mouth every 6 (six) hours as needed for nausea.   Polyvinyl Alcohol-Povidone 5-6 MG/ML Soln Place 1 drop into the left eye 4 (four) times daily as needed.   senna 8.6 MG Tabs tablet Commonly known as: SENOKOT Take 2 tablets by mouth at bedtime.   simvastatin 20 MG tablet Commonly known as: ZOCOR Take 1 tablet (20 mg total) by mouth daily. What changed: when to take this   Vitamin D (Ergocalciferol) 1.25 MG (50000 UNIT) Caps capsule Commonly known as: DRISDOL Take 50,000 Units by mouth every 7 (seven) days.   Xarelto 20 MG Tabs tablet Generic drug: rivaroxaban Take 20 mg by mouth daily.   zinc sulfate 220 (50 Zn) MG capsule Take 1 capsule (220 mg total) by mouth daily. Start taking on: November 25, 2021               Discharge Care Instructions  (From admission, onward)           Start     Ordered   11/24/21 0000  Discharge wound care:       Comments: Per Podiatry Reccomendations   11/24/21 1336  11/24/21 0000  Change  dressing (specify)       Comments: May need dressing changes every 2 to 3 days for removal of drainage and reassessment.   11/24/21 1336            Contact information for after-discharge care     [facility] .   Service: Skilled Nursing Contact information: [place] [phone]                    Allergies  Allergen Reactions   No Known Allergies    Consultations: Podiatry ID Palliative Care  Procedures/Studies: DG Foot Complete Left  Result Date: 11/17/2021 CLINICAL DATA:  Foot infection, wound EXAM: LEFT FOOT - COMPLETE 3+ VIEW COMPARISON:  None. FINDINGS: Soft tissue swelling and subcutaneous lucencies identified at the lateral aspect of the foot centered near the distal fifth metatarsal, consistent with soft tissue wound/infection. Destructive appearance of the fifth metatarsal head and base of the fifth proximal phalanx, with associated pathologic fracture at the metatarsal head. Bones are osteopenic. Mild degenerative changes at the first metatarsophalangeal joint noted. IMPRESSION: Evidence of soft tissue infection/wound of the lateral aspect of the foot with underlying acute osteomyelitis involving the distal fifth metatarsal and fifth proximal phalanx. Electronically Signed   By: Ofilia Neas M.D.   On: 11/17/2021 14:49   DG MINI C-ARM IMAGE ONLY  Result Date: 11/18/2021 There is no interpretation for this exam.  This order is for images obtained during a surgical procedure.  Please See "Surgeries" Tab for more information regarding the procedure.     Subjective: And examined at bedside he is resting in no acute distress.  Right knee pain.  No other concerns or complaints this time but still has significant contractures and leaning towards the left side.  Discharge Exam: Vitals:   11/24/21 0516 11/24/21 0735  BP: 103/70 106/62  Pulse: (!) 59 (!) 52  Resp: 18 18   Temp: 97.8 F (36.6 C) 98.5 F (36.9 C)  SpO2: 97% 97%   Vitals:   11/23/21 1959 11/23/21 2348 11/24/21 0516 11/24/21 0735  BP: 106/62 123/72 103/70 106/62  Pulse: 72 67 (!) 59 (!) 52  Resp: 16 12 18 18   Temp: 98.2 F (36.8 C)  97.8 F (36.6 C) 98.5 F (36.9 C)  TempSrc:   Oral Axillary  SpO2: 99% 96% 97% 97%  Weight:      Height:       General: Pt is alert, awake, not in acute distress and has some tremors from his Parkinson's  Cardiovascular: RRR, S1/S2 +, no rubs, no gallops Respiratory: Diminished bilaterally, no wheezing, no rhonchi Abdominal: Soft, NT, ND, bowel sounds + Extremities: no edema but left foot is wrapped, no cyanosis; has significant amount of atrophy and contractures in the setting of his Parkinson's  The results of significant diagnostics from this hospitalization (including imaging, microbiology, ancillary and laboratory) are listed below for reference.    Microbiology: Recent Results (from the past 240 hour(s))  Blood Culture (routine x 2)     Status: None   Collection Time: 11/17/21  2:06 PM   Specimen: BLOOD  Result Value Ref Range Status   Specimen Description BLOOD PICC LINE  Final   Special Requests BOTTLES DRAWN AEROBIC AND ANAEROBIC BCAV  Final   Culture   Final    NO GROWTH 5 DAYS Performed at [facility], [address] [place],  [place] [address]    Report Status 11/22/2021 FINAL  Final  Resp Panel by RT-PCR (Flu A&B, Covid) Nasopharyngeal Swab     Status: None   Collection Time: 11/17/21  2:42 PM   Specimen: Nasopharyngeal Swab; Nasopharyngeal(NP) swabs in vial transport medium  Result Value Ref Range Status   SARS Coronavirus 2 by RT PCR NEGATIVE NEGATIVE Final    Comment: (NOTE) SARS-CoV-2 target nucleic acids are NOT DETECTED.  The SARS-CoV-2 RNA is generally detectable in upper respiratory specimens during the acute phase of infection. The lowest concentration of SARS-CoV-2 viral copies this assay can detect is 138  copies/mL. A negative result does not preclude SARS-Cov-2 infection and should not be used as the sole basis for treatment or other patient management decisions. A negative result may occur with  improper specimen collection/handling, submission of specimen other than nasopharyngeal swab, presence of viral mutation(s) within the areas targeted by this assay, and inadequate number of viral copies(<138 copies/mL). A negative result must be combined with clinical observations, patient history, and epidemiological information. The expected result is Negative.  Fact Sheet for Patients:  EntrepreneurPulse.com.au  Fact Sheet for Healthcare Providers:  IncredibleEmployment.be  This test is no t yet approved or cleared by the [place] FDA and  has been authorized for detection and/or diagnosis of SARS-CoV-2 by FDA under an Emergency Use Authorization (EUA). This EUA will remain  in effect (meaning this test can be used) for the duration of the COVID-19 declaration under Section 564(b)(1) of the Act, 21 U.S.C.section 360bbb-3(b)(1), unless the authorization is terminated  or revoked sooner.       Influenza A by PCR NEGATIVE NEGATIVE Final   Influenza B by PCR NEGATIVE NEGATIVE Final    Comment: (NOTE) The Xpert Xpress SARS-CoV-2/FLU/RSV plus assay is intended as an aid in the diagnosis of influenza from Nasopharyngeal swab specimens and should not be used as a sole basis for treatment. Nasal washings and aspirates are unacceptable for Xpert Xpress SARS-CoV-2/FLU/RSV testing.  Fact Sheet for Patients: EntrepreneurPulse.com.au  Fact Sheet for Healthcare Providers: IncredibleEmployment.be  This test is not yet approved or cleared by the [place] FDA and has been authorized for detection and/or diagnosis of SARS-CoV-2 by FDA under an Emergency Use Authorization (EUA). This EUA will remain in effect (meaning  this test can be used) for the duration of the COVID-19 declaration under Section 564(b)(1) of the Act, 21 U.S.C. section 360bbb-3(b)(1), unless the authorization is terminated or revoked.  Performed at [facility], [address] [place], [place] [address]   Blood Culture (routine x 2)     Status: None   Collection Time: 11/17/21  2:42 PM   Specimen: BLOOD  Result Value Ref Range Status   Specimen Description BLOOD ARM  Final   Special Requests BOTTLES DRAWN AEROBIC AND ANAEROBIC BCAV  Final   Culture   Final    NO GROWTH 5 DAYS Performed at [facility], [address], [place], [place] [address]    Report Status 11/22/2021 FINAL  Final  Aerobic/Anaerobic Culture w Gram Stain (surgical/deep wound)     Status: None   Collection Time: 11/17/21  6:33 PM   Specimen: Foot; Abscess  Result Value Ref Range Status   Specimen Description   Final    FOOT Performed at [facility], [address] [place], [place] [address]    Special Requests   Final    LEFT Performed at [facility], [place], [place]  [address]    Gram Stain   Final    MODERATE WBC PRESENT,BOTH PMN AND MONONUCLEAR MODERATE GRAM POSITIVE COCCI RARE GRAM NEGATIVE RODS    Culture   Final    FEW STAPHYLOCOCCUS AUREUS NO ANAEROBES ISOLATED Performed at [facility], [address], [place], [place] [address]    Report Status 11/23/2021 FINAL  Final   Organism ID, Bacteria STAPHYLOCOCCUS AUREUS  Final      Susceptibility   Staphylococcus aureus - MIC*    CIPROFLOXACIN >=8 RESISTANT Resistant     ERYTHROMYCIN >=8 RESISTANT Resistant     GENTAMICIN <=0.5 SENSITIVE Sensitive     OXACILLIN 0.5 SENSITIVE Sensitive     TETRACYCLINE <=1 SENSITIVE Sensitive     VANCOMYCIN 1 SENSITIVE Sensitive     TRIMETH/SULFA <=10 SENSITIVE Sensitive     CLINDAMYCIN <=0.25 SENSITIVE Sensitive     RIFAMPIN <=0.5 SENSITIVE Sensitive     Inducible Clindamycin NEGATIVE  Sensitive     * FEW STAPHYLOCOCCUS AUREUS  Aerobic/Anaerobic Culture w Gram Stain (surgical/deep wound)     Status: None   Collection Time: 11/18/21  3:06 PM   Specimen: PATH Other; Tissue  Result Value Ref Range Status   Specimen Description   Final    ABSCESS Performed at [facility], [address] [place], [place] [address]    Special Requests   Final    LEFT FOOT Performed at [facility], [address] [place], [place] [address]    Gram Stain   Final    NO SQUAMOUS EPITHELIAL CELLS SEEN FEW WBC SEEN FEW GRAM POSITIVE COCCI    Culture   Final    FEW STAPHYLOCOCCUS AUREUS NO ANAEROBES ISOLATED Performed at [facility], [place] [address] [place], [place] [address]    Report Status 11/23/2021 FINAL  Final   Organism ID, Bacteria STAPHYLOCOCCUS AUREUS  Final      Susceptibility   Staphylococcus aureus - MIC*    CIPROFLOXACIN >=8 RESISTANT Resistant     ERYTHROMYCIN >=8 RESISTANT Resistant     GENTAMICIN <=0.5 SENSITIVE Sensitive     OXACILLIN 0.5 SENSITIVE Sensitive     TETRACYCLINE <=1 SENSITIVE Sensitive     VANCOMYCIN <=0.5 SENSITIVE Sensitive     TRIMETH/SULFA <=10 SENSITIVE Sensitive     CLINDAMYCIN <=0.25 SENSITIVE Sensitive     RIFAMPIN <=0.5 SENSITIVE Sensitive     Inducible Clindamycin NEGATIVE Sensitive     * FEW STAPHYLOCOCCUS AUREUS    Labs: BNP (last 3 results) No results for input(s): BNP in the last 8760 hours. Basic Metabolic Panel: Recent Labs  Lab 11/20/21 0722 11/21/21 0824 11/22/21 0624 11/23/21 0605 11/24/21 0403  NA 139 137 137 138 137  K 3.8 4.3 4.1 3.8 3.7  CL 103 104 103 104 104  CO2 30 26 26 29 29   GLUCOSE 113* 82 88 122* 90  BUN 12 10 11 12 9   CREATININE 0.66 0.45* 0.59* 0.71 0.53*  CALCIUM 8.5* 8.5* 8.6* 8.1* 8.2*  MG 2.2 2.1 2.1 1.9 2.0  PHOS 2.0* 2.3* 2.3* 1.7* 2.2*   Liver Function Tests: Recent Labs  Lab 11/20/21 0722 11/21/21 0824 11/22/21 0624 11/23/21 0605 11/24/21 0403  AST 149*  106* 88* 68* 56*  ALT 73* 35 74* 113* 19  ALKPHOS 72 66 69 61 62  BILITOT 0.8 0.6 0.9 0.5 0.5  PROT 6.3* 5.8* 6.3* 5.3* 5.7*  ALBUMIN 2.7* 2.5* 2.7* 2.2* 2.4*   No results for input(s): LIPASE, AMYLASE in  the last 168 hours. No results for input(s): AMMONIA in the last 168 hours. CBC: Recent Labs  Lab 11/20/21 0722 11/21/21 1002 11/22/21 0624 11/23/21 0605 11/24/21 0403  WBC 10.6* 10.6* 10.7* 12.1* 12.0*  NEUTROABS 7.4 7.6 7.6 9.0* 7.6  HGB 13.8 12.6* 13.3 10.9* 11.7*  HCT 42.3 37.7* 40.0 33.0* 35.6*  MCV 94.2 94.7 93.2 93.0 94.4  PLT 290 244 184 233 240   Cardiac Enzymes: No results for input(s): CKTOTAL, CKMB, CKMBINDEX, TROPONINI in the last 168 hours. BNP: Invalid input(s): POCBNP CBG: Recent Labs  Lab 11/18/21 1849 11/18/21 2013  GLUCAP 86 96   D-Dimer No results for input(s): DDIMER in the last 72 hours. Hgb A1c No results for input(s): HGBA1C in the last 72 hours. Lipid Profile No results for input(s): CHOL, HDL, LDLCALC, TRIG, CHOLHDL, LDLDIRECT in the last 72 hours. Thyroid function studies No results for input(s): TSH, T4TOTAL, T3FREE, THYROIDAB in the last 72 hours.  Invalid input(s): FREET3 Anemia work up No results for input(s): VITAMINB12, FOLATE, FERRITIN, TIBC, IRON, RETICCTPCT in the last 72 hours. Urinalysis    Component Value Date/Time   COLORURINE YELLOW (A) 04/04/2020 0601   APPEARANCEUR CLEAR (A) 04/04/2020 0601   LABSPEC 1.014 04/04/2020 0601   PHURINE 5.0 04/04/2020 0601   GLUCOSEU NEGATIVE 04/04/2020 0601   HGBUR NEGATIVE 04/04/2020 0601   BILIRUBINUR NEGATIVE 04/04/2020 0601   KETONESUR 20 (A) 04/04/2020 0601   PROTEINUR NEGATIVE 04/04/2020 0601   NITRITE NEGATIVE 04/04/2020 0601   LEUKOCYTESUR TRACE (A) 04/04/2020 0601   Sepsis Labs Invalid input(s): PROCALCITONIN,  WBC,  LACTICIDVEN Microbiology Recent Results (from the past 240 hour(s))  Blood Culture (routine x 2)     Status: None   Collection Time: 11/17/21  2:06 PM    Specimen: BLOOD  Result Value Ref Range Status   Specimen Description BLOOD PICC LINE  Final   Special Requests BOTTLES DRAWN AEROBIC AND ANAEROBIC BCAV  Final   Culture   Final    NO GROWTH 5 DAYS Performed at [facility], [address] [place], [place] [address]    Report Status 11/22/2021 FINAL  Final  Resp Panel by RT-PCR (Flu A&B, Covid) Nasopharyngeal Swab     Status: None   Collection Time: 11/17/21  2:42 PM   Specimen: Nasopharyngeal Swab; Nasopharyngeal(NP) swabs in vial transport medium  Result Value Ref Range Status   SARS Coronavirus 2 by RT PCR NEGATIVE NEGATIVE Final    Comment: (NOTE) SARS-CoV-2 target nucleic acids are NOT DETECTED.  The SARS-CoV-2 RNA is generally detectable in upper respiratory specimens during the acute phase of infection. The lowest concentration of SARS-CoV-2 viral copies this assay can detect is 138 copies/mL. A negative result does not preclude SARS-Cov-2 infection and should not be used as the sole basis for treatment or other patient management decisions. A negative result may occur with  improper specimen collection/handling, submission of specimen other than nasopharyngeal swab, presence of viral mutation(s) within the areas targeted by this assay, and inadequate number of viral copies(<138 copies/mL). A negative result must be combined with clinical observations, patient history, and epidemiological information. The expected result is Negative.  Fact Sheet for Patients:  EntrepreneurPulse.com.au  Fact Sheet for Healthcare Providers:  IncredibleEmployment.be  This test is no t yet approved or cleared by the [place] FDA and  has been authorized for detection and/or diagnosis of SARS-CoV-2 by FDA under an Emergency Use Authorization (EUA). This EUA will remain  in effect (meaning this test can  be used) for the duration of the COVID-19 declaration under Section 564(b)(1) of the Act,  21 U.S.C.section 360bbb-3(b)(1), unless the authorization is terminated  or revoked sooner.       Influenza A by PCR NEGATIVE NEGATIVE Final   Influenza B by PCR NEGATIVE NEGATIVE Final    Comment: (NOTE) The Xpert Xpress SARS-CoV-2/FLU/RSV plus assay is intended as an aid in the diagnosis of influenza from Nasopharyngeal swab specimens and should not be used as a sole basis for treatment. Nasal washings and aspirates are unacceptable for Xpert Xpress SARS-CoV-2/FLU/RSV testing.  Fact Sheet for Patients: EntrepreneurPulse.com.au  Fact Sheet for Healthcare Providers: IncredibleEmployment.be  This test is not yet approved or cleared by the [place] FDA and has been authorized for detection and/or diagnosis of SARS-CoV-2 by FDA under an Emergency Use Authorization (EUA). This EUA will remain in effect (meaning this test can be used) for the duration of the COVID-19 declaration under Section 564(b)(1) of the Act, 21 U.S.C. section 360bbb-3(b)(1), unless the authorization is terminated or revoked.  Performed at [facility], [address] [place], [place] [address]   Blood Culture (routine x 2)     Status: None   Collection Time: 11/17/21  2:42 PM   Specimen: BLOOD  Result Value Ref Range Status   Specimen Description BLOOD ARM  Final   Special Requests BOTTLES DRAWN AEROBIC AND ANAEROBIC BCAV  Final   Culture   Final    NO GROWTH 5 DAYS Performed at [facility], [address] [place], [place] [address]    Report Status 11/22/2021 FINAL  Final  Aerobic/Anaerobic Culture w Gram Stain (surgical/deep wound)     Status: None   Collection Time: 11/17/21  6:33 PM   Specimen: Foot; Abscess  Result Value Ref Range Status   Specimen Description   Final    FOOT Performed at [facility], [address], [place], [place] [address]    Special Requests   Final    LEFT Performed at [facility], [address] [place], [place] [address]    Gram Stain   Final    MODERATE WBC PRESENT,BOTH PMN AND MONONUCLEAR MODERATE GRAM POSITIVE COCCI RARE GRAM NEGATIVE RODS    Culture   Final    FEW STAPHYLOCOCCUS AUREUS NO ANAEROBES ISOLATED Performed at [facility], [place] [address] [place],  [address]    Report Status 11/23/2021 FINAL  Final   Organism ID, Bacteria STAPHYLOCOCCUS AUREUS  Final      Susceptibility   Staphylococcus aureus - MIC*    CIPROFLOXACIN >=8 RESISTANT Resistant     ERYTHROMYCIN >=8 RESISTANT Resistant     GENTAMICIN <=0.5 SENSITIVE Sensitive     OXACILLIN 0.5 SENSITIVE Sensitive     TETRACYCLINE <=1 SENSITIVE Sensitive     VANCOMYCIN 1 SENSITIVE Sensitive     TRIMETH/SULFA <=10 SENSITIVE Sensitive     CLINDAMYCIN <=0.25 SENSITIVE Sensitive     RIFAMPIN <=0.5 SENSITIVE Sensitive     Inducible Clindamycin NEGATIVE Sensitive     * FEW STAPHYLOCOCCUS AUREUS  Aerobic/Anaerobic Culture w Gram Stain (surgical/deep wound)     Status: None   Collection Time: 11/18/21  3:06 PM   Specimen: PATH Other; Tissue  Result Value Ref Range Status   Specimen Description   Final    ABSCESS Performed at [facility], [address], [place],  [address]    Special Requests   Final    LEFT FOOT Performed at  [place], [place] [address]    Gram Stain   Final    NO SQUAMOUS EPITHELIAL CELLS SEEN FEW WBC SEEN FEW GRAM POSITIVE COCCI    Culture   Final    FEW STAPHYLOCOCCUS AUREUS NO ANAEROBES ISOLATED Performed at [facility], [place] [address], [place],  [address]    Report Status 11/23/2021 FINAL  Final   Organism ID, Bacteria STAPHYLOCOCCUS AUREUS  Final      Susceptibility   Staphylococcus aureus - MIC*    CIPROFLOXACIN >=8 RESISTANT Resistant     ERYTHROMYCIN >=8 RESISTANT Resistant     GENTAMICIN <=0.5 SENSITIVE Sensitive     OXACILLIN 0.5 SENSITIVE Sensitive     TETRACYCLINE <=1  SENSITIVE Sensitive     VANCOMYCIN <=0.5 SENSITIVE Sensitive     TRIMETH/SULFA <=10 SENSITIVE Sensitive     CLINDAMYCIN <=0.25 SENSITIVE Sensitive     RIFAMPIN <=0.5 SENSITIVE Sensitive     Inducible Clindamycin NEGATIVE Sensitive     * FEW STAPHYLOCOCCUS AUREUS   Time coordinating discharge: 35 minutes  SIGNED:  Kerney Elbe, DO [facility] 11/24/2021, 1:37 PM Pager is on AMION  If 7PM-7AM, please contact night-coverage [email or web address]

## 2021-11-24 NOTE — Progress Notes (Signed)
[facility]) Hospital Liaison note:  This patient is currently enrolled in [facility] outpatient-based Palliative Care. Will continue to follow for disposition.  Please call with any outpatient palliative questions or concerns.  Thank you, Lorelee Market, LPN [facility] Liaison [phone]

## 2021-11-24 NOTE — Care Management Important Message (Signed)
Important Message  Patient Details  Name: Douglas Silva. MRN: [id number] Date of Birth: [birth year]   Medicare Important Message Given:  Yes     Juliann Pulse A Aziel Morgan 11/24/2021, 3:08 PM

## 2021-11-24 NOTE — Progress Notes (Addendum)
The patient to go to [facility] room 66 today, his sister has been called and made aware, EMS calle dfor transport

## 2022-02-22 DEATH — deceased
